# Patient Record
Sex: Female | Born: 1990 | Race: Black or African American | Hispanic: No | Marital: Married | Smoking: Never smoker
Health system: Southern US, Community
[De-identification: ages and names within clinical notes are randomized; demographics above are authoritative.]

## PROBLEM LIST (undated history)

## (undated) DIAGNOSIS — I499 Cardiac arrhythmia, unspecified: Secondary | ICD-10-CM

## (undated) DIAGNOSIS — Q250 Patent ductus arteriosus: Secondary | ICD-10-CM

## (undated) DIAGNOSIS — I10 Essential (primary) hypertension: Secondary | ICD-10-CM

## (undated) DIAGNOSIS — G4730 Sleep apnea, unspecified: Secondary | ICD-10-CM

## (undated) DIAGNOSIS — R011 Cardiac murmur, unspecified: Secondary | ICD-10-CM

## (undated) DIAGNOSIS — J45909 Unspecified asthma, uncomplicated: Secondary | ICD-10-CM

## (undated) DIAGNOSIS — O24419 Gestational diabetes mellitus in pregnancy, unspecified control: Secondary | ICD-10-CM

## (undated) HISTORY — DX: Patent ductus arteriosus: Q25.0

## (undated) HISTORY — DX: Essential (primary) hypertension: I10

## (undated) HISTORY — DX: Unspecified asthma, uncomplicated: J45.909

---

## 2011-05-22 ENCOUNTER — Inpatient Hospital Stay (INDEPENDENT_AMBULATORY_CARE_PROVIDER_SITE_OTHER)
Admission: RE | Admit: 2011-05-22 | Discharge: 2011-05-22 | Disposition: A | Discharge status: Home / Self Care | Payer: Self-pay | Source: Ambulatory Visit | Attending: Family Medicine | Admitting: Family Medicine

## 2011-05-22 DIAGNOSIS — J029 Acute pharyngitis, unspecified: Secondary | ICD-10-CM

## 2011-05-22 LAB — POCT RAPID STREP A: Streptococcus, Group A Screen (Direct): NEGATIVE

## 2011-05-22 LAB — POCT INFECTIOUS MONO SCREEN: Mono Screen: NEGATIVE

## 2011-08-10 ENCOUNTER — Ambulatory Visit: Payer: 59 | Attending: Family Medicine

## 2011-08-10 DIAGNOSIS — G4733 Obstructive sleep apnea (adult) (pediatric): Secondary | ICD-10-CM | POA: Insufficient documentation

## 2011-08-10 DIAGNOSIS — Z6836 Body mass index (BMI) 36.0-36.9, adult: Secondary | ICD-10-CM | POA: Insufficient documentation

## 2011-08-31 NOTE — Procedures (Signed)
Nicole Stuart, Nicole Stuart                ACCOUNT NO.:  [id number]  MEDICAL RECORD NO.:  [id number]          PATIENT TYPE:  OUT  LOCATION:  SLEEP LAB                     FACILITY:  [facility]  PHYSICIAN:  Scotty Weigelt A. Gerilyn Pilgrim, M.D. DATE OF BIRTH:  1990/10/06  DATE OF STUDY:  08/10/2011                           NOCTURNAL POLYSOMNOGRAM  REFERRING PHYSICIAN:  Renaye Rakers, M.D.  INDICATION:  21 year old who presents with snoring, fatigue and witnessed apnea.  The study is being done to evaluate for obstructive sleep apnea syndrome.  MEDICATIONS:  None.  Epworth Sleepiness Scale 4.  BMI 36.  ARCHITECTURAL SUMMARY:  This a split night recording with initial portion being a diagnostic and the second portion a titration recording. The total recording time is 439 minutes. Sleep efficiency 65%.  Sleep latency 22 minutes.  REM latency 324 minutes.  Risk factors; baseline oxygen saturation is 94, lowest saturation 85 during non-REM sleep. Diagnostic AHI is 17.3.  The patient was placed on positive pressure and titrated between 5 and 10.  She actually did well on low pressures of 7- 9 and pressure obtained she developed central apnea, optimal pressure is therefore 8.  Limb movements summary:  PLM index 0.     Electrocardiogram summary: The average heart rate is 84 with no significant dysrhythmias observed.  IMPRESSION:  Mild to moderate obstructive sleep apnea syndrome, which responds well to a CPAP of 8.  Thanks for this referral.   Dhruvi Crenshaw A. Gerilyn Pilgrim, M.D.    KAD/MEDQ  D:  08/31/2011 10:23:30  T:  08/31/2011 10:41:51  Job:  [id number]

## 2011-09-04 ENCOUNTER — Encounter (HOSPITAL_COMMUNITY): Payer: Self-pay | Admitting: Pharmacy Technician

## 2011-09-15 ENCOUNTER — Encounter (HOSPITAL_COMMUNITY): Payer: Self-pay

## 2011-09-15 ENCOUNTER — Encounter (HOSPITAL_COMMUNITY)
Admission: RE | Admit: 2011-09-15 | Discharge: 2011-09-15 | Disposition: A | Discharge status: Home / Self Care | Payer: 59 | Source: Ambulatory Visit | Attending: Otolaryngology | Admitting: Otolaryngology

## 2011-09-15 HISTORY — DX: Sleep apnea, unspecified: G47.30

## 2011-09-15 HISTORY — DX: Cardiac arrhythmia, unspecified: I49.9

## 2011-09-15 HISTORY — DX: Cardiac murmur, unspecified: R01.1

## 2011-09-15 LAB — CBC
Hemoglobin: 12.5 g/dL (ref 12.0–15.0)
MCHC: 35.6 g/dL (ref 30.0–36.0)
RDW: 15.2 % (ref 11.5–15.5)
WBC: 12.3 10*3/uL — ABNORMAL HIGH (ref 4.0–10.5)

## 2011-09-15 LAB — SURGICAL PCR SCREEN
MRSA, PCR: NEGATIVE
Staphylococcus aureus: NEGATIVE

## 2011-09-15 LAB — HCG, SERUM, QUALITATIVE: Preg, Serum: NEGATIVE

## 2011-09-15 NOTE — Pre-Procedure Instructions (Signed)
18 Nicole Stuart  09/15/2011   Your procedure is scheduled on:  09/22/11  Report to [facility] at 0530 AM.  Call this number if you have problems the morning of surgery: [phone]   Remember:   Do not eat food:After Midnight.  May have clear liquids: up to 4 Hours before arrival.  Clear liquids include soda, tea, black coffee, apple or grape juice, broth.  Take these medicines the morning of surgery with A SIP OF WATER: none   Do not wear jewelry, make-up or nail polish.  Do not wear lotions, powders, or perfumes. You may wear deodorant.  Do not shave 48 hours prior to surgery.  Do not bring valuables to the hospital.  Contacts, dentures or bridgework may not be worn into surgery.  Leave suitcase in the car. After surgery it may be brought to your room.  For patients admitted to the hospital, checkout time is 11:00 AM the day of discharge.   Patients discharged the day of surgery will not be allowed to drive home.  Name and phone number of your driver: Dewayne Hatch- mother- [phone]  Special Instructions: CHG Shower Use Special Wash: 1/2 bottle night before surgery and 1/2 bottle morning of surgery.   Please read over the following fact sheets that you were given: Pain Booklet, Coughing and Deep Breathing, MRSA Information and Surgical Site Infection Prevention

## 2011-09-15 NOTE — Progress Notes (Signed)
Contacted Dr. Allene Pyo office, spoke with Amy requested orders for PAT

## 2011-09-21 NOTE — H&P (Signed)
PREOPERATIVE H&P  Chief Complaint: large tonsils and sleep apnea  HPI: Nicole Stuart is a 21 y.o. female who presents for evaluation of tonsils and sleep apnea. She's always had large tonsils with a history of sore throats as well as recurrent strept infections. She also has trouble breathing through the left side of her nose. She recently had a sleep test that demonstrated mild to moderate sleep apnea. She is taken to the OR at this time for tonsillectomy, septoplasty and turbinate reductions.  Past Medical History  Diagnosis Date  . Dysrhythmia     first degree heart block.   Marland Kitchen Heart murmur   . Sleep apnea     study done two weeks ago [place]    No past surgical history on file. History   Social History  . Marital Status: Single    Spouse Name: N/A    Number of Children: N/A  . Years of Education: N/A   Social History Main Topics  . Smoking status: Never Smoker   . Smokeless tobacco: Not on file  . Alcohol Use: No  . Drug Use: No  . Sexually Active: Yes   Other Topics Concern  . Not on file   Social History Narrative  . No narrative on file   No family history on file. No Known Allergies Prior to Admission medications   Medication Sig Start Date End Date Taking? Authorizing Provider  amoxicillin-clavulanate (AUGMENTIN) 875-125 MG per tablet Take 1 tablet by mouth 2 (two) times daily. For 10 days; Start date 09/03/11   Yes Historical Provider, MD  ibuprofen (ADVIL,MOTRIN) 600 MG tablet Take 600 mg by mouth every 6 (six) hours as needed. For pain   Yes Historical Provider, MD     Positive ROS: trouble breathing through the L side of nose.  All other systems have been reviewed and were otherwise negative with the exception of those mentioned in the HPI and as above.  Physical Exam: There were no vitals filed for this visit.  General: Alert, no acute distress Oral: Normal oral mucosa and 3+ tonsils bilaterally Nasal: septal deviation to the left with turbinate  enlargement Neck: No palpable adenopathy or thyroid nodules Ear: Ear canal is clear with normal appearing TMs Cardiovascular: Regular rate and rhythm, no murmur.  Respiratory: Clear to auscultation Neurologic: Alert and oriented x 3   Assessment/Plan: obstructive sleep apnea, turbinate hypertrophy, acute tonsillitis, septal deviation to the left Plan for Procedure(s): NASAL SEPTOPLASTY WITH TURBINATE REDUCTION TONSILLECTOMY   Joeanna Howdyshell E, MD 09/21/2011 6:02 PM

## 2011-09-22 ENCOUNTER — Encounter (HOSPITAL_COMMUNITY)
Admission: RE | Disposition: A | Discharge status: Home / Self Care | Payer: Self-pay | Source: Ambulatory Visit | Attending: Otolaryngology

## 2011-09-22 ENCOUNTER — Ambulatory Visit (HOSPITAL_COMMUNITY): Payer: 59 | Admitting: Certified Registered"

## 2011-09-22 ENCOUNTER — Ambulatory Visit (HOSPITAL_COMMUNITY)
Admission: RE | Admit: 2011-09-22 | Discharge: 2011-09-23 | Disposition: A | Discharge status: Home / Self Care | Payer: 59 | Source: Ambulatory Visit | Attending: Otolaryngology | Admitting: Otolaryngology

## 2011-09-22 ENCOUNTER — Encounter (HOSPITAL_COMMUNITY): Payer: Self-pay | Admitting: *Deleted

## 2011-09-22 ENCOUNTER — Other Ambulatory Visit: Payer: Self-pay | Admitting: Otolaryngology

## 2011-09-22 ENCOUNTER — Encounter (HOSPITAL_COMMUNITY): Payer: Self-pay | Admitting: Certified Registered"

## 2011-09-22 DIAGNOSIS — J342 Deviated nasal septum: Secondary | ICD-10-CM | POA: Insufficient documentation

## 2011-09-22 DIAGNOSIS — Z01812 Encounter for preprocedural laboratory examination: Secondary | ICD-10-CM | POA: Insufficient documentation

## 2011-09-22 DIAGNOSIS — J3501 Chronic tonsillitis: Secondary | ICD-10-CM | POA: Insufficient documentation

## 2011-09-22 DIAGNOSIS — G4733 Obstructive sleep apnea (adult) (pediatric): Secondary | ICD-10-CM | POA: Insufficient documentation

## 2011-09-22 HISTORY — PX: TONSILLECTOMY: SHX5217

## 2011-09-22 HISTORY — PX: NASAL SEPTOPLASTY W/ TURBINOPLASTY: SHX2070

## 2011-09-22 SURGERY — SEPTOPLASTY, NOSE, WITH NASAL TURBINATE REDUCTION
Anesthesia: General | Site: Nose | Laterality: Bilateral | Wound class: Clean Contaminated

## 2011-09-22 MED ORDER — HYDROCODONE-ACETAMINOPHEN 7.5-500 MG/15ML PO SOLN
15.0000 mL | ORAL | Status: DC | PRN
  Administered 2011-09-22: 15 mL via ORAL
  Administered 2011-09-23: 20 mL via ORAL
  Administered 2011-09-23 (×2): 15 mL via ORAL
  Filled 2011-09-22: qty 30
  Filled 2011-09-22 (×4): qty 15

## 2011-09-22 MED ORDER — DEXAMETHASONE SODIUM PHOSPHATE 4 MG/ML IJ SOLN
INTRAMUSCULAR | Status: DC | PRN
  Administered 2011-09-22: 10 mg via INTRAVENOUS

## 2011-09-22 MED ORDER — GLYCOPYRROLATE 0.2 MG/ML IJ SOLN
INTRAMUSCULAR | Status: DC | PRN
  Administered 2011-09-22: 0.2 mg via INTRAVENOUS

## 2011-09-22 MED ORDER — DROPERIDOL 2.5 MG/ML IJ SOLN
INTRAMUSCULAR | Status: DC | PRN
  Administered 2011-09-22: 0.625 mg via INTRAVENOUS

## 2011-09-22 MED ORDER — CEFAZOLIN SODIUM 1-5 GM-% IV SOLN
INTRAVENOUS | Status: AC
  Filled 2011-09-22: qty 100

## 2011-09-22 MED ORDER — OXYMETAZOLINE HCL 0.05 % NA SOLN
NASAL | Status: DC | PRN
  Administered 2011-09-22: 1

## 2011-09-22 MED ORDER — SUFENTANIL CITRATE 50 MCG/ML IV SOLN
INTRAVENOUS | Status: DC | PRN
  Administered 2011-09-22: 20 ug via INTRAVENOUS
  Administered 2011-09-22: 10 ug via INTRAVENOUS
  Administered 2011-09-22: 20 ug via INTRAVENOUS

## 2011-09-22 MED ORDER — MIDAZOLAM HCL 5 MG/5ML IJ SOLN
INTRAMUSCULAR | Status: DC | PRN
  Administered 2011-09-22: 2 mg via INTRAVENOUS

## 2011-09-22 MED ORDER — DROPERIDOL 2.5 MG/ML IJ SOLN: 0.6250 mg | INTRAMUSCULAR | Status: DC | PRN

## 2011-09-22 MED ORDER — DEXTROSE 5 % IV SOLN
1.0000 g | INTRAVENOUS | Status: DC | PRN
  Administered 2011-09-22: 2 g via INTRAVENOUS

## 2011-09-22 MED ORDER — ACETAMINOPHEN 650 MG RE SUPP: 650.0000 mg | RECTAL | Status: DC | PRN

## 2011-09-22 MED ORDER — PROPOFOL 10 MG/ML IV EMUL
INTRAVENOUS | Status: DC | PRN
  Administered 2011-09-22: 200 mg via INTRAVENOUS
  Administered 2011-09-22: 20 mg via INTRAVENOUS

## 2011-09-22 MED ORDER — CEFAZOLIN SODIUM 1-5 GM-% IV SOLN
1.0000 g | Freq: Three times a day (TID) | INTRAVENOUS | Status: DC
  Administered 2011-09-22 – 2011-09-23 (×3): 1 g via INTRAVENOUS
  Filled 2011-09-22 (×5): qty 50

## 2011-09-22 MED ORDER — ACETAMINOPHEN 160 MG/5ML PO SOLN: 650.0000 mg | ORAL | Status: DC | PRN

## 2011-09-22 MED ORDER — MORPHINE SULFATE 2 MG/ML IJ SOLN
2.0000 mg | INTRAMUSCULAR | Status: DC | PRN
  Administered 2011-09-22 (×3): 2 mg via INTRAVENOUS
  Filled 2011-09-22 (×3): qty 1

## 2011-09-22 MED ORDER — ONDANSETRON HCL 4 MG/2ML IJ SOLN: 4.0000 mg | INTRAMUSCULAR | Status: DC | PRN

## 2011-09-22 MED ORDER — KCL IN DEXTROSE-NACL 20-5-0.45 MEQ/L-%-% IV SOLN
INTRAVENOUS | Status: DC
  Administered 2011-09-22 – 2011-09-23 (×2): via INTRAVENOUS
  Filled 2011-09-22 (×4): qty 1000

## 2011-09-22 MED ORDER — LIDOCAINE HCL 4 % MT SOLN
OROMUCOSAL | Status: DC | PRN
  Administered 2011-09-22: 4 mL via TOPICAL

## 2011-09-22 MED ORDER — HYDROMORPHONE HCL PF 1 MG/ML IJ SOLN: 0.2500 mg | INTRAMUSCULAR | Status: DC | PRN

## 2011-09-22 MED ORDER — PHENOL 1.4 % MT LIQD
1.0000 | OROMUCOSAL | Status: DC | PRN
  Administered 2011-09-22: 1 via OROMUCOSAL
  Filled 2011-09-22: qty 177

## 2011-09-22 MED ORDER — BACITRACIN ZINC 500 UNIT/GM EX OINT
TOPICAL_OINTMENT | CUTANEOUS | Status: DC | PRN
  Administered 2011-09-22: 1 via TOPICAL

## 2011-09-22 MED ORDER — ROCURONIUM BROMIDE 100 MG/10ML IV SOLN
INTRAVENOUS | Status: DC | PRN
  Administered 2011-09-22: 50 mg via INTRAVENOUS

## 2011-09-22 MED ORDER — LACTATED RINGERS IV SOLN
INTRAVENOUS | Status: DC | PRN
  Administered 2011-09-22 (×2): via INTRAVENOUS

## 2011-09-22 MED ORDER — ONDANSETRON HCL 4 MG PO TABS: 4.0000 mg | ORAL_TABLET | ORAL | Status: DC | PRN

## 2011-09-22 MED ORDER — MORPHINE SULFATE 4 MG/ML IJ SOLN: 2.0000 mg | INTRAMUSCULAR | Status: DC | PRN

## 2011-09-22 MED ORDER — LIDOCAINE-EPINEPHRINE 1 %-1:100000 IJ SOLN
INTRAMUSCULAR | Status: DC | PRN
  Administered 2011-09-22: 10 mL

## 2011-09-22 MED ORDER — ONDANSETRON HCL 4 MG/2ML IJ SOLN
INTRAMUSCULAR | Status: DC | PRN
  Administered 2011-09-22: 4 mg via INTRAVENOUS

## 2011-09-22 SURGICAL SUPPLY — 54 items
BLADE INF TURB ROT M4 2 5PK (BLADE) ×3 IMPLANT
CANISTER SUCTION 2500CC (MISCELLANEOUS) ×6 IMPLANT
CATH ROBINSON RED A/P 12FR (CATHETERS) IMPLANT
CLEANER TIP ELECTROSURG 2X2 (MISCELLANEOUS) ×3 IMPLANT
CLOTH BEACON ORANGE TIMEOUT ST (SAFETY) ×3 IMPLANT
COAGULATOR SUCT 8FR VV (MISCELLANEOUS) ×3 IMPLANT
COAGULATOR SUCT SWTCH 10FR 6 (ELECTROSURGICAL) ×3 IMPLANT
CONT SPECI 4OZ STER CLIK (MISCELLANEOUS) ×6 IMPLANT
DRAPE PROXIMA HALF (DRAPES) IMPLANT
DRESSING NASAL KENNEDY 3.5X.9 (MISCELLANEOUS) ×2 IMPLANT
DRESSING TELFA 8X3 (GAUZE/BANDAGES/DRESSINGS) IMPLANT
DRSG NASAL KENNEDY 3.5X.9 (MISCELLANEOUS) ×3
ELECT COATED BLADE 2.86 ST (ELECTRODE) ×3 IMPLANT
ELECT REM PT RETURN 9FT ADLT (ELECTROSURGICAL) ×3
ELECT REM PT RETURN 9FT PED (ELECTROSURGICAL)
ELECTRODE REM PT RETRN 9FT PED (ELECTROSURGICAL) IMPLANT
ELECTRODE REM PT RTRN 9FT ADLT (ELECTROSURGICAL) ×2 IMPLANT
GAUZE SPONGE 2X2 8PLY STRL LF (GAUZE/BANDAGES/DRESSINGS) IMPLANT
GAUZE SPONGE 4X4 16PLY XRAY LF (GAUZE/BANDAGES/DRESSINGS) IMPLANT
GLOVE BIOGEL PI IND STRL 7.0 (GLOVE) ×2 IMPLANT
GLOVE BIOGEL PI INDICATOR 7.0 (GLOVE) ×1
GLOVE ECLIPSE 6.5 STRL STRAW (GLOVE) ×3 IMPLANT
GLOVE SS BIOGEL STRL SZ 7.5 (GLOVE) ×4 IMPLANT
GLOVE SUPERSENSE BIOGEL SZ 7.5 (GLOVE) ×2
GOWN PREVENTION PLUS XLARGE (GOWN DISPOSABLE) ×3 IMPLANT
GOWN STRL NON-REIN LRG LVL3 (GOWN DISPOSABLE) ×3 IMPLANT
INFERIOR TURBINATE BLADE ×3 IMPLANT
KIT BASIN OR (CUSTOM PROCEDURE TRAY) ×3 IMPLANT
KIT ROOM TURNOVER OR (KITS) ×3 IMPLANT
NEEDLE 27GAX1X1/2 (NEEDLE) ×3 IMPLANT
NS IRRIG 1000ML POUR BTL (IV SOLUTION) ×3 IMPLANT
PACK SURGICAL SETUP 50X90 (CUSTOM PROCEDURE TRAY) ×3 IMPLANT
PAD ARMBOARD 7.5X6 YLW CONV (MISCELLANEOUS) ×6 IMPLANT
PATTIES SURGICAL .5 X3 (DISPOSABLE) ×3 IMPLANT
PENCIL FOOT CONTROL (ELECTRODE) ×3 IMPLANT
SPECIMEN JAR SMALL (MISCELLANEOUS) ×6 IMPLANT
SPLINT NASAL DOYLE BI-VL (GAUZE/BANDAGES/DRESSINGS) IMPLANT
SPONGE GAUZE 2X2 STER 10/PKG (GAUZE/BANDAGES/DRESSINGS)
SPONGE TONSIL 1 RF SGL (DISPOSABLE) IMPLANT
STRIP CLOSURE SKIN 1/2X4 (GAUZE/BANDAGES/DRESSINGS) IMPLANT
SUT CHROMIC 3 0 PS 2 (SUTURE) IMPLANT
SUT CHROMIC 4 0 P 3 18 (SUTURE) ×3 IMPLANT
SUT CHROMIC 4 0 PS 2 18 (SUTURE) ×3 IMPLANT
SUT CHROMIC 5 0 P 3 (SUTURE) IMPLANT
SUT ETHILON 3 0 PS 1 (SUTURE) IMPLANT
SUT ETHILON 4 0 PS 2 18 (SUTURE) IMPLANT
SUT ETHILON 5 0 P 3 18 (SUTURE)
SUT NYLON ETHILON 5-0 P-3 1X18 (SUTURE) IMPLANT
SUT SILK 2 0 FS (SUTURE) ×3 IMPLANT
SYR BULB 3OZ (MISCELLANEOUS) IMPLANT
TOWEL OR 17X24 6PK STRL BLUE (TOWEL DISPOSABLE) ×6 IMPLANT
TRAY ENT MC OR (CUSTOM PROCEDURE TRAY) ×3 IMPLANT
TUBE CONNECTING 12X1/4 (SUCTIONS) ×6 IMPLANT
WATER STERILE IRR 1000ML POUR (IV SOLUTION) IMPLANT

## 2011-09-22 NOTE — Transfer of Care (Signed)
Immediate Anesthesia Transfer of Care Note  Patient: Nicole Stuart  Procedure(s) Performed:  NASAL SEPTOPLASTY WITH TURBINATE REDUCTION; TONSILLECTOMY  Patient Location: PACU  Anesthesia Type: General  Level of Consciousness: awake, oriented, sedated, patient cooperative and responds to stimulation  Airway & Oxygen Therapy: Patient Spontanous Breathing and Patient connected to face mask oxygen  Post-op Assessment: Report given to PACU RN, Post -op Vital signs reviewed and stable and Patient moving all extremities  Post vital signs: Reviewed and stable  Complications: No apparent anesthesia complications

## 2011-09-22 NOTE — Brief Op Note (Signed)
09/22/2011  9:47 AM  PATIENT:  Nicole Stuart  21 y.o. female  PRE-OPERATIVE DIAGNOSIS:  obstructive sleep apnea, turbinate hypertrophy, acute tonsillitis,septal deviation  POST-OPERATIVE DIAGNOSIS:  obstructive sleep apnea, turbinate hypertrophy, acute tonsillitis,septal deviation  PROCEDURE:  Procedure(s): NASAL SEPTOPLASTY WITH TURBINATE REDUCTION TONSILLECTOMY AND ADENOIDECTOMY  SURGEON:  Surgeon(s): Drema Halon, MD  PHYSICIAN ASSISTANT:   ASSISTANTS: none   ANESTHESIA:   general  EBL:  Total I/O In: 1000 [I.V.:1000] Out: -   BLOOD ADMINISTERED:none  DRAINS: none   LOCAL MEDICATIONS USED:  XYLOCAINE w/ epi 10CC  SPECIMEN:  Source of Specimen:  tonsils  DISPOSITION OF SPECIMEN:  PATHOLOGY  COUNTS:  YES  TOURNIQUET:  * No tourniquets in log *  DICTATION: .Other Dictation: Dictation Number [id number]  PLAN OF CARE: Admit for overnight observation  PATIENT DISPOSITION:  PACU - hemodynamically stable.   Delay start of Pharmacological VTE agent (>24hrs) due to surgical blood loss or risk of bleeding:  {YES/NO/NOT APPLICABLE:20182

## 2011-09-22 NOTE — Interval H&P Note (Signed)
History and Physical Interval Note:  09/22/2011 7:34 AM  Nicole Stuart  has presented today for surgery, with the diagnosis of obstructive sleep apnea, turbinate hypertrophy, acute tonsillitis  The various methods of treatment have been discussed with the patient and family. After consideration of risks, benefits and other options for treatment, the patient has consented to  Procedure(s): NASAL SEPTOPLASTY WITH TURBINATE REDUCTION TONSILLECTOMY as a surgical intervention .  The patients' history has been reviewed, patient examined, no change in status, stable for surgery.  I have reviewed the patients' chart and labs.  Questions were answered to the patient's satisfaction.     [name] E

## 2011-09-22 NOTE — Anesthesia Postprocedure Evaluation (Signed)
Anesthesia Post Note  Patient: Nicole Stuart  Procedure(s) Performed:  NASAL SEPTOPLASTY WITH TURBINATE REDUCTION; TONSILLECTOMY  Anesthesia type: general  Patient location: PACU  Post pain: Pain level controlled  Post assessment: Patient's Cardiovascular Status Stable  Last Vitals:  Filed Vitals:   09/22/11 1000  BP:   Pulse:   Temp: 36.4 C  Resp:     Post vital signs: Reviewed and stable  Level of consciousness: sedated  Complications: No apparent anesthesia complications

## 2011-09-22 NOTE — Anesthesia Preprocedure Evaluation (Signed)
Anesthesia Evaluation  Patient identified by MRN, date of birth, ID band  Reviewed: Allergy & Precautions, H&P , NPO status , Patient's Chart, lab work & pertinent test results, reviewed documented beta blocker date and time   History of Anesthesia Complications Negative for: history of anesthetic complications  Airway       Dental   Pulmonary sleep apnea ,  clear to auscultation  Pulmonary exam normal       Cardiovascular + dysrhythmias Regular - Systolic murmurs    Neuro/Psych Negative Neurological ROS     GI/Hepatic negative GI ROS, Neg liver ROS,   Endo/Other  Morbid obesity  Renal/GU negative Renal ROS     Musculoskeletal   Abdominal   Peds  Hematology   Anesthesia Other Findings   Reproductive/Obstetrics                           Anesthesia Physical Anesthesia Plan  ASA: III  Anesthesia Plan: General   Post-op Pain Management:    Induction: Intravenous  Airway Management Planned: Oral ETT  Additional Equipment:   Intra-op Plan:   Post-operative Plan: Extubation in OR  Informed Consent: I have reviewed the patients History and Physical, chart, labs and discussed the procedure including the risks, benefits and alternatives for the proposed anesthesia with the patient or authorized representative who has indicated his/her understanding and acceptance.     Plan Discussed with:   Anesthesia Plan Comments:         Anesthesia Quick Evaluation

## 2011-09-23 MED ORDER — HYDROCODONE-ACETAMINOPHEN 7.5-500 MG/15ML PO SOLN: 15.0000 mL | ORAL | Status: AC | PRN

## 2011-09-23 MED ORDER — AMOXICILLIN 400 MG/5ML PO SUSR: 600.0000 mg | Freq: Two times a day (BID) | ORAL | Status: AC

## 2011-09-23 NOTE — Progress Notes (Signed)
POD 1 VSS Doing well with minimal c/o. Nasal packing removed. Sats good. No significant bleeding. Discharge home. F/U in office in 2 weeks. D/C meds Lortab and Amox 500bid

## 2011-09-23 NOTE — Progress Notes (Signed)
09/23/2011 10:43 AM  Discussed discharge instructions and medications with patient and family member. Answered all questions. Vss. Pt has no complaints of pain. Surgical site is WNL. All IV's are dc'ed with tips intact. Pt discharged home with family.  Celesta Gentile 2/5/201310:43 AM

## 2011-09-23 NOTE — Op Note (Signed)
[name], [name]                ACCOUNT NO.:  [id number]  MEDICAL RECORD NO.:  [id number]  LOCATION:  3310                         FACILITY:  [facility]  PHYSICIAN:  Kristine Garbe. Ezzard Standing, M.D.DATE OF BIRTH:  1990-10-24  DATE OF PROCEDURE:  09/22/2011 DATE OF DISCHARGE:                              OPERATIVE REPORT   PREOPERATIVE DIAGNOSES: 1. Nasal obstruction with septal deviation to the left and turbinate     hypertrophy. 2. Obstructive sleep apnea with tonsillar hypertrophy and history of     recurrent tonsillitis.  POSTOPERATIVE DIAGNOSES: 1. Nasal obstruction with septal deviation to the left and turbinate     hypertrophy. 2. Obstructive sleep apnea with tonsillar hypertrophy and history of     recurrent tonsillitis.  OPERATION PERFORMED:  Septoplasty with bilateral inferior turbinate reductions.  Tonsillectomy. Adenoidectomy.  SURGEON:  Kristine Garbe. Ezzard Standing, M.D.  ANESTHESIA:  General endotracheal.  COMPLICATIONS:  None.  BRIEF CLINICAL NOTE:  Nicole Stuart is a 21 year old female, who has always had enlarged tonsils, who has had a history of recurrent strep throat.  She was originally underwent a sleep test, which showed mild-to- moderate obstructive sleep apnea.  She also has nasal obstructions especially on the left side where she has a septal deviation to the left.  On oral exam, she has large 3+ almost kissing tonsils.  Because of her obstructive breathing pattern, nasal obstruction and history of obstructive sleep apnea, she is taken to the operating room at this time for septoplasty and turbinate reductions and tonsillectomy.  DESCRIPTION OF PROCEDURE:  After adequate endotracheal anesthesia, the patient received 2 g Ancef IV preoperatively as well as 10 mg of Decadron.  The nose was examined first.  On nasal exam, she has a septal deviation to the left with the cartilaginous septum bowed off of the maxillary crest into the left airway and then posteriorly  some of the shadowed bony spur on the left.  The right side was fairly clear, which was her better breathing side.  She had moderate turbinate hypertrophy. An hemitransfixion incision was made along the left side of the caudal edge of septum.  Mucoperichondrial/mucoperiosteal flaps were elevated posteriorly.  The portion of the cartilaginous septum that bowed off of the crest into the left airway was removed.  Then, a vertical incision was made just anterior to the bony septum posteriorly and mucoperiosteal flaps were elevated on either side of the septal spur that was removed. This substantially improved the left airway.  The hemitransfixion incision was closed with interrupted 4-0 chromic sutures and then the septum was basted with a 4-0 chromic suture arm.  Next, using the Elmed turbinate microdebrider blade, submucosal reduction of the inferior turbinate was performed bilaterally.  After performing submucosal reduction of the turbinates, the turbinates were outfractured.  This completed the septoplasty and turbinate portion of the procedure.  The nose was packed with Telfa soaked in bacitracin ointment bilaterally.  Next portion was started, a mouth-gag was used to expose the oropharynx and the tonsils were removed.  She had very large tonsils, impinging on the uvula.  The tonsils were removed from the tonsillar fossa along with the anterior tonsillar pillar.  The tonsil was  sent to Pathology. Hemostasis was obtained with cautery.  The oropharynx was again irrigated with saline.  Following removal of tonsils, we had very edematous and elongated uvula, and the uvula was transected at its base with a cautery.  This completed the tonsillectomy.  Next, a palate retractor was used to examine the palate and the adenoid tissue was examined.  Moderate adenoid tissue suctioned.  Cautery was used to reduce the size of adenoid pad.  This completed the procedure. Zadie was awoke from anesthesia  and transferred to recovery room, postop doing well.  DISPOSITION:  She will be admitted to the Step-Down Unit for observation because of sleep apnea.  We will plan on removing the nasal packs in the morning and discharging her home on amoxicillin suspension 5 mg b.i.d. for 1 week; Tylenol, Lortab Elixir one to one-and-half tablespoons q.4 hours p.r.n. pain.          ______________________________ Kristine Garbe. Ezzard Standing, M.D.     CEN/MEDQ  D:  09/22/2011  T:  09/23/2011  Job:  [id number]  cc:   Renaye Rakers, M.D.

## 2011-09-24 ENCOUNTER — Encounter (HOSPITAL_COMMUNITY): Payer: Self-pay | Admitting: Otolaryngology

## 2011-09-25 MED FILL — Morphine Sulfate Inj 2 MG/ML: INTRAMUSCULAR | Qty: 1 | Status: AC

## 2014-10-11 DIAGNOSIS — E669 Obesity, unspecified: Secondary | ICD-10-CM | POA: Insufficient documentation

## 2014-10-11 DIAGNOSIS — R011 Cardiac murmur, unspecified: Secondary | ICD-10-CM | POA: Insufficient documentation

## 2014-10-31 ENCOUNTER — Encounter: Payer: 59 | Attending: Family Medicine | Admitting: Dietician

## 2014-10-31 ENCOUNTER — Encounter: Payer: Self-pay | Admitting: Dietician

## 2014-10-31 DIAGNOSIS — Z6841 Body Mass Index (BMI) 40.0 and over, adult: Secondary | ICD-10-CM | POA: Insufficient documentation

## 2014-10-31 DIAGNOSIS — Z713 Dietary counseling and surveillance: Secondary | ICD-10-CM | POA: Insufficient documentation

## 2014-10-31 NOTE — Progress Notes (Signed)
  Medical Nutrition Therapy:  Appt start time: 0930 end time:  1015.   Assessment:  Primary concerns today: Nicole Stuart is here since her BMI is 40.9 and she is trying to lose weight. Hgb A1c is 5.9%. Weight was 236 lbs in 10/15 and has lost about 30 lbs since then. Started doing Zumba to help with weight and stopped drinking sodas, though still drinking juice. Not sure which kind of snacks to eat. Recently changed jobs where now she is sitting at a desk.   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 Lives with her fiance, who is not interested in eating healthy. She does the foods shopping and meal preparation. Eats some meals at [employer] house. Does not eat breakfast since she does not have enough time. Used to eat out more but not eats out all day each Saturday.    Just started avoiding starchy carbs in the last 2 weeks.   Preferred Learning Style:   No preference indicated   Learning Readiness:   Ready  MEDICATIONS: Vitamin D   DIETARY INTAKE:  Usual eating pattern includes 2 meals and 2 snacks per day.  Avoided foods include avoiding pasta, bread, potatoes, or rice for now, peanut butter  24-hr recall:  B ( AM): none Snk ( AM): none  L ( PM): salad with chicken and ranch  Snk ( PM): pretzels or chips from vending machine or [employer] on Saturday D ( PM): tacos or chicken broccoli or cheese Snk ( PM): almonds with flavors Beverages: water with Mio drops, drinks about 33 oz juice Cranberry or Lemonade  Usual physical activity:Zumba 2 x week  Estimated energy needs: 2000 calories 225 g carbohydrates 150 g protein 56 g fat  Progress Towards Goal(s):  In progress.   Nutritional Diagnosis:  [place]-3.3 Overweight/obesity As related to hx of meal skipping, large portion sizes, and excess juice consumption.  As evidenced by BMI of 40.9.    Intervention:  Nutrition counseling provided. Recommended that Nicole Stuart drink sugar free drinks instead of juice.  Plan: Aim to eat 3  meals per day and 2-3 snacks if needed. For breakfast try a [facility] Protein bar or a boiled egg with fruit or yogurt. Aim to fill half of your plate with vegetables at lunch and dinner. Have lean protein the size of the palm of your hand and starch/carbs the size of the quarter of your plate. Have protein with carbs for snacks. Portion out snacks. Use a small plate for dinner. Eat meals at the table with no TV. Take 20 minutes to eat (chew 20-30 x per bite). Have seconds if you are hungry after 20 minutes.  Aim walk at breaks at work (about 30 minutes) most days.   Teaching Method Utilized:  Visual Auditory Hands on  Handouts given during visit include:  MyPlate Handout  15 g CHO Snacks  Meal Card  Barriers to learning/adherence to lifestyle change: none  Demonstrated degree of understanding via:  Teach Back   Monitoring/Evaluation:  Dietary intake, exercise, and body weight prn.

## 2014-10-31 NOTE — Patient Instructions (Addendum)
Aim to eat 3 meals per day and 2-3 snacks if needed. For breakfast try a [facility] Protein bar or a boiled egg with fruit or yogurt. Aim to fill half of your plate with vegetables at lunch and dinner. Have lean protein the size of the palm of your hand and starch/carbs the size of the quarter of your plate. Have protein with carbs for snacks. Portion out snacks. Use a small plate for dinner. Eat meals at the table with no TV. Take 20 minutes to eat (chew 20-30 x per bite). Have seconds if you are hungry after 20 minutes.  Aim walk at breaks at work (about 30 minutes) most days.

## 2014-12-05 ENCOUNTER — Other Ambulatory Visit: Payer: Self-pay | Admitting: Obstetrics and Gynecology

## 2014-12-06 LAB — CYTOLOGY - PAP

## 2015-12-18 MED FILL — TINIDAZOLE 500 MG TABLET: 500 | 5 days supply | Qty: 10 | Fill #0

## 2017-09-18 MED FILL — PROGESTERONE 200 MG CAPSULE: 200 | 10 days supply | Qty: 10 | Fill #0

## 2017-09-18 MED FILL — CLOMIPHENE CITRATE 50 MG TA: 50 | 5 days supply | Qty: 5 | Fill #0

## 2017-10-15 ENCOUNTER — Other Ambulatory Visit (HOSPITAL_COMMUNITY): Payer: Self-pay | Admitting: Obstetrics and Gynecology

## 2017-10-15 DIAGNOSIS — Z3141 Encounter for fertility testing: Secondary | ICD-10-CM

## 2017-10-23 ENCOUNTER — Encounter (HOSPITAL_COMMUNITY): Payer: Self-pay

## 2017-10-23 ENCOUNTER — Ambulatory Visit (HOSPITAL_COMMUNITY)
Admission: RE | Admit: 2017-10-23 | Discharge: 2017-10-23 | Disposition: A | Discharge status: Home / Self Care | Payer: PRIVATE HEALTH INSURANCE | Source: Ambulatory Visit | Attending: Obstetrics and Gynecology | Admitting: Obstetrics and Gynecology

## 2017-10-23 DIAGNOSIS — Z3141 Encounter for fertility testing: Secondary | ICD-10-CM | POA: Insufficient documentation

## 2017-10-23 MED ORDER — IOPAMIDOL (ISOVUE-300) INJECTION 61%
30.0000 mL | Freq: Once | INTRAVENOUS | Status: AC | PRN
  Administered 2017-10-23: 3 mL

## 2017-11-05 MED FILL — metroNIDAZOLE 500 MG TABS: 500 | 7 days supply | Qty: 14 | Fill #0

## 2017-11-13 MED FILL — CLOMIPHENE CITRATE 50 MG TA: 50 | 5 days supply | Qty: 10 | Fill #0

## 2017-12-16 MED FILL — METFORMIN HCL ER 500 MG TAB: 500 | 30 days supply | Qty: 30 | Fill #0

## 2017-12-16 MED FILL — CLOMIPHENE CITRATE 50 MG TA: 50 | 5 days supply | Qty: 10 | Fill #0

## 2018-01-14 MED FILL — CLOMIPHENE CITRATE 50 MG TA: 50 | 5 days supply | Qty: 10 | Fill #0

## 2018-01-14 MED FILL — METFORMIN HCL ER 750 MG TAB: 750 | 30 days supply | Qty: 30 | Fill #0

## 2018-04-29 MED FILL — SAXENDA 18 MG/3 ML PEN: 18 | 30 days supply | Qty: 15 | Fill #0

## 2018-04-29 MED FILL — UNIFINE PENTIPS 32GX5/32": 32G X 4 MM | 90 days supply | Qty: 100 | Fill #0

## 2018-04-29 MED FILL — UNIFINE PENTIPS 32GX5/32: 32G X 4 MM | 90 days supply | Qty: 100 | Fill #0

## 2018-05-06 MED FILL — metFORMIN HCL ER 750 MG TB2: 750 | 90 days supply | Qty: 90 | Fill #0

## 2018-06-21 MED FILL — SAXENDA 18 MG/3 ML PEN: 18 | 30 days supply | Qty: 15 | Fill #0

## 2018-08-06 MED FILL — FEMYNOR 0.25-35 MG-MCG TABS: 0.25-35 | 28 days supply | Qty: 28 | Fill #0

## 2018-09-06 MED FILL — FEMYNOR 0.25-35 MG-MCG TABS: 0.25-35 | 28 days supply | Qty: 28 | Fill #0

## 2018-09-09 MED FILL — CMPD 1:1:1:3MAX:DPH:NYS:LID: 7 days supply | Qty: 840 | Fill #0

## 2018-10-01 MED FILL — DOXYCYCLINE HYCLATE 100 MG: 100 | 10 days supply | Qty: 20 | Fill #0

## 2018-11-15 MED FILL — QSYMIA 3.75 MG-23 MG CAP: 3.75-23 | 14 days supply | Qty: 14 | Fill #0

## 2018-11-15 MED FILL — metFORMIN HCL ER 750 MG TB2: 750 | 90 days supply | Qty: 90 | Fill #1

## 2018-11-16 MED FILL — HYDROCHLOROTHIAZIDE 25 MG T: 25 | 30 days supply | Qty: 30 | Fill #0

## 2018-11-30 MED FILL — BENAZEPRIL-HYDROCHLOROTHIAZ: 10-12.5 | 30 days supply | Qty: 30 | Fill #0

## 2018-11-30 MED FILL — QSYMIA 7.5 MG-46 MG CAPSULE: 7.5-46 | 30 days supply | Qty: 30 | Fill #0

## 2019-01-28 IMAGING — RF DG HYSTEROGRAM
4 series · 4 of 4 positions shown · non-contrast
Comparison: None.

CLINICAL DATA: Fertility testing

EXAM:
HYSTEROSALPINGOGRAM
TECHNIQUE: Hysterosalpingogram was performed by the ordering physician under
fluoroscopy. Fluoroscopic images were submitted for radiologic
interpretation following the procedure. Please see the procedural
report for the amount of contrast and the fluoroscopy time utilized.

[Series 1: run · 1 of 1 slices shown (1 of 4)]
[im 1/1]
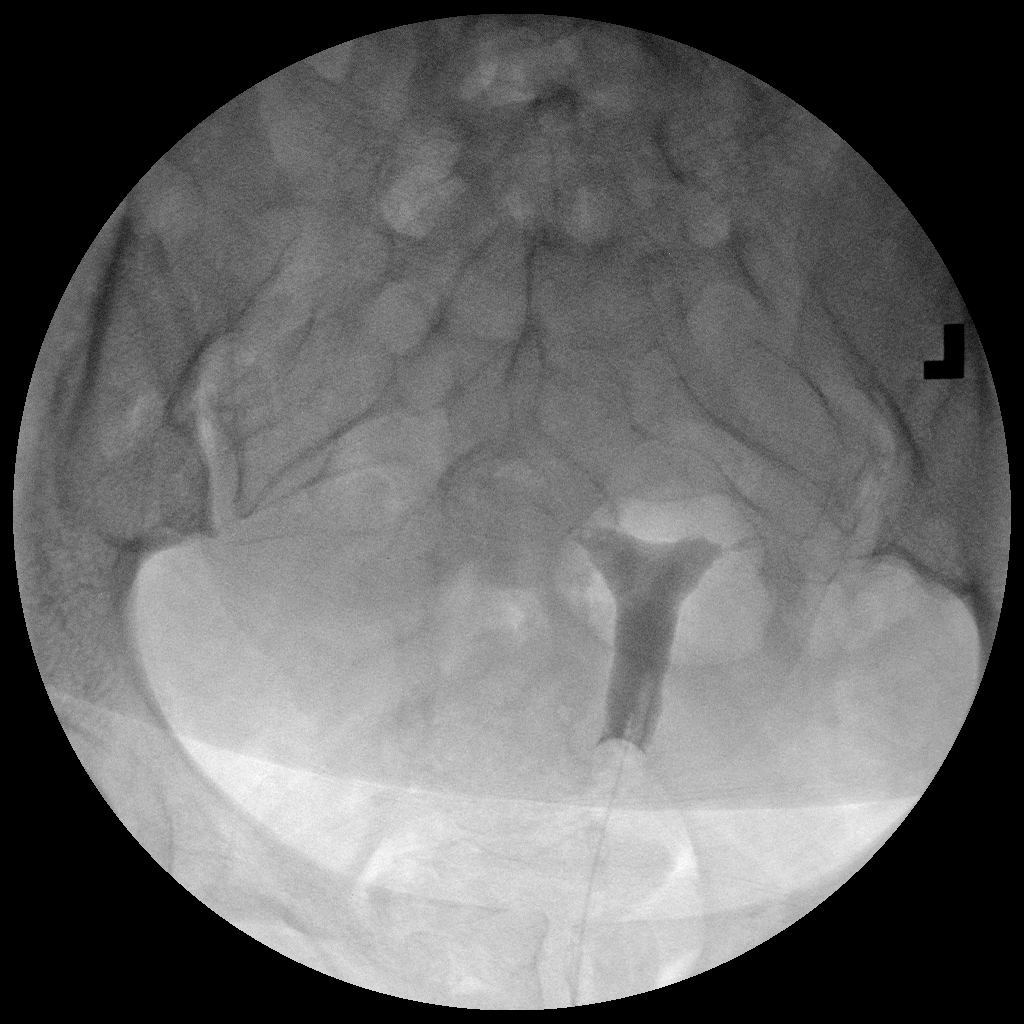

[Series 2: run · 1 of 1 slices shown (2 of 4)]
[im 1/1]
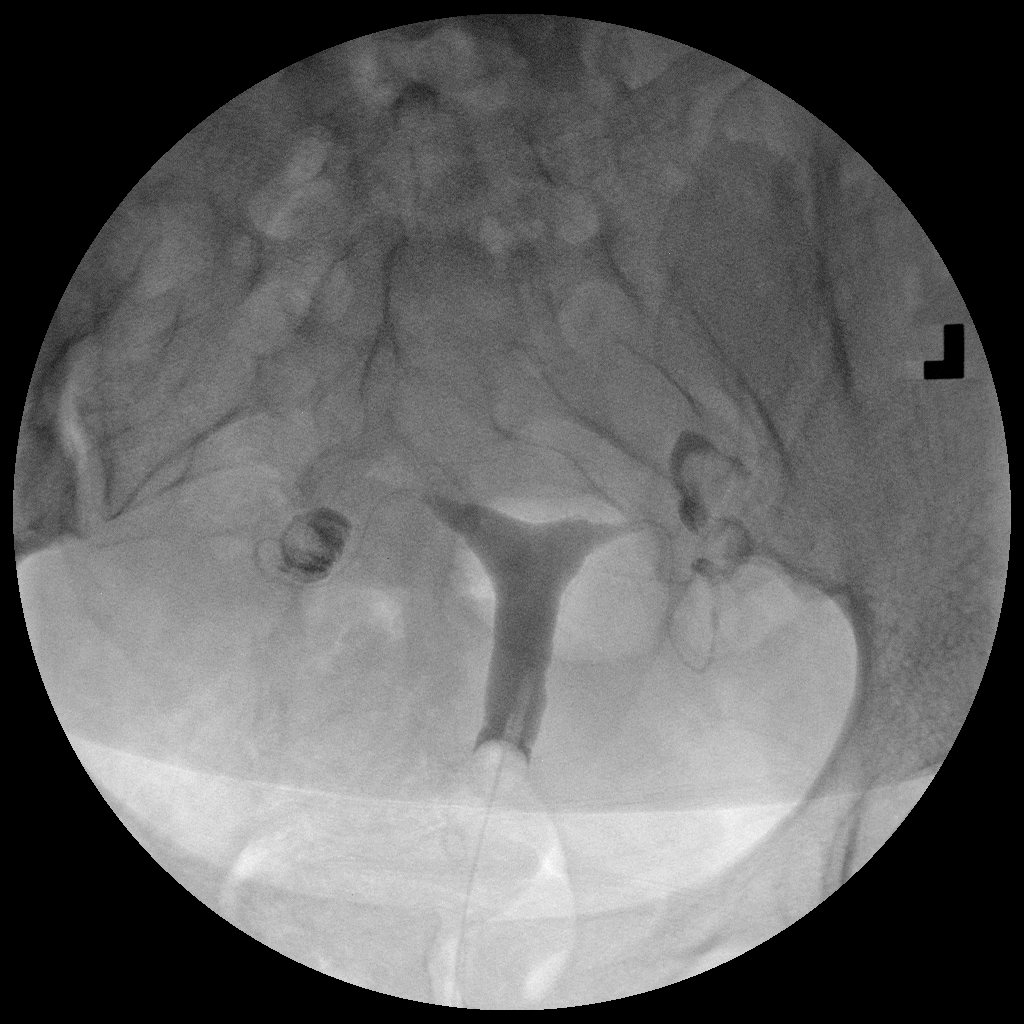

[Series 3: run · 1 of 1 slices shown (3 of 4)]
[im 1/1]
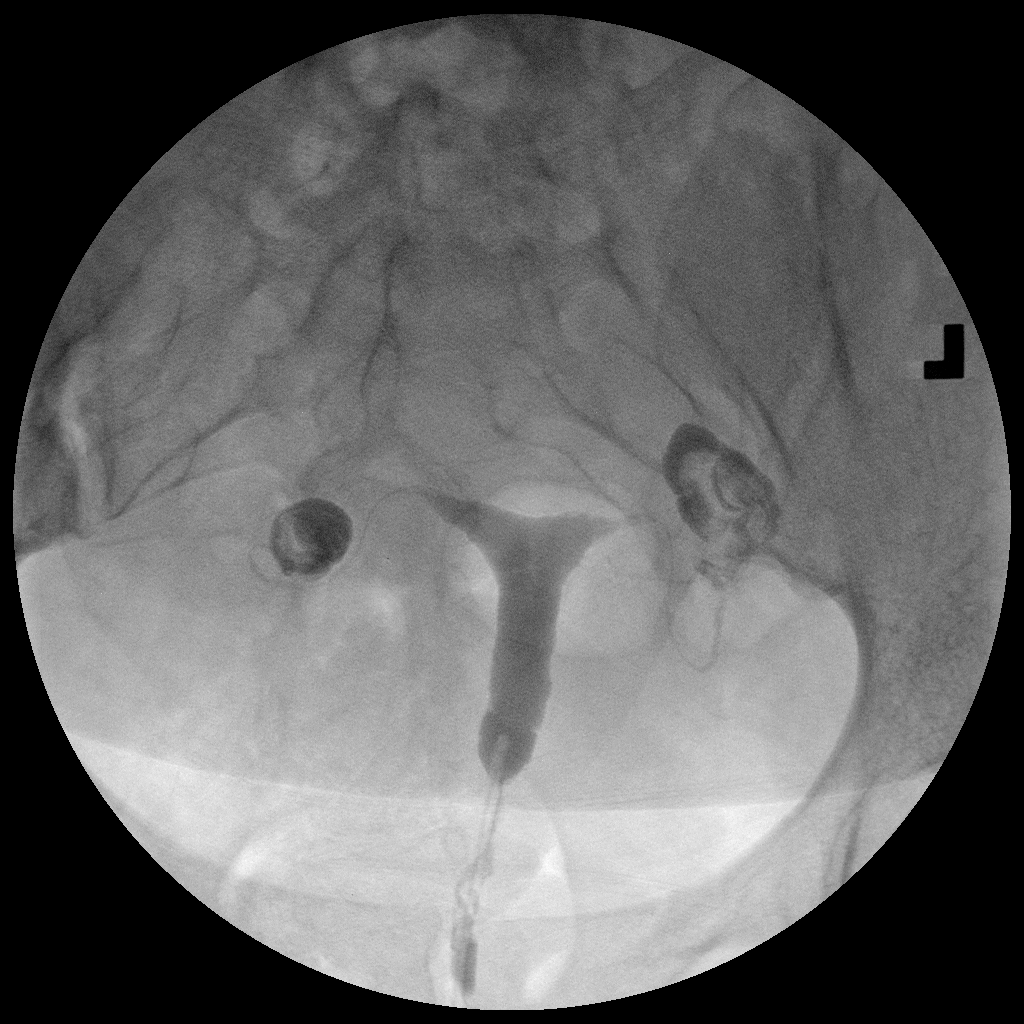

[Series 4: run · 1 of 1 slices shown (4 of 4)]
[im 1/1]
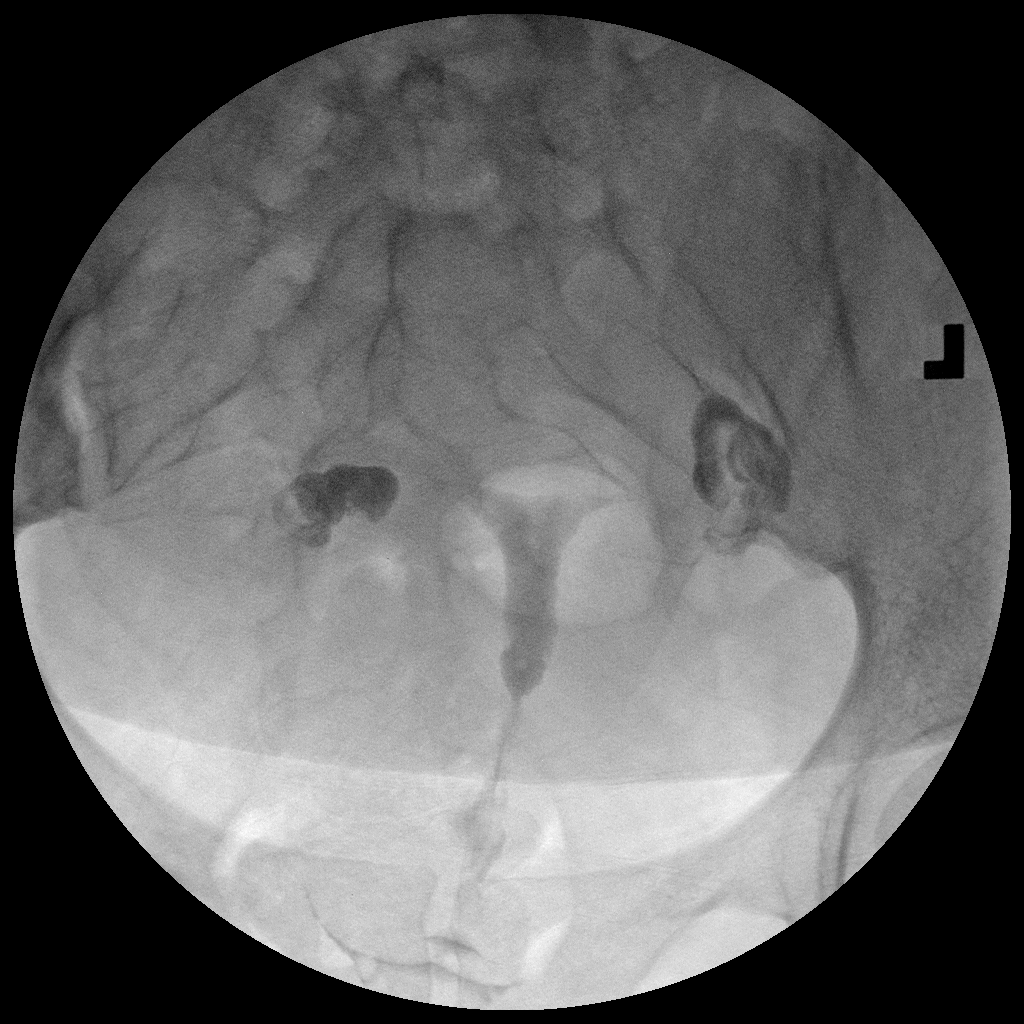

[4 of 4 positions shown; findings below may reference images not displayed]

FINDINGS: Normal appearance of the endometrial cavity. Filling of the
fallopian tubes bilaterally. No definite spillage seen from the
fallopian tubes.
IMPRESSION: No definite spillage noted from the fallopian tubes.

## 2019-02-02 MED FILL — hydrALAZINE HCL 10 MG TABS: 10 | 30 days supply | Qty: 60 | Fill #0

## 2019-03-07 DIAGNOSIS — Q250 Patent ductus arteriosus: Secondary | ICD-10-CM | POA: Insufficient documentation

## 2019-03-17 MED FILL — hydrALAZINE HCL 10 MG TABS: 10 | 30 days supply | Qty: 60 | Fill #0

## 2019-03-25 DIAGNOSIS — O99019 Anemia complicating pregnancy, unspecified trimester: Secondary | ICD-10-CM | POA: Insufficient documentation

## 2019-03-25 DIAGNOSIS — D573 Sickle-cell trait: Secondary | ICD-10-CM | POA: Insufficient documentation

## 2019-03-30 MED FILL — NIFEDIPINE ER OSMOTIC RELEA: 60 | 30 days supply | Qty: 30 | Fill #0

## 2019-04-20 MED FILL — NIFEDIPINE ER OSMOTIC RELEA: 60 | 30 days supply | Qty: 30 | Fill #0

## 2019-05-31 MED FILL — NIFEdipine ER OSMOTIC RELEA: 30 | 30 days supply | Qty: 30 | Fill #0

## 2019-08-15 MED FILL — NIFEDIPINE ER OSMOTIC RELEA: 60 | 30 days supply | Qty: 30 | Fill #1

## 2019-10-11 MED FILL — NIFEDIPINE ER OSMOTIC RELEA: 60 | 30 days supply | Qty: 30 | Fill #2

## 2019-10-15 MED FILL — metFORMIN HCL 500 MG TABS: 500 | 30 days supply | Qty: 120 | Fill #0

## 2020-01-02 MED FILL — LOSARTAN POTASSIUM 25 MG TA: 25 | 90 days supply | Qty: 90 | Fill #0

## 2020-01-02 MED FILL — VIT D2 1.25 MG (50,000 UNIT: 1.25 MG | 28 days supply | Qty: 4 | Fill #0

## 2020-01-02 MED FILL — METFORMIN HCL 500 MG TABS: 500 | 30 days supply | Qty: 120 | Fill #0

## 2020-01-02 MED FILL — PHENTERMINE 15 MG CAPSULE: 15 | 30 days supply | Qty: 30 | Fill #0

## 2020-01-30 MED FILL — LOSARTAN POTASSIUM 50 MG TA: 50 | 90 days supply | Qty: 90 | Fill #0

## 2020-01-30 MED FILL — PHENTERMINE 37.5 MG CAPSULE: 37.5 | 30 days supply | Qty: 30 | Fill #0

## 2020-03-02 ENCOUNTER — Other Ambulatory Visit (HOSPITAL_COMMUNITY): Payer: Self-pay | Admitting: Pain Medicine

## 2020-03-02 MED FILL — METFORMIN HCL 500 MG TABS: 500 | 30 days supply | Qty: 120 | Fill #0

## 2020-03-02 MED FILL — VIT D2 1.25 MG (50,000 UNIT: 1.25 MG | 84 days supply | Qty: 12 | Fill #0

## 2020-03-02 MED FILL — PHENTERMINE 37.5 MG CAPSULE: 37.5 | 30 days supply | Qty: 30 | Fill #0

## 2020-03-06 ENCOUNTER — Other Ambulatory Visit (HOSPITAL_COMMUNITY): Payer: Self-pay | Admitting: Pain Medicine

## 2020-03-06 MED FILL — TRULICITY 0.75 MG/0.5 ML PE: 0.75 | 56 days supply | Qty: 4 | Fill #0

## 2020-03-12 MED FILL — METFORMIN HCL 500 MG TABS: 500 | 30 days supply | Qty: 120 | Fill #0

## 2020-03-12 MED FILL — PHENTERMINE 37.5 MG CAPSULE: 37.5 | 30 days supply | Qty: 30 | Fill #0

## 2020-03-12 MED FILL — VIT D2 1.25 MG (50,000 UNIT: 1.25 MG | 84 days supply | Qty: 12 | Fill #0

## 2020-04-04 MED FILL — METFORMIN HCL 500 MG TABS: 500 | 30 days supply | Qty: 120 | Fill #1

## 2020-04-30 MED FILL — NITROFURANTOIN MONO-MCR 100: 100 | 5 days supply | Qty: 10 | Fill #0

## 2020-05-21 DIAGNOSIS — M546 Pain in thoracic spine: Secondary | ICD-10-CM | POA: Diagnosis not present

## 2020-05-21 DIAGNOSIS — M62838 Other muscle spasm: Secondary | ICD-10-CM | POA: Diagnosis not present

## 2020-05-21 DIAGNOSIS — M9902 Segmental and somatic dysfunction of thoracic region: Secondary | ICD-10-CM | POA: Diagnosis not present

## 2020-05-21 DIAGNOSIS — M62830 Muscle spasm of back: Secondary | ICD-10-CM | POA: Diagnosis not present

## 2020-05-21 DIAGNOSIS — M542 Cervicalgia: Secondary | ICD-10-CM | POA: Diagnosis not present

## 2020-05-21 DIAGNOSIS — M9901 Segmental and somatic dysfunction of cervical region: Secondary | ICD-10-CM | POA: Diagnosis not present

## 2020-05-21 DIAGNOSIS — M545 Low back pain, unspecified: Secondary | ICD-10-CM | POA: Diagnosis not present

## 2020-05-21 DIAGNOSIS — M9903 Segmental and somatic dysfunction of lumbar region: Secondary | ICD-10-CM | POA: Diagnosis not present

## 2020-05-28 ENCOUNTER — Other Ambulatory Visit (HOSPITAL_COMMUNITY): Payer: Self-pay | Admitting: Cardiology

## 2020-05-28 ENCOUNTER — Other Ambulatory Visit: Payer: Self-pay

## 2020-05-28 ENCOUNTER — Other Ambulatory Visit: Payer: Self-pay | Admitting: Cardiology

## 2020-05-28 ENCOUNTER — Encounter: Payer: Self-pay | Admitting: Cardiology

## 2020-05-28 ENCOUNTER — Ambulatory Visit: Payer: 59 | Admitting: Cardiology

## 2020-05-28 VITALS — BP 159/88 | HR 82 | Ht 61.0 in | Wt 206.0 lb

## 2020-05-28 DIAGNOSIS — M62838 Other muscle spasm: Secondary | ICD-10-CM | POA: Diagnosis not present

## 2020-05-28 DIAGNOSIS — M9901 Segmental and somatic dysfunction of cervical region: Secondary | ICD-10-CM | POA: Diagnosis not present

## 2020-05-28 DIAGNOSIS — I209 Angina pectoris, unspecified: Secondary | ICD-10-CM | POA: Diagnosis not present

## 2020-05-28 DIAGNOSIS — R002 Palpitations: Secondary | ICD-10-CM

## 2020-05-28 DIAGNOSIS — Z6838 Body mass index (BMI) 38.0-38.9, adult: Secondary | ICD-10-CM

## 2020-05-28 DIAGNOSIS — R0609 Other forms of dyspnea: Secondary | ICD-10-CM | POA: Diagnosis not present

## 2020-05-28 DIAGNOSIS — M545 Low back pain, unspecified: Secondary | ICD-10-CM | POA: Diagnosis not present

## 2020-05-28 DIAGNOSIS — M542 Cervicalgia: Secondary | ICD-10-CM | POA: Diagnosis not present

## 2020-05-28 DIAGNOSIS — I10 Essential (primary) hypertension: Secondary | ICD-10-CM

## 2020-05-28 DIAGNOSIS — M9903 Segmental and somatic dysfunction of lumbar region: Secondary | ICD-10-CM | POA: Diagnosis not present

## 2020-05-28 DIAGNOSIS — M62830 Muscle spasm of back: Secondary | ICD-10-CM | POA: Diagnosis not present

## 2020-05-28 DIAGNOSIS — Q250 Patent ductus arteriosus: Secondary | ICD-10-CM | POA: Diagnosis not present

## 2020-05-28 DIAGNOSIS — M546 Pain in thoracic spine: Secondary | ICD-10-CM | POA: Diagnosis not present

## 2020-05-28 DIAGNOSIS — M9902 Segmental and somatic dysfunction of thoracic region: Secondary | ICD-10-CM | POA: Diagnosis not present

## 2020-05-28 MED ORDER — HYDROCHLOROTHIAZIDE 25 MG PO TABS: 25.0000 mg | ORAL_TABLET | Freq: Every morning | ORAL | 0 refills | Status: DC

## 2020-05-28 MED FILL — TRULICITY 0.75 MG/0.5 ML PE: 0.75 | 28 days supply | Qty: 2 | Fill #0

## 2020-05-28 MED FILL — HYDROCHLOROTHIAZIDE 25 MG T: 25 | 30 days supply | Qty: 30 | Fill #0

## 2020-05-28 NOTE — Progress Notes (Signed)
Date:  05/28/2020   ID:  [name], DOB 07-20-1991, MRN [id number]  PCP:  Ailene Ards, MD  Cardiologist:  Tessa Lerner, DO, [facility] (established care 05/28/2020) Former Cardiology Providers: Dr. Jacinto Halim, Dr. Lyndel Safe.   REASON FOR CONSULT: Hx of suspected PDA diagnosis and HTN.   REQUESTING PHYSICIAN: Self-referral  Chief Complaint  Patient presents with  . Hypertension  . Heart Murmur  . Chest Pain  . Shortness of Breath  . patent ductus arteriosus    HPI  Nicole Stuart is a 29 y.o. 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   Patient self-referred herself for evaluation of chest pain and shortness of breath management.  Patient states that he recently started working for [employer] health and you have Dr. Jacinto Halim in the past and therefore returned to our practice for further evaluation and management.  Patient states that she was born as a premature baby and has done well overall besides knowing that she has a cardiac murmur.  During her recent pregnancy the murmur was more pronounced and underwent additional testing and evaluation and has also seen adult congenital cardiologist Dr. Lyndel Safe.  She gave birth to a baby boy back in December 2020 and since then has been having episodic events of palpitations, chest pain, dyspnea on exertion, and elevated blood pressures.  She decided to come seek medical attention given her chest discomfort.  Patient stated she is been having it for the last several months since the delivery of her son.  The discomfort is located substernally, worse with effort related activities such as walking up a hill or doing strenuous activity at work and the symptoms resolve with resting.  The discomfort is nonradiating, last for a few seconds,  intermittent, and has not taken any medications.  Patient states that she also has hypertension that was more pronounced during her pregnancy.  She states that she had gestational hypertension and prior to that no prior history of blood pressure.  Patient states in the past she is tolerated hydrochlorothiazide well with good results.  However, she is currently only on losartan.  Her home blood pressures systolic range between 160-188 mmHg and diastolic blood pressures range between 88-100 mmHg and pulse around 90 bpm.  Palpitations: Patient states that she has been having episodes of palpitations usually at the same time of her chest discomfort when she tries to exert herself.  The last for a few minutes, intermittent, improved with rest and worse with effort.  No consumption of illicit drugs, caffeinated beverages, dietary supplements, stimulant medications, herbal supplements, over-the-counter medications, and no known thyroid disease.  Given her underlying cardiac murmur she was evaluated by Dr. Lyndel Safe during her pregnancy and was noted to have a suspected PDA based on echocardiography.  I do not have the images to review at today's office visit.  Unable to obtain some information through care everywhere.  Patient states that she has been lost to follow-up and has not seen Dr. Lyndel Safe since delivery.  FUNCTIONAL STATUS: Two times a week takes her baby in stroller and walks less than a mile.    ALLERGIES: No Known Allergies  MEDICATION LIST PRIOR TO VISIT: Current Meds  Medication Sig  . Dulaglutide (TRULICITY) 0.75 MG/0.5ML SOPN Inject 0.75 mLs as directed once a week.  Marland Kitchen ibuprofen (ADVIL,MOTRIN) 600 MG  tablet Take 600 mg by mouth every 6 (six) hours as needed. For pain  . losartan (COZAAR) 50 MG tablet Take 50 mg by mouth daily.  . metFORMIN (GLUCOPHAGE) 500 MG tablet Take 1,000 mg by mouth 2 (two) times daily.  Marland Kitchen PRENAT-B2-B6-B12-D3-FA PO Take 1 capsule by mouth daily.  . Vitamin D,  Ergocalciferol, (DRISDOL) 50000 UNITS CAPS capsule Take 50,000 Units by mouth every 7 (seven) days.     PAST MEDICAL HISTORY: Past Medical History:  Diagnosis Date  . Asthma   . Dysrhythmia    first degree heart block.   Marland Kitchen Heart murmur   . Hypertension   . Sleep apnea    study done two weeks ago [place]     PAST SURGICAL HISTORY: Past Surgical History:  Procedure Laterality Date  . CESAREAN SECTION    . NASAL SEPTOPLASTY W/ TURBINOPLASTY  09/22/2011   Procedure: NASAL SEPTOPLASTY WITH TURBINATE REDUCTION;  Surgeon: Drema Halon, MD;  Location: [facility] OR;  Service: ENT;  Laterality: Bilateral;  . TONSILLECTOMY  09/22/2011   Procedure: TONSILLECTOMY;  Surgeon: Drema Halon, MD;  Location: [facility] OR;  Service: ENT;  Laterality: Bilateral;    FAMILY HISTORY: The patient family history includes Diabetes in her mother; Hypertension in her brother and sister; Obesity in her mother. She was adopted.  SOCIAL HISTORY:  The patient  reports that she has never smoked. She has never used smokeless tobacco. She reports that she does not drink alcohol and does not use drugs.  REVIEW OF SYSTEMS: Review of Systems  Constitutional: Negative for chills and fever.  HENT: Negative for hoarse voice and nosebleeds.   Eyes: Negative for discharge, double vision and pain.  Cardiovascular: Positive for chest pain, dyspnea on exertion and palpitations. Negative for claudication, leg swelling, near-syncope, orthopnea, paroxysmal nocturnal dyspnea and syncope.  Respiratory: Positive for shortness of breath. Negative for hemoptysis.   Musculoskeletal: Negative for muscle cramps and myalgias.  Gastrointestinal: Negative for abdominal pain, constipation, diarrhea, hematemesis, hematochezia, melena, nausea and vomiting.  Neurological: Negative for dizziness and light-headedness.   PHYSICAL EXAM: Vitals with BMI 05/28/2020 05/28/2020 09/23/2011  Height - 5\' 1"  -  Weight - 206 lbs -  BMI - 38.94 -   Systolic 159 171  Diastolic 88 91 66  Pulse 82 89 -  Some encounter information is confidential and restricted. Go to Review Flowsheets activity to see all data.   CONSTITUTIONAL: Well-developed and well-nourished. No acute distress.  SKIN: Skin is warm and dry. No rash noted. No cyanosis. No pallor. No jaundice HEAD: Normocephalic and atraumatic.  EYES: No scleral icterus MOUTH/THROAT: Moist oral membranes.  NECK: No JVD present. No thyromegaly noted.  Bilateral carotid bruits  LYMPHATIC: No visible cervical adenopathy.  CHEST Normal respiratory effort. No intercostal retractions  LUNGS: Clear to auscultation bilaterally.  No stridor. No wheezes. No rales.  CARDIOVASCULAR: Regular, positive S1-S2, continuous murmur heard over the little left upper sternal border, no gallops or rubs or heaves. ABDOMINAL: Obese, soft, nontender, distended, positive bowel sounds all 4 quadrants, no apparent ascites.  EXTREMITIES: No peripheral edema.  2+ bilateral dorsalis pedis and posterior tibial pulses HEMATOLOGIC: No significant bruising NEUROLOGIC: Oriented to person, place, and time. Nonfocal. Normal muscle tone.  PSYCHIATRIC: Normal mood and affect. Normal behavior. Cooperative  CARDIAC DATABASE: EKG: 05/28/2020: Normal sinus rhythm, 84 bpm, normal axis, poor R wave progression, without underlying ischemia or injury pattern.  Echocardiogram: No results found for this or any previous visit from the  past 1095 days.   Stress Testing: No results found for this or any previous visit from the past 1095 days.  Heart Catheterization: None  LABORATORY DATA: CBC Latest Ref Rng & Units 09/15/2011  WBC 4.0 - 10.5 K/uL 12.3(H)  Hemoglobin 12.0 - 15.0 g/dL 23.5  Hematocrit 36 - 46 % 35.1(L)  Platelets 150 - 400 K/uL 380    No flowsheet data found.  Lipid Panel  No results found for: CHOL, TRIG, HDL, CHOLHDL, VLDL, LDLCALC, LDLDIRECT, LABVLDL  No components found for: NTPROBNP No results  for input(s): PROBNP in the last 8760 hours. No results for input(s): TSH in the last 8760 hours.  BMP No results for input(s): NA, K, CL, CO2, GLUCOSE, BUN, CREATININE, CALCIUM, GFRNONAA, GFRAA in the last 8760 hours.  HEMOGLOBIN A1C No results found for: HGBA1C, MPG  IMPRESSION:    ICD-10-CM   1. Benign hypertension  I10 EKG 12-Lead    Basic metabolic panel    Magnesium    hydrochlorothiazide (HYDRODIURIL) 25 MG tablet    Magnesium    Basic metabolic panel  2. Angina pectoris (HCC)  I20.9 PCV MYOCARDIAL PERFUSION WO LEXISCAN    Pregnancy, urine  3. Palpitations  R00.2   4. Class 2 severe obesity due to excess calories with serious comorbidity and body mass index (BMI) of 38.0 to 38.9 in adult (HCC)  E66.01    Z68.38   5. 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 DOE (dyspnea on exertion)  R06.00 PCV MYOCARDIAL PERFUSION WO LEXISCAN     RECOMMENDATIONS: Nicole Stuart is a 29 y.o. 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   Angina pectoris:  Patient symptoms of chest pain are suggestive of angina pectoris.  EKG shows normal sinus rhythm without underlying injury pattern.  Plan echocardiogram to evaluate for LVEF and structural heart disease.  Plan exercise nuclear stress test to evaluate for underlying functional capacity and reversible ischemia.  Patient is asked to seek medical attention by going to the closest ER via EMS if she has worsening symptoms of typical chest pain or her symptoms increase in intensity, frequency, and/or duration.  She verbalizes understanding and provides verbal feedback.  Dyspnea on exertion: May be multifactorial due to underlying uncontrolled hypertension, possible underlying CAD, deconditioning, obesity.  Recommend echocardiogram to evaluate for right-sided hemodynamics  given her history of suspected PDA.  History of suspected PDA:  Patient is asked to follow-up with Dr.Upadhya to reestablish care and to see if any additional testing is clinically warranted at this time.  Patient states that she will follow-up prior to our next visit.  Benign essential hypertension:  Currently on losartan.  We will restart hydrochlorothiazide at 25 mg p.o. every morning.  We will obtain baseline BMP and magnesium level.  Repeat blood work in 1 week to reevaluate kidney function electrolytes.  Patient is asked to keep a log of her blood pressures and to call the office if her home blood pressures consistently greater than 160 mmHg despite the initiation of HCTZ.   FINAL MEDICATION LIST END OF ENCOUNTER: Meds ordered this encounter  Medications  . hydrochlorothiazide (HYDRODIURIL) 25 MG tablet    Sig: Take 1 tablet (25 mg total) by mouth in the morning.    Dispense:  30 tablet    Refill:  0    There are no discontinued medications.   Current  Outpatient Medications:  .  Dulaglutide (TRULICITY) 0.75 MG/0.5ML SOPN, Inject 0.75 mLs as directed once a week., Disp: , Rfl:  .  ibuprofen (ADVIL,MOTRIN) 600 MG tablet, Take 600 mg by mouth every 6 (six) hours as needed. For pain, Disp: , Rfl:  .  losartan (COZAAR) 50 MG tablet, Take 50 mg by mouth daily., Disp: , Rfl:  .  metFORMIN (GLUCOPHAGE) 500 MG tablet, Take 1,000 mg by mouth 2 (two) times daily., Disp: , Rfl:  .  PRENAT-B2-B6-B12-D3-FA PO, Take 1 capsule by mouth daily., Disp: , Rfl:  .  Vitamin D, Ergocalciferol, (DRISDOL) 50000 UNITS CAPS capsule, Take 50,000 Units by mouth every 7 (seven) days., Disp: , Rfl:  .  hydrochlorothiazide (HYDRODIURIL) 25 MG tablet, Take 1 tablet (25 mg total) by mouth in the morning., Disp: 30 tablet, Rfl: 0  Orders Placed This Encounter  Procedures  . Basic metabolic panel  . Magnesium  . Pregnancy, urine  . PCV MYOCARDIAL PERFUSION WO LEXISCAN  . EKG 12-Lead  . ECHOCARDIOGRAM  COMPLETE    There are no Patient Instructions on file for this visit.   --Continue cardiac medications as reconciled in final medication list. --Return in about 4 weeks (around 06/25/2020) for Follow up CP, Dyspnea, Review test results. Or sooner if needed. --Continue follow-up with your primary care physician regarding the management of your other chronic comorbid conditions.  Patient's questions and concerns were addressed to her satisfaction. She voices understanding of the instructions provided during this encounter.   This note was created using a voice recognition software as a result there may be grammatical errors inadvertently enclosed that do not reflect the nature of this encounter. Every attempt is made to correct such errors.  Tessa LernerSunit Mabrey Howland, [place]DO, Madison Regional Health SystemFACC  Pager: [phone] Office: [phone]

## 2020-05-29 ENCOUNTER — Other Ambulatory Visit: Payer: Self-pay

## 2020-05-29 DIAGNOSIS — I209 Angina pectoris, unspecified: Secondary | ICD-10-CM

## 2020-05-29 DIAGNOSIS — Q250 Patent ductus arteriosus: Secondary | ICD-10-CM

## 2020-05-29 DIAGNOSIS — R0609 Other forms of dyspnea: Secondary | ICD-10-CM

## 2020-05-29 LAB — BASIC METABOLIC PANEL
BUN/Creatinine Ratio: 13 (ref 9–23)
BUN: 7 mg/dL (ref 6–20)
CO2: 23 mmol/L (ref 20–29)
Calcium: 9.3 mg/dL (ref 8.7–10.2)
Chloride: 100 mmol/L (ref 96–106)
Creatinine, Ser: 0.55 mg/dL — ABNORMAL LOW (ref 0.57–1.00)
GFR calc Af Amer: 145 mL/min/{1.73_m2} (ref >59)
GFR calc non Af Amer: 125 mL/min/{1.73_m2} (ref >59)
Glucose: 89 mg/dL (ref 65–99)
Potassium: 4.2 mmol/L (ref 3.5–5.2)
Sodium: 138 mmol/L (ref 134–144)

## 2020-05-29 LAB — PREGNANCY, URINE: Preg Test, Ur: NEGATIVE

## 2020-05-29 LAB — MAGNESIUM: Magnesium: 1.7 mg/dL (ref 1.6–2.3)

## 2020-05-30 DIAGNOSIS — M5386 Other specified dorsopathies, lumbar region: Secondary | ICD-10-CM | POA: Diagnosis not present

## 2020-05-30 DIAGNOSIS — M9901 Segmental and somatic dysfunction of cervical region: Secondary | ICD-10-CM | POA: Diagnosis not present

## 2020-05-30 DIAGNOSIS — M9904 Segmental and somatic dysfunction of sacral region: Secondary | ICD-10-CM | POA: Diagnosis not present

## 2020-05-30 DIAGNOSIS — M9902 Segmental and somatic dysfunction of thoracic region: Secondary | ICD-10-CM | POA: Diagnosis not present

## 2020-05-30 DIAGNOSIS — M791 Myalgia, unspecified site: Secondary | ICD-10-CM | POA: Diagnosis not present

## 2020-05-30 DIAGNOSIS — M9903 Segmental and somatic dysfunction of lumbar region: Secondary | ICD-10-CM | POA: Diagnosis not present

## 2020-05-30 NOTE — Telephone Encounter (Signed)
Nicole Stuart, please update her losartan dose. She will need to get a CD from [facility] when she gets her echo done so that she can share it with Dr. Lyndel Safe.

## 2020-06-01 DIAGNOSIS — M9902 Segmental and somatic dysfunction of thoracic region: Secondary | ICD-10-CM | POA: Diagnosis not present

## 2020-06-01 DIAGNOSIS — M9904 Segmental and somatic dysfunction of sacral region: Secondary | ICD-10-CM | POA: Diagnosis not present

## 2020-06-01 DIAGNOSIS — M9903 Segmental and somatic dysfunction of lumbar region: Secondary | ICD-10-CM | POA: Diagnosis not present

## 2020-06-01 DIAGNOSIS — M791 Myalgia, unspecified site: Secondary | ICD-10-CM | POA: Diagnosis not present

## 2020-06-01 DIAGNOSIS — M9901 Segmental and somatic dysfunction of cervical region: Secondary | ICD-10-CM | POA: Diagnosis not present

## 2020-06-01 DIAGNOSIS — M5386 Other specified dorsopathies, lumbar region: Secondary | ICD-10-CM | POA: Diagnosis not present

## 2020-06-04 ENCOUNTER — Ambulatory Visit: Payer: 59

## 2020-06-04 ENCOUNTER — Other Ambulatory Visit: Payer: Self-pay

## 2020-06-04 DIAGNOSIS — R0609 Other forms of dyspnea: Secondary | ICD-10-CM | POA: Diagnosis not present

## 2020-06-04 DIAGNOSIS — I209 Angina pectoris, unspecified: Secondary | ICD-10-CM | POA: Diagnosis not present

## 2020-06-08 DIAGNOSIS — M9901 Segmental and somatic dysfunction of cervical region: Secondary | ICD-10-CM | POA: Diagnosis not present

## 2020-06-08 DIAGNOSIS — M9904 Segmental and somatic dysfunction of sacral region: Secondary | ICD-10-CM | POA: Diagnosis not present

## 2020-06-08 DIAGNOSIS — M9902 Segmental and somatic dysfunction of thoracic region: Secondary | ICD-10-CM | POA: Diagnosis not present

## 2020-06-08 DIAGNOSIS — M5386 Other specified dorsopathies, lumbar region: Secondary | ICD-10-CM | POA: Diagnosis not present

## 2020-06-08 DIAGNOSIS — M791 Myalgia, unspecified site: Secondary | ICD-10-CM | POA: Diagnosis not present

## 2020-06-08 DIAGNOSIS — M9903 Segmental and somatic dysfunction of lumbar region: Secondary | ICD-10-CM | POA: Diagnosis not present

## 2020-06-15 ENCOUNTER — Ambulatory Visit (HOSPITAL_COMMUNITY): Admission: RE | Admit: 2020-06-15 | Payer: 59 | Source: Ambulatory Visit

## 2020-06-21 ENCOUNTER — Other Ambulatory Visit (HOSPITAL_COMMUNITY): Payer: Self-pay | Admitting: Internal Medicine

## 2020-06-21 DIAGNOSIS — Q250 Patent ductus arteriosus: Secondary | ICD-10-CM | POA: Diagnosis not present

## 2020-06-21 MED FILL — LOSARTAN POTASSIUM 100 MG T: 100 | 90 days supply | Qty: 90 | Fill #0

## 2020-06-21 MED FILL — TRULICITY 0.75 MG/0.5 ML PE: 0.75 | 28 days supply | Qty: 2 | Fill #0

## 2020-06-21 MED FILL — ATORVASTATIN CALCIUM 20 MG: 20 | 90 days supply | Qty: 90 | Fill #0

## 2020-06-22 DIAGNOSIS — I10 Essential (primary) hypertension: Secondary | ICD-10-CM | POA: Diagnosis not present

## 2020-06-23 LAB — BASIC METABOLIC PANEL
BUN/Creatinine Ratio: 13 (ref 9–23)
BUN: 7 mg/dL (ref 6–20)
CO2: 26 mmol/L (ref 20–29)
Calcium: 9.5 mg/dL (ref 8.7–10.2)
Chloride: 98 mmol/L (ref 96–106)
Creatinine, Ser: 0.56 mg/dL — ABNORMAL LOW (ref 0.57–1.00)
GFR calc Af Amer: 144 mL/min/{1.73_m2} (ref >59)
GFR calc non Af Amer: 125 mL/min/{1.73_m2} (ref >59)
Glucose: 91 mg/dL (ref 65–99)
Potassium: 3.9 mmol/L (ref 3.5–5.2)
Sodium: 137 mmol/L (ref 134–144)

## 2020-06-23 LAB — MAGNESIUM: Magnesium: 1.8 mg/dL (ref 1.6–2.3)

## 2020-06-25 ENCOUNTER — Encounter: Payer: Self-pay | Admitting: Cardiology

## 2020-06-25 ENCOUNTER — Ambulatory Visit: Payer: 59 | Admitting: Cardiology

## 2020-06-25 ENCOUNTER — Other Ambulatory Visit: Payer: Self-pay

## 2020-06-25 VITALS — BP 142/82 | HR 97 | Ht 61.0 in | Wt 202.0 lb

## 2020-06-25 DIAGNOSIS — I10 Essential (primary) hypertension: Secondary | ICD-10-CM | POA: Diagnosis not present

## 2020-06-25 DIAGNOSIS — Z712 Person consulting for explanation of examination or test findings: Secondary | ICD-10-CM | POA: Diagnosis not present

## 2020-06-25 DIAGNOSIS — E119 Type 2 diabetes mellitus without complications: Secondary | ICD-10-CM

## 2020-06-25 DIAGNOSIS — Z6838 Body mass index (BMI) 38.0-38.9, adult: Secondary | ICD-10-CM | POA: Diagnosis not present

## 2020-06-25 DIAGNOSIS — E66812 Obesity, class 2: Secondary | ICD-10-CM

## 2020-06-25 DIAGNOSIS — Q250 Patent ductus arteriosus: Secondary | ICD-10-CM | POA: Diagnosis not present

## 2020-06-25 NOTE — Progress Notes (Signed)
Date:  06/25/2020   ID:  Nicole Stuart, DOB 08-Apr-1991, MRN [id number]  PCP:  Ailene ArdsMims, Jacqueline, MD  Cardiologist:  Tessa LernerSunit Ena Demary, DO, [facility] (established care 05/28/2020) Former Cardiology Providers: Dr. Jacinto HalimGanji, Dr. Lyndel SafeUpadhya.   REASON FOR CONSULT: Hx of suspected PDA diagnosis and HTN.   Date: 06/25/20 Last Office Visit: 05/28/2020  Chief Complaint  Patient presents with  . Hypertension    test results  . Follow-up    HPI  Nicole Stuart is a 29 y.o. female who presents to the office with a chief complaint of "reevaluation of chest pain or shortness of breath and management of high blood pressure." Patient's past medical history and cardiovascular risk factors include: Hypertension, NIDDM,  suspected PDA by echocardiogram, sickle cell trait, polycystic ovarian syndrome, obesity.   She is a self-referral to the practice who was originally seen back in October 2021 for evaluation of chest pain, shortness of breath.  At last office visit patient symptoms of chest pain appear to be atypical in nature but she has multiple cardiovascular risk factors including non-insulin-dependent diabetes mellitus type 2 and therefore an ischemic evaluation was warranted.  Since last visit she did undergo exercise nuclear stress test results noted that she achieved 7 METS, 85% of maximum predicted heart rate, hypertensive response to exercise, and myocardial perfusion study noted normal perfusion overall low risk study.  Since last office visit she states that the chest pain, shortness of breath, and palpitations have completely resolved.  She was also started on antihypertensive medications at the last office visit and they were also uptitrated by her congenital cardiologist Dr. Lyndel SafeUpadhya.  As of June 22, 2020 losartan was increased to 100 mg p.o. daily.  Prior to establishing care patient systolic blood pressures range between 160-188 mmHg and diastolic blood pressures range between 88-100 mmHg.  Since her last  visit her blood pressures have improved and now the systolic blood pressures range between 134-140 mmHg and diastolic blood pressures usually range around 80 mmHg.  She also [facility] and was reassured that nothing needs to be done for her underlying PDA.  She is scheduled for an echocardiogram with her next year.   Since last visit she is also working on improving her modifiable cardiovascular risk factors.  She has been started on Metformin and Trulicity given her non-insulin-dependent diabetes mellitus type 2.  She is also been started on statin therapy.  She [facility] [employer] [facility] times per week and also has a Systems analystpersonal trainer.  Patient appears to be very motivated to lose weight and improving her modifiable cardiovascular risk factors.  FUNCTIONAL STATUS: [facility] [employer] [facility] and works out with a Systems analystpersonal trainer.  ALLERGIES: Allergies  Allergen Reactions  . Pineapple Rash    MEDICATION LIST PRIOR TO VISIT: Current Meds  Medication Sig  . aspirin EC 81 MG tablet Take 81 mg by mouth daily. Swallow whole.  Marland Kitchen. atorvastatin (LIPITOR) 20 MG tablet Take 20 mg by mouth daily.  . Dulaglutide (TRULICITY) 0.75 MG/0.5ML SOPN Inject 0.75 mLs as directed once a week.  . hydrochlorothiazide (HYDRODIURIL) 25 MG tablet Take 1 tablet (25 mg total) by mouth in the morning.  Marland Kitchen. losartan (COZAAR) 100 MG tablet Take 100 mg by mouth daily.  . metFORMIN (GLUCOPHAGE) 500 MG tablet Take 1,000 mg by mouth 2 (two) times daily.  . Multiple Vitamins-Minerals (MULTIVITAMIN WITH MINERALS) tablet Take 1 tablet  by mouth daily.  . Vitamin D, Ergocalciferol, (DRISDOL) 50000 UNITS CAPS capsule Take 50,000 Units by mouth every 7 (seven) days.  . [DISCONTINUED] PRENAT-B2-B6-B12-D3-FA PO Take 1 capsule by mouth daily.     PAST MEDICAL HISTORY: Past Medical History:  Diagnosis Date  . Asthma   . Dysrhythmia    first degree heart block.   Marland Kitchen  Heart murmur   . Hypertension   . Sleep apnea    study done two weeks ago [place]     PAST SURGICAL HISTORY: Past Surgical History:  Procedure Laterality Date  . CESAREAN SECTION    . NASAL SEPTOPLASTY W/ TURBINOPLASTY  09/22/2011   Procedure: NASAL SEPTOPLASTY WITH TURBINATE REDUCTION;  Surgeon: Drema Halon, MD;  Location: [facility] OR;  Service: ENT;  Laterality: Bilateral;  . TONSILLECTOMY  09/22/2011   Procedure: TONSILLECTOMY;  Surgeon: Drema Halon, MD;  [facility];  Service: ENT;  Laterality: Bilateral;    FAMILY HISTORY: The patient family history includes Diabetes in her mother; Hypertension in her brother and sister; Obesity in her mother. She was adopted.  SOCIAL HISTORY:  The patient  reports that she has never smoked. She has never used smokeless tobacco. She reports that she does not drink alcohol and does not use drugs.  REVIEW OF SYSTEMS: Review of Systems  Constitutional: Negative for chills and fever.  HENT: Negative for hoarse voice and nosebleeds.   Eyes: Negative for discharge, double vision and pain.  Cardiovascular: Negative for chest pain, claudication, dyspnea on exertion, leg swelling, near-syncope, orthopnea, palpitations, paroxysmal nocturnal dyspnea and syncope.  Respiratory: Negative for hemoptysis and shortness of breath.   Musculoskeletal: Negative for muscle cramps and myalgias.  Gastrointestinal: Negative for abdominal pain, constipation, diarrhea, hematemesis, hematochezia, melena, nausea and vomiting.  Neurological: Negative for dizziness and light-headedness.   PHYSICAL EXAM: Vitals with BMI 06/25/2020 05/28/2020 05/28/2020  Height 5\' 1"  - 5\' 1"   Weight 202 lbs - 206 lbs  BMI 38.19 - 38.94  Systolic 142 159  Diastolic 82 88 91  Pulse 97 82 89  Some encounter information is confidential and restricted. Go to Review Flowsheets activity to see all data.   CONSTITUTIONAL: Well-developed and well-nourished. No acute distress.   SKIN: Skin is warm and dry. No rash noted. No cyanosis. No pallor. No jaundice HEAD: Normocephalic and atraumatic.  EYES: No scleral icterus MOUTH/THROAT: Moist oral membranes.  NECK: No JVD present. No thyromegaly noted.  Bilateral carotid bruits  LYMPHATIC: No visible cervical adenopathy.  CHEST Normal respiratory effort. No intercostal retractions  LUNGS: Clear to auscultation bilaterally.  No stridor. No wheezes. No rales.  CARDIOVASCULAR: Regular, positive S1-S2, continuous murmur heard over the little left upper sternal border, no gallops or rubs or heaves. ABDOMINAL: Obese, soft, nontender, distended, positive bowel sounds all 4 quadrants, no apparent ascites.  EXTREMITIES: No peripheral edema.  2+ bilateral dorsalis pedis and posterior tibial pulses HEMATOLOGIC: No significant bruising NEUROLOGIC: Oriented to person, place, and time. Nonfocal. Normal muscle tone.  PSYCHIATRIC: Normal mood and affect. Normal behavior. Cooperative  CARDIAC DATABASE: EKG: 05/28/2020: Normal sinus rhythm, 84 bpm, normal axis, poor R wave progression, without underlying ischemia or injury pattern.  Echocardiogram: No results found for this or any previous visit from the past 1095 days.   Stress Testing: Exercise Sestamibi stress test 06/04/2020: Exercise nuclear stress test was performed using Bruce protocol. Patient reached 7 METS, and 85% of age predicted maximum heart rate. Exercise capacity was low Chest pain not reported.  Normal heart rate response, hypertensive blood pressure response with peak BP 204/84 mmHg. Stress EKG revealed no ischemic changes. Normal myocardial perfusion. Stress LVEF 58%. Low risk study.   Heart Catheterization: None  LABORATORY DATA: CBC Latest Ref Rng & Units 09/15/2011  WBC 4.0 - 10.5 K/uL 12.3(H)  Hemoglobin 12.0 - 15.0 g/dL 78.2  Hematocrit 36 - 46 % 35.1(L)  Platelets 150 - 400 K/uL 380    CMP Latest Ref Rng & Units 06/22/2020 05/28/2020  Glucose 65 - 99  mg/dL 91 89  BUN 6 - 20 mg/dL 7 7  Creatinine 9.56 - 1.00 mg/dL 2.13(Y) 8.65(H)  Sodium 134 - 144 mmol/L 137 138  Potassium 3.5 - 5.2 mmol/L 3.9 4.2  Chloride 96 - 106 mmol/L 98 100  CO2 20 - 29 mmol/L 26 23  Calcium 8.7 - 10.2 mg/dL 9.5 9.3    Lipid Panel  No results found for: CHOL, TRIG, HDL, CHOLHDL, VLDL, LDLCALC, LDLDIRECT, LABVLDL  No components found for: NTPROBNP No results for input(s): PROBNP in the last 8760 hours. No results for input(s): TSH in the last 8760 hours.  BMP Recent Labs    05/28/20 1614 06/22/20 1145  NA 138 137  K 4.2 3.9  CL 100 98  CO2 23 26  GLUCOSE 89 91  BUN 7 7  CREATININE 0.55* 0.56*  CALCIUM 9.3 9.5  GFRNONAA 125 125  GFRAA 145 144    HEMOGLOBIN A1C No results found for: HGBA1C, MPG  IMPRESSION:    ICD-10-CM   1. Benign hypertension  I10 Basic metabolic panel    Magnesium    Magnesium    Basic metabolic panel  2. PDA (patent ductus arteriosus)  Q25.0   3. Non-insulin dependent type 2 diabetes mellitus (HCC)  E11.9   4. Class 2 severe obesity due to excess calories with serious comorbidity and body mass index (BMI) of 38.0 to 38.9 in adult (HCC)  E66.01    Z68.38   5. Encounter to discuss test results  Z71.2      RECOMMENDATIONS: Keerthi Hazell Rady is a 29 y.o. female whose past medical history and cardiac risk factors include: Hypertension, NIDDM, suspected PDA by echocardiogram, sickle cell trait, polycystic ovarian syndrome, obesity.   Benign essential hypertension:  Patient is office blood pressure is improving and home blood pressures are also better since initiation of pharmacological therapy.  Given her underlying non-insulin-dependent diabetes mellitus type 2 would recommend a systolic blood pressure equal to or less than 130 mmHg.  Her losartan was just increased to 100 mg p.o. daily on 06/22/2020.  We will repeat blood work in 1 week to evaluate kidney function and electrolytes.  For now I have asked her to keep a log  of her blood pressures and to call the office sooner if her systolic blood pressures are not between 120-130 mmHg.  Low-salt diet recommended.  Recommend increasing physical activity to moderate intensity exercise 30 minutes a day 5 days a week at least.  Patient is very motivated on losing weight given her other chronic comorbid conditions such as diabetes, hypertension, PCOS, and obesity  History of PDA:  Followed up with Dr. Lyndel Safe since the last visit. No additional recommendations needed at this time and 1 year follow up recommended per patient.   Benign essential hypertension:  Currently on losartan.  Continue hydrochlorothiazide at 25 mg p.o. every morning.  We will obtain baseline BMP and magnesium level.  Repeat blood work in 1 week to reevaluate kidney function electrolytes.  Patient is asked to keep a log of her blood pressures and to call the office if her home blood pressures consistently greater than 130 mmHg.  Non-insulin-dependent diabetes mellitus type 2: Currently managed by primary care team.  Educated on importance of glycemic control.  Currently on Metformin and Trulicity.  Obesity, due to excess calories: Body mass index is 38.17 kg/m. . I reviewed with the patient the importance of diet, regular physical activity/exercise, weight loss.   . Patient is educated on increasing physical activity gradually as tolerated.  With the goal of moderate intensity exercise for 30 minutes a day 5 days a week.  Independently reviewed the most recent exercise nuclear stress test results with the patient at today's office visit.  FINAL MEDICATION LIST END OF ENCOUNTER: No orders of the defined types were placed in this encounter.   Medications Discontinued During This Encounter  Medication Reason  . ibuprofen (ADVIL,MOTRIN) 600 MG tablet Completed Course  . losartan (COZAAR) 50 MG tablet Dose change  . PRENAT-B2-B6-B12-D3-FA PO Completed Course     Current Outpatient  Medications:  .  aspirin EC 81 MG tablet, Take 81 mg by mouth daily. Swallow whole., Disp: , Rfl:  .  atorvastatin (LIPITOR) 20 MG tablet, Take 20 mg by mouth daily., Disp: , Rfl:  .  Dulaglutide (TRULICITY) 0.75 MG/0.5ML SOPN, Inject 0.75 mLs as directed once a week., Disp: , Rfl:  .  hydrochlorothiazide (HYDRODIURIL) 25 MG tablet, Take 1 tablet (25 mg total) by mouth in the morning., Disp: 30 tablet, Rfl: 0 .  losartan (COZAAR) 100 MG tablet, Take 100 mg by mouth daily., Disp: , Rfl:  .  metFORMIN (GLUCOPHAGE) 500 MG tablet, Take 1,000 mg by mouth 2 (two) times daily., Disp: , Rfl:  .  Multiple Vitamins-Minerals (MULTIVITAMIN WITH MINERALS) tablet, Take 1 tablet by mouth daily., Disp: , Rfl:  .  Vitamin D, Ergocalciferol, (DRISDOL) 50000 UNITS CAPS capsule, Take 50,000 Units by mouth every 7 (seven) days., Disp: , Rfl:   Orders Placed This Encounter  Procedures  . Basic metabolic panel  . Magnesium    There are no Patient Instructions on file for this visit.   --Continue cardiac medications as reconciled in final medication list. --Return in about 3 months (around 09/25/2020) for Follow up, BP. Or sooner if needed. --Continue follow-up with your primary care physician regarding the management of your other chronic comorbid conditions.  Patient's questions and concerns were addressed to her satisfaction. She voices understanding of the instructions provided during this encounter.   This note was created using a voice recognition software as a result there may be grammatical errors inadvertently enclosed that do not reflect the nature of this encounter. Every attempt is made to correct such errors.  Tessa Lerner, [place], [facility]  Pager: [phone] Office: [phone]

## 2020-07-05 ENCOUNTER — Other Ambulatory Visit (HOSPITAL_COMMUNITY): Payer: Self-pay | Admitting: Pain Medicine

## 2020-07-05 DIAGNOSIS — E1165 Type 2 diabetes mellitus with hyperglycemia: Secondary | ICD-10-CM | POA: Diagnosis not present

## 2020-07-05 DIAGNOSIS — Z79899 Other long term (current) drug therapy: Secondary | ICD-10-CM | POA: Diagnosis not present

## 2020-07-05 DIAGNOSIS — I10 Essential (primary) hypertension: Secondary | ICD-10-CM | POA: Diagnosis not present

## 2020-07-05 DIAGNOSIS — F419 Anxiety disorder, unspecified: Secondary | ICD-10-CM | POA: Diagnosis not present

## 2020-07-05 DIAGNOSIS — R635 Abnormal weight gain: Secondary | ICD-10-CM | POA: Diagnosis not present

## 2020-07-05 DIAGNOSIS — Z20822 Contact with and (suspected) exposure to covid-19: Secondary | ICD-10-CM | POA: Diagnosis not present

## 2020-07-05 DIAGNOSIS — E559 Vitamin D deficiency, unspecified: Secondary | ICD-10-CM | POA: Diagnosis not present

## 2020-07-05 DIAGNOSIS — E7800 Pure hypercholesterolemia, unspecified: Secondary | ICD-10-CM | POA: Diagnosis not present

## 2020-07-05 MED FILL — VIT D2 1.25 MG (50,000 UNIT: 1.25 MG | 84 days supply | Qty: 12 | Fill #1

## 2020-07-05 MED FILL — TRULICITY 1.5 MG/0.5 ML PEN: 1.5 | 84 days supply | Qty: 6 | Fill #0

## 2020-07-05 MED FILL — METFORMIN HCL 500 MG TABS: 500 | 90 days supply | Qty: 360 | Fill #0

## 2020-07-05 MED FILL — HYDROXYZINE PAM 50 MG CAP: 50 | 30 days supply | Qty: 30 | Fill #0

## 2020-07-18 ENCOUNTER — Other Ambulatory Visit: Payer: Self-pay | Admitting: Cardiology

## 2020-07-18 ENCOUNTER — Other Ambulatory Visit (HOSPITAL_COMMUNITY): Payer: Self-pay | Admitting: Cardiology

## 2020-07-18 DIAGNOSIS — I10 Essential (primary) hypertension: Secondary | ICD-10-CM

## 2020-07-18 MED ORDER — HYDROCHLOROTHIAZIDE 25 MG PO TABS: 25.0000 mg | ORAL_TABLET | Freq: Every morning | ORAL | 1 refills | Status: DC

## 2020-07-18 MED ORDER — SPIRONOLACTONE 25 MG PO TABS: 25.0000 mg | ORAL_TABLET | Freq: Every morning | ORAL | 0 refills | Status: DC

## 2020-07-18 MED FILL — SPIRONOLACTONE 25 MG TABS: 25 | 30 days supply | Qty: 30 | Fill #0

## 2020-07-18 MED FILL — HYDROCHLOROTHIAZIDE 25 MG T: 25 | 90 days supply | Qty: 90 | Fill #0

## 2020-07-18 NOTE — Telephone Encounter (Signed)
I refilled HCTZ.  Sent in new script for  Aldactone.  New labs in one week to check kidney function and potassium labs at [facility].

## 2020-07-18 NOTE — Telephone Encounter (Signed)
From pt

## 2020-08-24 ENCOUNTER — Other Ambulatory Visit: Payer: 59

## 2020-09-11 ENCOUNTER — Other Ambulatory Visit (HOSPITAL_COMMUNITY): Payer: Self-pay | Admitting: Cardiology

## 2020-09-11 ENCOUNTER — Other Ambulatory Visit: Payer: Self-pay

## 2020-09-11 DIAGNOSIS — I10 Essential (primary) hypertension: Secondary | ICD-10-CM

## 2020-09-11 MED ORDER — SPIRONOLACTONE 25 MG PO TABS: 25.0000 mg | ORAL_TABLET | Freq: Every morning | ORAL | 1 refills | Status: DC

## 2020-09-11 MED ORDER — HYDROCHLOROTHIAZIDE 25 MG PO TABS: 25.0000 mg | ORAL_TABLET | Freq: Every morning | ORAL | 1 refills | Status: DC

## 2020-09-11 MED FILL — SPIRONOLACTONE 25 MG TABS: 25 | 90 days supply | Qty: 90 | Fill #0

## 2020-10-09 DIAGNOSIS — N915 Oligomenorrhea, unspecified: Secondary | ICD-10-CM | POA: Diagnosis not present

## 2020-10-09 DIAGNOSIS — E282 Polycystic ovarian syndrome: Secondary | ICD-10-CM | POA: Diagnosis not present

## 2020-10-09 DIAGNOSIS — E119 Type 2 diabetes mellitus without complications: Secondary | ICD-10-CM | POA: Diagnosis not present

## 2020-10-09 DIAGNOSIS — I10 Essential (primary) hypertension: Secondary | ICD-10-CM | POA: Diagnosis not present

## 2020-10-09 DIAGNOSIS — Z01419 Encounter for gynecological examination (general) (routine) without abnormal findings: Secondary | ICD-10-CM | POA: Diagnosis not present

## 2020-10-11 DIAGNOSIS — Z3200 Encounter for pregnancy test, result unknown: Secondary | ICD-10-CM | POA: Diagnosis not present

## 2020-10-11 DIAGNOSIS — I10 Essential (primary) hypertension: Secondary | ICD-10-CM | POA: Diagnosis not present

## 2020-10-11 DIAGNOSIS — E119 Type 2 diabetes mellitus without complications: Secondary | ICD-10-CM | POA: Diagnosis not present

## 2020-10-11 MED FILL — NIFEdipine ER OSMOTIC RELEA: 30 | 30 days supply | Qty: 30 | Fill #0

## 2020-11-01 DIAGNOSIS — Z3481 Encounter for supervision of other normal pregnancy, first trimester: Secondary | ICD-10-CM | POA: Diagnosis not present

## 2020-11-02 ENCOUNTER — Ambulatory Visit: Payer: 59 | Admitting: Cardiovascular Disease

## 2020-11-02 ENCOUNTER — Other Ambulatory Visit (HOSPITAL_COMMUNITY): Payer: Self-pay | Admitting: Cardiovascular Disease

## 2020-11-02 ENCOUNTER — Encounter: Payer: Self-pay | Admitting: Cardiovascular Disease

## 2020-11-02 ENCOUNTER — Other Ambulatory Visit: Payer: Self-pay

## 2020-11-02 VITALS — BP 128/62 | HR 86 | Ht 61.0 in | Wt 204.4 lb

## 2020-11-02 DIAGNOSIS — E119 Type 2 diabetes mellitus without complications: Secondary | ICD-10-CM | POA: Insufficient documentation

## 2020-11-02 DIAGNOSIS — E669 Obesity, unspecified: Secondary | ICD-10-CM | POA: Diagnosis not present

## 2020-11-02 DIAGNOSIS — I10 Essential (primary) hypertension: Secondary | ICD-10-CM

## 2020-11-02 DIAGNOSIS — E1169 Type 2 diabetes mellitus with other specified complication: Secondary | ICD-10-CM | POA: Insufficient documentation

## 2020-11-02 HISTORY — DX: Essential (primary) hypertension: I10

## 2020-11-02 HISTORY — DX: Type 2 diabetes mellitus with other specified complication: E66.9

## 2020-11-02 HISTORY — DX: Type 2 diabetes mellitus with other specified complication: E11.69

## 2020-11-02 MED ORDER — LABETALOL HCL 100 MG PO TABS: 100.0000 mg | ORAL_TABLET | Freq: Two times a day (BID) | ORAL | 1 refills | Status: DC

## 2020-11-02 NOTE — Progress Notes (Signed)
Advanced Hypertension Clinic Initial Assessment:    Date:  11/02/2020   ID:  Nicole Stuart, DOB 01-Mar-1991, MRN [id number]  PCP:  Murlean Iba, NP  Cardiologist:  No primary care provider on file.  Congenital cardiologist: Dr. Lyndel Safe, [facility]  Referring MD: Maxie Better, MD   CC: Hypertension  History of Present Illness:    Nicole Stuart is a 30 y.o. female [redacted] weeks pregnant with a hx of PDA, hypertension, diabetes, sickle cell trait, and PCOS here to establish care in the advanced hypertension clinic. She was seen by Dr. Cherly Hensen on 3/4 for confirmation of pregnancy.  She was started on nifedipine 30mg  and her spironolactone and HCTZ were disconitnued.   She was first diagnosed with hypertension in 2020.  It occurred during pregnancy.  The pregnancy was complicated by preeclampsia and gestational diabetes.  After delivery both hypertension and diabetes persisted.  She notes that initially in the pregnancy her blood pressure was low but it became increasingly elevated.  She ultimately delivered via cesarean section at 36 weeks.  Her OB/GYN noted that she had a murmur.  She reported that her murmur was longstanding since childhood.   She had an echo in 2020 that revealed LVEF 60 to 65% with normal right ventricular function.  A PDA was noted.  She saw pediatric cardiology at [facility] and given that she was asymptomatic with normal left ventricular function and no evidence of chamber dilation, she was thought to be low risk for vaginal delivery.  Her PDA was thought to be too small and it was unnecessary to close.  Given her low SVR they thought it was unlikely that she would go into heart failure during her pregnancy and recommended annual follow-up.  They did not feel that she needed SBE prophylaxis.  She is also previously been seen by [facility] cardiology for chest pain and had stress test that was unremarkable.  They have also been assisting with her blood pressure management.  Ms.  Gladue notes that since switching blood pressure medication she has not felt as well.  She notes exertional intolerance and her heart rate increases to the 130s with minimal exertion.  Because of this and feeling sleepy she has not been getting much exercise lately.  She was previously going to the gym.  She has no lower extremity edema, orthopnea, or PND.  She does report a history of OSA but had her tonsils and adenoids removed.  Despite this she still snores and her husband reports it is no better.  She does not have any caffeine or alcohol intake.  She is adopted and does not know her family medical history.  Previous antihypertensives: HCTZ Spironolactone Losartan   Past Medical History:  Diagnosis Date  . Asthma   . Diabetes mellitus type 2 in obese (HCC) 11/02/2020  . Dysrhythmia    first degree heart block.   . Essential hypertension 11/02/2020  . Heart murmur   . Hypertension   . Sleep apnea    study done two weeks ago [place]     Past Surgical History:  Procedure Laterality Date  . CESAREAN SECTION    . NASAL SEPTOPLASTY W/ TURBINOPLASTY  09/22/2011   Procedure: NASAL SEPTOPLASTY WITH TURBINATE REDUCTION;  Surgeon: 11/20/2011, MD;  Location: [facility] OR;  Service: ENT;  Laterality: Bilateral;  . TONSILLECTOMY  09/22/2011   Procedure: TONSILLECTOMY;  Surgeon: 11/20/2011, MD;  Location: [facility] OR;  Service: ENT;  Laterality: Bilateral;  Current Medications: Current Meds  Medication Sig  . labetalol (NORMODYNE) 100 MG tablet Take 1 tablet (100 mg total) by mouth 2 (two) times daily.  . metFORMIN (GLUCOPHAGE) 500 MG tablet Take 1,000 mg by mouth 2 (two) times daily.  . Multiple Vitamins-Minerals (MULTIVITAMIN WITH MINERALS) tablet Take 1 tablet by mouth daily.  . [DISCONTINUED] NIFEdipine (PROCARDIA XL/NIFEDICAL XL) 60 MG 24 hr tablet Take 60 mg by mouth daily.     Allergies:   Pineapple   Social History   Socioeconomic History  . Marital status: Married     Spouse name: Not on file  . Number of children: Not on file  . Years of education: Not on file  . Highest education level: Not on file  Occupational History  . Not on file  Tobacco Use  . Smoking status: Never Smoker  . Smokeless tobacco: Never Used  Vaping Use  . Vaping Use: Never used  Substance and Sexual Activity  . Alcohol use: No  . Drug use: No  . Sexual activity: Yes  Other Topics Concern  . Not on file  Social History Narrative  . Not on file   Social Determinants of Health   Financial Resource Strain: Not on file  Food Insecurity: No Food Insecurity  . Worried About Programme researcher, broadcasting/film/video in the Last Year: Never true  . Ran Out of Food in the Last Year: Never true  Transportation Needs: No Transportation Needs  . Lack of Transportation (Medical): No  . Lack of Transportation (Non-Medical): No  Physical Activity: Insufficiently Active  . Days of Exercise per Week: 2 days  . Minutes of Exercise per Session: 60 min  Stress: No Stress Concern Present  . Feeling of Stress : Not at all  Social Connections: Not on file     Family History: The patient's family history includes Diabetes in her mother; Hypertension in her brother and sister; Obesity in her mother. She was adopted.  ROS:   Please see the history of present illness.    All other systems reviewed and are negative.  EKGs/Labs/Other Studies Reviewed:    EKG:  EKG is ordered today.  The ekg ordered today demonstrates sinus rhythm/sinus arrhythmia.  Rate 86 bpm.  Echo 02/23/19 [facility]): Summary  There is no prior echocardiogram for comparison. Subcostal images not obtained  due to pregnancy. A patent ductus arteriousus is suspected.  Tricuspid valve is structurally normal.  No evidence of tricuspid stenosis.  Trace tricuspid regurgitation.  Pulmonic valve is structurally normal.  No evidence of pulmonic stenosis.  Trace pulmonic regurgitation.  Normal left ventricular size and systolic function with no  appreciable  segmental abnormality.  Ejection fraction is visually estimated at 60-65%.  Diastolic function is normal.  Normal left ventricular wall thickness.  Normal size right atrium.  Normal right ventricular size and function.  The aortic root diameter is within normal limits.  The IVC was not visualized ([redacted] weeks gestation).  A patent ductus arteriosus is suspected.  The interatrial septum appears intact by color flow Doppler.    Recent Labs: 06/22/2020: BUN 7; Creatinine, Ser 0.56; Magnesium 1.8; Potassium 3.9; Sodium 137   Recent Lipid Panel No results found for: CHOL, TRIG, HDL, CHOLHDL, VLDL, LDLCALC, LDLDIRECT  Physical Exam:   VS:  BP 128/62 (BP Location: Right Arm, Patient Position: Sitting, Cuff Size: Large)   Pulse 86   Ht 5\' 1"  (1.549 m)   Wt 204 lb 6.4 oz (92.7 kg)   BMI 38.62 kg/m  ,  BMI Body mass index is 38.62 kg/m. GENERAL:  Well appearing HEENT: Pupils equal round and reactive, fundi not visualized, oral mucosa unremarkable NECK:  No jugular venous distention, waveform within normal limits, carotid upstroke brisk and symmetric, no bruits LUNGS:  Clear to auscultation bilaterally HEART:  RRR.  PMI not displaced or sustained,S1 and S2 within normal limits, no S3, no S4, no clicks, no rubs, II/VI continuous murmur ABD:  Gravid uterus, positive bowel sounds normal in frequency in pitch, no bruits, no rebound, no guarding, no midline pulsatile mass, no hepatomegaly, no splenomegaly EXT:  2 plus pulses throughout, no edema, no cyanosis no clubbing SKIN:  No rashes no nodules NEURO:  Cranial nerves II through XII grossly intact, motor grossly intact throughout PSYCH:  Cognitively intact, oriented to person place and time   ASSESSMENT:    1. Essential hypertension   2. Diabetes mellitus type 2 in obese [facility])     PLAN:    # Chronic hypertension: # 1st trimester pregnancy: # h/o pre-eclampsia:  Ms. Kotowski has a history of chronic hypertension that predated  her pregnancy.  She has appropriately been switched onto medications that are acceptable for use during pregnancy.  However, she is reporting palpitations and not feeling well.  We will stop nifedipine and switch to labetalol 100 mg twice daily.  Hopefully this will help with her palpitations.  She will track her blood pressures and let us know if they are running greater than 140/90.  She was treated surgically for sleep apnea but it sounds as though she is still having symptoms.  Would consider repeating her sleep study after delivery and giving her chance for weight loss.  She would like to follow-up with our office for her cardiac care going forward.  We will be sure to coordinate with her congenital cardiologist at [facility].  Given her history of preeclampsia she has been instructed to start aspirin 81 mg daily at week 12 per Dr. Cherly Hensen.  # PDA:   Small on echo and no evidence of hemodynamic consequences previously.  She tolerated pregnancy well.  This in and of itself does not necessarily require her to have a cesarean section.  We will repeat an echo 4 months.  Blood pressure control as above.   Disposition:    FU with MD/PharmD in 1 month    Medication Adjustments/Labs and Tests Ordered: Current medicines are reviewed at length with the patient today.  Concerns regarding medicines are outlined above.  Orders Placed This Encounter  Procedures  . EKG 12-Lead   Meds ordered this encounter  Medications  . labetalol (NORMODYNE) 100 MG tablet    Sig: Take 1 tablet (100 mg total) by mouth 2 (two) times daily.    Dispense:  180 tablet    Refill:  1    D/C NIFEDIPINE     Signed, Chilton Si, MD  11/02/2020 1:05 PM    [facility]

## 2020-11-02 NOTE — Patient Instructions (Addendum)
Medication Instructions:  STOP NIFEDIPINE   START LABETALOL 100 MG TWICE A DAY    Labwork: NONE    Testing/Procedures: NONE    Follow-Up: 12/12/2020 AT 4:00 PM    Special Instructions:   MONITOR YOUR BLOOD PRESSURE TWICE A DAY, LOG IN THE BOOK PROVIDED. BRING THE BOOK AND YOUR BLOOD PRESSURE MACHINE TO YOUR FOLLOW UP IN 1 MONTH    DASH Eating Plan DASH stands for "Dietary Approaches to Stop Hypertension." The DASH eating plan is a healthy eating plan that has been shown to reduce high blood pressure (hypertension). It may also reduce your risk for type 2 diabetes, heart disease, and stroke. The DASH eating plan may also help with weight loss. What are tips for following this plan?  General guidelines  Avoid eating more than 2,300 mg (milligrams) of salt (sodium) a day. If you have hypertension, you may need to reduce your sodium intake to 1,500 mg a day.  Limit alcohol intake to no more than 1 drink a day for nonpregnant women and 2 drinks a day for men. One drink equals 12 oz of beer, 5 oz of wine, or 1 oz of hard liquor.  Work with your health care provider to maintain a healthy body weight or to lose weight. Ask what an ideal weight is for you.  Get at least 30 minutes of exercise that causes your heart to beat faster (aerobic exercise) most days of the week. Activities may include walking, swimming, or biking.  Work with your health care provider or diet and nutrition specialist (dietitian) to adjust your eating plan to your individual calorie needs. Reading food labels   Check food labels for the amount of sodium per serving. Choose foods with less than 5 percent of the Daily Value of sodium. Generally, foods with less than 300 mg of sodium per serving fit into this eating plan.  To find whole grains, look for the word "whole" as the first word in the ingredient list. Shopping  Buy products labeled as "low-sodium" or "no salt added."  Buy fresh foods. Avoid canned  foods and premade or frozen meals. Cooking  Avoid adding salt when cooking. Use salt-free seasonings or herbs instead of table salt or sea salt. Check with your health care provider or pharmacist before using salt substitutes.  Do not fry foods. Cook foods using healthy methods such as baking, boiling, grilling, and broiling instead.  Cook with heart-healthy oils, such as olive, canola, soybean, or sunflower oil. Meal planning  Eat a balanced diet that includes: ? 5 or more servings of fruits and vegetables each day. At each meal, try to fill half of your plate with fruits and vegetables. ? Up to 6-8 servings of whole grains each day. ? Less than 6 oz of lean meat, poultry, or fish each day. A 3-oz serving of meat is about the same size as a deck of cards. One egg equals 1 oz. ? 2 servings of low-fat dairy each day. ? A serving of nuts, seeds, or beans 5 times each week. ? Heart-healthy fats. Healthy fats called Omega-3 fatty acids are found in foods such as flaxseeds and coldwater fish, like sardines, salmon, and mackerel.  Limit how much you eat of the following: ? Canned or prepackaged foods. ? Food that is high in trans fat, such as fried foods. ? Food that is high in saturated fat, such as fatty meat. ? Sweets, desserts, sugary drinks, and other foods with added sugar. ? Full-fat dairy  products.  Do not salt foods before eating.  Try to eat at least 2 vegetarian meals each week.  Eat more home-cooked food and less restaurant, buffet, and fast food.  When eating at a restaurant, ask that your food be prepared with less salt or no salt, if possible. What foods are recommended? The items listed may not be a complete list. Talk with your dietitian about what dietary choices are best for you. Grains Whole-grain or whole-wheat bread. Whole-grain or whole-wheat pasta. Brown rice. [place]. Bulgur. Whole-grain and low-sodium cereals. Pita bread. Low-fat, low-sodium crackers.  Whole-wheat flour tortillas. Vegetables Fresh or frozen vegetables (raw, steamed, roasted, or grilled). Low-sodium or reduced-sodium tomato and vegetable juice. Low-sodium or reduced-sodium tomato sauce and tomato paste. Low-sodium or reduced-sodium canned vegetables. Fruits All fresh, dried, or frozen fruit. Canned fruit in natural juice (without added sugar). Meat and other protein foods Skinless chicken or [place]. Ground chicken or [place]. Pork with fat trimmed off. Fish and seafood. Egg whites. Dried beans, peas, or lentils. Unsalted nuts, nut butters, and seeds. Unsalted canned beans. Lean cuts of beef with fat trimmed off. Low-sodium, lean deli meat. Dairy Low-fat (1%) or fat-free (skim) milk. Fat-free, low-fat, or reduced-fat cheeses. Nonfat, low-sodium ricotta or cottage cheese. Low-fat or nonfat yogurt. Low-fat, low-sodium cheese. Fats and oils Soft margarine without trans fats. Vegetable oil. Low-fat, reduced-fat, or light mayonnaise and salad dressings (reduced-sodium). Canola, safflower, olive, soybean, and sunflower oils. Avocado. Seasoning and other foods Herbs. Spices. Seasoning mixes without salt. Unsalted popcorn and pretzels. Fat-free sweets. What foods are not recommended? The items listed may not be a complete list. Talk with your dietitian about what dietary choices are best for you. Grains Baked goods made with fat, such as croissants, muffins, or some breads. Dry pasta or rice meal packs. Vegetables Creamed or fried vegetables. Vegetables in a cheese sauce. Regular canned vegetables (not low-sodium or reduced-sodium). Regular canned tomato sauce and paste (not low-sodium or reduced-sodium). Regular tomato and vegetable juice (not low-sodium or reduced-sodium). Angie Fava. Olives. Fruits Canned fruit in a light or heavy syrup. Fried fruit. Fruit in cream or butter sauce. Meat and other protein foods Fatty cuts of meat. Ribs. Fried meat. Berniece Salines. Sausage. Bologna and other  processed lunch meats. Salami. Fatback. Hotdogs. Bratwurst. Salted nuts and seeds. Canned beans with added salt. Canned or smoked fish. Whole eggs or egg yolks. Chicken or [place] with skin. Dairy Whole or 2% milk, cream, and half-and-half. Whole or full-fat cream cheese. Whole-fat or sweetened yogurt. Full-fat cheese. Nondairy creamers. Whipped toppings. Processed cheese and cheese spreads. Fats and oils Butter. Stick margarine. Lard. Shortening. Ghee. Bacon fat. Tropical oils, such as coconut, palm kernel, or palm oil. Seasoning and other foods Salted popcorn and pretzels. Onion salt, garlic salt, seasoned salt, table salt, and sea salt. Worcestershire sauce. Tartar sauce. Barbecue sauce. Teriyaki sauce. Soy sauce, including reduced-sodium. Steak sauce. Canned and packaged gravies. Fish sauce. Oyster sauce. Cocktail sauce. Horseradish that you find on the shelf. Ketchup. Mustard. Meat flavorings and tenderizers. Bouillon cubes. Hot sauce and Tabasco sauce. Premade or packaged marinades. Premade or packaged taco seasonings. Relishes. Regular salad dressings. Where to find more information:  National Heart, Lung, and [place]: https://wilson-eaton.com/  American Heart Association: www.heart.org Summary  The DASH eating plan is a healthy eating plan that has been shown to reduce high blood pressure (hypertension). It may also reduce your risk for type 2 diabetes, heart disease, and stroke.  With the DASH eating plan, you should limit  salt (sodium) intake to 2,300 mg a day. If you have hypertension, you may need to reduce your sodium intake to 1,500 mg a day.  When on the DASH eating plan, aim to eat more fresh fruits and vegetables, whole grains, lean proteins, low-fat dairy, and heart-healthy fats.  Work with your health care provider or diet and nutrition specialist (dietitian) to adjust your eating plan to your individual calorie needs. This information is not intended to replace advice given to  you by your health care provider. Make sure you discuss any questions you have with your health care provider. Document Released: 07/24/2011 Document Revised: 07/17/2017 Document Reviewed: 07/28/2016 Elsevier Patient Education  2020 [employer].

## 2020-11-06 DIAGNOSIS — I10 Essential (primary) hypertension: Secondary | ICD-10-CM | POA: Diagnosis not present

## 2020-11-06 DIAGNOSIS — E1165 Type 2 diabetes mellitus with hyperglycemia: Secondary | ICD-10-CM | POA: Diagnosis not present

## 2020-11-06 DIAGNOSIS — E119 Type 2 diabetes mellitus without complications: Secondary | ICD-10-CM | POA: Diagnosis not present

## 2020-11-06 DIAGNOSIS — O34219 Maternal care for unspecified type scar from previous cesarean delivery: Secondary | ICD-10-CM | POA: Diagnosis not present

## 2020-11-06 DIAGNOSIS — Q250 Patent ductus arteriosus: Secondary | ICD-10-CM | POA: Diagnosis not present

## 2020-11-06 DIAGNOSIS — Z6836 Body mass index (BMI) 36.0-36.9, adult: Secondary | ICD-10-CM | POA: Diagnosis not present

## 2020-11-06 DIAGNOSIS — O10911 Unspecified pre-existing hypertension complicating pregnancy, first trimester: Secondary | ICD-10-CM | POA: Diagnosis not present

## 2020-11-06 DIAGNOSIS — O24319 Unspecified pre-existing diabetes mellitus in pregnancy, unspecified trimester: Secondary | ICD-10-CM | POA: Diagnosis not present

## 2020-11-06 DIAGNOSIS — O24111 Pre-existing diabetes mellitus, type 2, in pregnancy, first trimester: Secondary | ICD-10-CM | POA: Diagnosis not present

## 2020-11-06 DIAGNOSIS — Z3A11 11 weeks gestation of pregnancy: Secondary | ICD-10-CM | POA: Diagnosis not present

## 2020-11-06 DIAGNOSIS — Z3481 Encounter for supervision of other normal pregnancy, first trimester: Secondary | ICD-10-CM | POA: Diagnosis not present

## 2020-11-06 DIAGNOSIS — D573 Sickle-cell trait: Secondary | ICD-10-CM | POA: Diagnosis not present

## 2020-11-06 DIAGNOSIS — E282 Polycystic ovarian syndrome: Secondary | ICD-10-CM | POA: Diagnosis not present

## 2020-11-06 LAB — OB RESULTS CONSOLE GC/CHLAMYDIA
Chlamydia: NEGATIVE
Gonorrhea: NEGATIVE

## 2020-11-06 LAB — OB RESULTS CONSOLE HIV ANTIBODY (ROUTINE TESTING): HIV: NONREACTIVE

## 2020-11-06 LAB — OB RESULTS CONSOLE ABO/RH: RH Type: POSITIVE

## 2020-11-06 LAB — OB RESULTS CONSOLE RPR: RPR: NONREACTIVE

## 2020-11-06 LAB — OB RESULTS CONSOLE HEPATITIS B SURFACE ANTIGEN: Hepatitis B Surface Ag: NEGATIVE

## 2020-11-06 LAB — OB RESULTS CONSOLE RUBELLA ANTIBODY, IGM: Rubella: IMMUNE

## 2020-11-06 LAB — OB RESULTS CONSOLE ANTIBODY SCREEN: Antibody Screen: NEGATIVE

## 2020-11-15 ENCOUNTER — Other Ambulatory Visit (HOSPITAL_COMMUNITY): Payer: Self-pay | Admitting: Internal Medicine

## 2020-11-16 ENCOUNTER — Telehealth: Payer: 59 | Admitting: Emergency Medicine

## 2020-11-16 ENCOUNTER — Encounter: Payer: Self-pay | Admitting: Emergency Medicine

## 2020-11-16 VITALS — Temp 99.1°F

## 2020-11-16 DIAGNOSIS — R399 Unspecified symptoms and signs involving the genitourinary system: Secondary | ICD-10-CM | POA: Diagnosis not present

## 2020-11-16 MED ORDER — CEPHALEXIN 500 MG PO CAPS: 500.0000 mg | ORAL_CAPSULE | Freq: Two times a day (BID) | ORAL | 0 refills | Status: AC

## 2020-11-16 NOTE — Progress Notes (Signed)
Nicole Stuart, Nicole Stuart are scheduled for a virtual visit with your provider today.    Just as we do with appointments in the office, we must obtain your consent to participate.  Your consent will be active for this visit and any virtual visit you may have with one of our providers in the next 365 days.    If you have a MyChart account, I can also send a copy of this consent to you electronically.  All virtual visits are billed to your insurance company just like a traditional visit in the office.  As this is a virtual visit, video technology does not allow for your provider to perform a traditional examination.  This may limit your provider's ability to fully assess your condition.  If your provider identifies any concerns that need to be evaluated in person or the need to arrange testing such as labs, EKG, etc, we will make arrangements to do so.    Although advances in technology are sophisticated, we cannot ensure that it will always work on either your end or our end.  If the connection with a video visit is poor, we may have to switch to a telephone visit.  With either a video or telephone visit, we are not always able to ensure that we have a secure connection.   I need to obtain your verbal consent now.   Are you willing to proceed with your visit today?   Nicole Stuart has provided verbal consent on 11/16/2020 for a virtual visit (video or telephone).   Nicole Shadow, PA-C 11/16/2020  6:03 PM   Date:  11/16/2020   ID:  [name], DOB Sep 08, 1990, MRN [id number]  Patient Location: Home Provider Location: Home Office   Participants: Patient and Provider for Visit and Wrap up  Method of visit: Video  Location of Patient: Home Location of Provider: Home Office Consent was obtain for visit over the video. Services rendered by provider: Visit was performed via video  A video enabled telemedicine application was used and I verified that I am speaking with the correct person using two  identifiers.  PCP:  Murlean Iba, NP   Chief Complaint:  Dysuria, frequency  History of Present Illness:    Nicole Stuart is a 30 y.o. female with history as stated below. Presents video telehealth for an acute care visit for urinary symptoms. Pt notes she is [redacted] weeks pregnant.  She was seen by OB/GYN Dr. Cherly Hensen on 10/19/2020 and is doing well.  She reports receiving routine prenatal care as well as close follow up for her diabetes. History of similar symptoms about 1 year ago, was treated empirically with Macrobid, symptoms resolved so she did not follow up.   Onset of symptoms was last night and symptoms have been persistent and include: dysuria, frequency, cloudy urine, low grade fever of 99.1*F today.   Denies having fevers, chills, shortness of breath, cough, chest pain, ear pain, sore throat or exposure to covid or other sick contacts.   Modifying factors include: she has not tried any medications or home treatments for symptoms yet.   No other aggravating or relieving factors.  No other c/o.   The patient does not have symptoms concerning for COVID-19 infection (fever, chills, cough, or new shortness of breath).  Patient not been tested for COVID during this illness.  Past Medical, Surgical, Social History, Allergies, and Medications have been Reviewed.  Patient Active Problem List   Diagnosis Date Noted  . Diabetes  mellitus type 2 in obese (HCC) 11/02/2020  . Essential hypertension 11/02/2020  . Sickle cell trait in mother affecting pregnancy (HCC) 03/25/2019  . Patent ductus arteriosus 03/07/2019  . Heart murmur 10/11/2014  . Obesity (BMI 30-39.9) 10/11/2014    Social History   Tobacco Use  . Smoking status: Never Smoker  . Smokeless tobacco: Never Used  Substance Use Topics  . Alcohol use: No     Current Outpatient Medications:  .  cephALEXin (KEFLEX) 500 MG capsule, Take 1 capsule (500 mg total) by mouth 2 (two) times daily for 7 days., Disp: 14 capsule, Rfl:  0 .  labetalol (NORMODYNE) 100 MG tablet, Take 1 tablet (100 mg total) by mouth 2 (two) times daily., Disp: 180 tablet, Rfl: 1 .  metFORMIN (GLUCOPHAGE) 500 MG tablet, Take 1,000 mg by mouth 2 (two) times daily., Disp: , Rfl:  .  Multiple Vitamins-Minerals (MULTIVITAMIN WITH MINERALS) tablet, Take 1 tablet by mouth daily., Disp: , Rfl:    Allergies  Allergen Reactions  . Pineapple Rash     Review of Systems  Constitutional: Positive for fever. Negative for chills.  Gastrointestinal: Negative for abdominal pain, nausea and vomiting.  Genitourinary: Positive for dysuria, frequency and urgency. Negative for flank pain.       Denies vaginal discharge or spotting  Musculoskeletal: Negative for back pain.  Skin: Negative for rash.   See HPI for history of present illness.  Physical Exam Constitutional:      General: She is not in acute distress.    Appearance: Normal appearance. She is obese. She is not ill-appearing, toxic-appearing or diaphoretic.     Comments: Pt resting comfortably at home, speech is clear   Pulmonary:     Effort: Pulmonary effort is normal. No respiratory distress.  Neurological:     General: No focal deficit present.     Mental Status: She is alert.  Psychiatric:        Mood and Affect: Mood normal.        Behavior: Behavior normal.               A&P  1. UTI symptoms  -empiric tx for UTI with cephalexin 500mg  twice daily for 7 days  -encouraged good hydration   -discussed signs/symptoms that warrant in-person evaluation and treatment should symptoms worsen this evening or this weekend.     Patient voiced understanding and agreement to plan.   Time:   Today, I have spent 15 minutes with the patient with telehealth technology discussing the above problems, reviewing the chart, previous notes, medications and orders.    Tests Ordered: No orders of the defined types were placed in this encounter.   Medication Changes: Meds ordered this encounter   Medications  . cephALEXin (KEFLEX) 500 MG capsule    Sig: Take 1 capsule (500 mg total) by mouth 2 (two) times daily for 7 days.    Dispense:  14 capsule    Refill:  0     Disposition:  Follow up with PCP and OB/GYN next week as scheduled. Advised to seek in-person treatment this evening or this weekend if symptoms worsen, or new concerning symptoms develop- severe pain, vaginal symptoms, unable to keep down fluids, dizziness/passing out.   , PA-C  11/16/2020 6:03 PM

## 2020-11-22 ENCOUNTER — Other Ambulatory Visit (HOSPITAL_COMMUNITY): Payer: Self-pay

## 2020-11-22 DIAGNOSIS — E1165 Type 2 diabetes mellitus with hyperglycemia: Secondary | ICD-10-CM | POA: Diagnosis not present

## 2020-11-22 DIAGNOSIS — E282 Polycystic ovarian syndrome: Secondary | ICD-10-CM | POA: Diagnosis not present

## 2020-11-22 DIAGNOSIS — Z3A13 13 weeks gestation of pregnancy: Secondary | ICD-10-CM | POA: Diagnosis not present

## 2020-11-22 DIAGNOSIS — Q250 Patent ductus arteriosus: Secondary | ICD-10-CM | POA: Diagnosis not present

## 2020-11-22 DIAGNOSIS — Z6836 Body mass index (BMI) 36.0-36.9, adult: Secondary | ICD-10-CM | POA: Diagnosis not present

## 2020-11-22 DIAGNOSIS — D573 Sickle-cell trait: Secondary | ICD-10-CM | POA: Diagnosis not present

## 2020-11-22 DIAGNOSIS — O24111 Pre-existing diabetes mellitus, type 2, in pregnancy, first trimester: Secondary | ICD-10-CM | POA: Diagnosis not present

## 2020-11-22 MED FILL — Continuous Glucose System Sensor: 28 days supply | Qty: 2 | Fill #0 | Status: CN

## 2020-11-23 ENCOUNTER — Other Ambulatory Visit (HOSPITAL_COMMUNITY): Payer: Self-pay

## 2020-11-23 MED ORDER — DEXCOM G6 TRANSMITTER MISC
3 refills | Status: DC
  Filled 2020-11-23: qty 3, 30d supply, fill #0
  Filled 2020-11-23: qty 1, 90d supply, fill #0
  Filled 2020-11-23: qty 3, 30d supply, fill #0
  Filled 2021-04-01: qty 1, 90d supply, fill #0

## 2020-11-23 MED ORDER — DEXCOM G6 SENSOR MISC
3 refills | Status: DC
  Filled 2020-11-23 – 2021-04-01 (×3): qty 3, 30d supply, fill #0
  Filled 2021-04-25: qty 3, 30d supply, fill #1

## 2020-11-23 MED FILL — Continuous Glucose System Sensor: 28 days supply | Qty: 2 | Fill #0 | Status: CN

## 2020-11-24 ENCOUNTER — Other Ambulatory Visit (HOSPITAL_COMMUNITY): Payer: Self-pay

## 2020-11-26 ENCOUNTER — Other Ambulatory Visit (HOSPITAL_COMMUNITY): Payer: Self-pay

## 2020-12-07 ENCOUNTER — Other Ambulatory Visit (HOSPITAL_COMMUNITY): Payer: Self-pay

## 2020-12-07 MED FILL — Continuous Glucose System Sensor: 28 days supply | Qty: 2 | Fill #0 | Status: AC

## 2020-12-11 ENCOUNTER — Other Ambulatory Visit: Payer: Self-pay | Admitting: Obstetrics and Gynecology

## 2020-12-11 DIAGNOSIS — O24410 Gestational diabetes mellitus in pregnancy, diet controlled: Secondary | ICD-10-CM

## 2020-12-11 DIAGNOSIS — I10 Essential (primary) hypertension: Secondary | ICD-10-CM

## 2020-12-11 DIAGNOSIS — Z3482 Encounter for supervision of other normal pregnancy, second trimester: Secondary | ICD-10-CM

## 2020-12-11 DIAGNOSIS — Z363 Encounter for antenatal screening for malformations: Secondary | ICD-10-CM

## 2020-12-12 ENCOUNTER — Ambulatory Visit: Payer: 59 | Admitting: Cardiovascular Disease

## 2020-12-15 ENCOUNTER — Other Ambulatory Visit (HOSPITAL_COMMUNITY): Payer: Self-pay

## 2020-12-26 ENCOUNTER — Other Ambulatory Visit (HOSPITAL_COMMUNITY): Payer: Self-pay

## 2020-12-27 ENCOUNTER — Other Ambulatory Visit (HOSPITAL_COMMUNITY): Payer: Self-pay

## 2020-12-27 DIAGNOSIS — Z3A18 18 weeks gestation of pregnancy: Secondary | ICD-10-CM | POA: Diagnosis not present

## 2020-12-27 DIAGNOSIS — O24111 Pre-existing diabetes mellitus, type 2, in pregnancy, first trimester: Secondary | ICD-10-CM | POA: Diagnosis not present

## 2020-12-27 DIAGNOSIS — E282 Polycystic ovarian syndrome: Secondary | ICD-10-CM | POA: Diagnosis not present

## 2020-12-27 DIAGNOSIS — Q250 Patent ductus arteriosus: Secondary | ICD-10-CM | POA: Diagnosis not present

## 2020-12-27 DIAGNOSIS — E1165 Type 2 diabetes mellitus with hyperglycemia: Secondary | ICD-10-CM | POA: Diagnosis not present

## 2020-12-27 DIAGNOSIS — Z6836 Body mass index (BMI) 36.0-36.9, adult: Secondary | ICD-10-CM | POA: Diagnosis not present

## 2020-12-27 DIAGNOSIS — D573 Sickle-cell trait: Secondary | ICD-10-CM | POA: Diagnosis not present

## 2020-12-27 MED ORDER — INSULIN PEN NEEDLE 32G X 4 MM MISC
6 refills | Status: DC
  Filled 2020-12-27: qty 100, 30d supply, fill #0

## 2020-12-27 MED ORDER — NOVOLOG FLEXPEN 100 UNIT/ML ~~LOC~~ SOPN
PEN_INJECTOR | SUBCUTANEOUS | 1 refills | Status: DC
  Filled 2020-12-27: qty 6, 30d supply, fill #0

## 2020-12-28 ENCOUNTER — Other Ambulatory Visit (HOSPITAL_COMMUNITY): Payer: Self-pay

## 2020-12-31 ENCOUNTER — Other Ambulatory Visit (HOSPITAL_COMMUNITY): Payer: Self-pay

## 2021-01-02 ENCOUNTER — Ambulatory Visit: Payer: 59 | Attending: Obstetrics and Gynecology

## 2021-01-02 ENCOUNTER — Other Ambulatory Visit (HOSPITAL_COMMUNITY): Payer: Self-pay

## 2021-01-02 ENCOUNTER — Other Ambulatory Visit: Payer: Self-pay

## 2021-01-02 ENCOUNTER — Ambulatory Visit: Payer: 59 | Admitting: *Deleted

## 2021-01-02 ENCOUNTER — Encounter: Payer: Self-pay | Admitting: *Deleted

## 2021-01-02 VITALS — BP 130/72 | HR 91

## 2021-01-02 DIAGNOSIS — O99212 Obesity complicating pregnancy, second trimester: Secondary | ICD-10-CM | POA: Diagnosis not present

## 2021-01-02 DIAGNOSIS — O34219 Maternal care for unspecified type scar from previous cesarean delivery: Secondary | ICD-10-CM

## 2021-01-02 DIAGNOSIS — O24410 Gestational diabetes mellitus in pregnancy, diet controlled: Secondary | ICD-10-CM | POA: Insufficient documentation

## 2021-01-02 DIAGNOSIS — Z363 Encounter for antenatal screening for malformations: Secondary | ICD-10-CM | POA: Diagnosis not present

## 2021-01-02 DIAGNOSIS — O24119 Pre-existing diabetes mellitus, type 2, in pregnancy, unspecified trimester: Secondary | ICD-10-CM | POA: Insufficient documentation

## 2021-01-02 DIAGNOSIS — Z3A19 19 weeks gestation of pregnancy: Secondary | ICD-10-CM

## 2021-01-02 DIAGNOSIS — Q250 Patent ductus arteriosus: Secondary | ICD-10-CM

## 2021-01-02 DIAGNOSIS — O10012 Pre-existing essential hypertension complicating pregnancy, second trimester: Secondary | ICD-10-CM | POA: Diagnosis not present

## 2021-01-02 DIAGNOSIS — Z794 Long term (current) use of insulin: Secondary | ICD-10-CM

## 2021-01-02 DIAGNOSIS — E119 Type 2 diabetes mellitus without complications: Secondary | ICD-10-CM

## 2021-01-02 DIAGNOSIS — O24112 Pre-existing diabetes mellitus, type 2, in pregnancy, second trimester: Secondary | ICD-10-CM | POA: Diagnosis not present

## 2021-01-02 DIAGNOSIS — Z3482 Encounter for supervision of other normal pregnancy, second trimester: Secondary | ICD-10-CM | POA: Diagnosis not present

## 2021-01-02 DIAGNOSIS — E669 Obesity, unspecified: Secondary | ICD-10-CM

## 2021-01-02 DIAGNOSIS — I10 Essential (primary) hypertension: Secondary | ICD-10-CM | POA: Insufficient documentation

## 2021-01-02 DIAGNOSIS — O99412 Diseases of the circulatory system complicating pregnancy, second trimester: Secondary | ICD-10-CM | POA: Diagnosis not present

## 2021-01-02 DIAGNOSIS — Z862 Personal history of diseases of the blood and blood-forming organs and certain disorders involving the immune mechanism: Secondary | ICD-10-CM

## 2021-01-02 MED ORDER — INSULIN PEN NEEDLE 32G X 4 MM MISC
6 refills | Status: DC
  Filled 2021-01-02: qty 100, 25d supply, fill #0

## 2021-01-03 ENCOUNTER — Other Ambulatory Visit (HOSPITAL_COMMUNITY): Payer: Self-pay

## 2021-01-03 ENCOUNTER — Other Ambulatory Visit: Payer: Self-pay | Admitting: Obstetrics and Gynecology

## 2021-01-03 DIAGNOSIS — O24112 Pre-existing diabetes mellitus, type 2, in pregnancy, second trimester: Secondary | ICD-10-CM

## 2021-01-03 DIAGNOSIS — O10019 Pre-existing essential hypertension complicating pregnancy, unspecified trimester: Secondary | ICD-10-CM

## 2021-01-04 ENCOUNTER — Other Ambulatory Visit (HOSPITAL_COMMUNITY): Payer: Self-pay

## 2021-01-09 ENCOUNTER — Other Ambulatory Visit (HOSPITAL_COMMUNITY): Payer: Self-pay

## 2021-01-10 ENCOUNTER — Other Ambulatory Visit (HOSPITAL_COMMUNITY): Payer: Self-pay

## 2021-01-15 ENCOUNTER — Other Ambulatory Visit (HOSPITAL_COMMUNITY): Payer: Self-pay

## 2021-01-15 DIAGNOSIS — Z3A20 20 weeks gestation of pregnancy: Secondary | ICD-10-CM | POA: Diagnosis not present

## 2021-01-15 DIAGNOSIS — E119 Type 2 diabetes mellitus without complications: Secondary | ICD-10-CM | POA: Diagnosis not present

## 2021-01-15 DIAGNOSIS — Z794 Long term (current) use of insulin: Secondary | ICD-10-CM | POA: Diagnosis not present

## 2021-01-15 DIAGNOSIS — O24112 Pre-existing diabetes mellitus, type 2, in pregnancy, second trimester: Secondary | ICD-10-CM | POA: Diagnosis not present

## 2021-01-15 MED FILL — Metformin HCl Tab 500 MG: ORAL | 90 days supply | Qty: 360 | Fill #0 | Status: AC

## 2021-01-15 MED FILL — Continuous Glucose System Sensor: 28 days supply | Qty: 2 | Fill #1 | Status: AC

## 2021-01-18 DIAGNOSIS — O34219 Maternal care for unspecified type scar from previous cesarean delivery: Secondary | ICD-10-CM | POA: Diagnosis not present

## 2021-01-18 DIAGNOSIS — R1110 Vomiting, unspecified: Secondary | ICD-10-CM | POA: Diagnosis not present

## 2021-01-18 DIAGNOSIS — D573 Sickle-cell trait: Secondary | ICD-10-CM | POA: Diagnosis not present

## 2021-01-18 DIAGNOSIS — Z3482 Encounter for supervision of other normal pregnancy, second trimester: Secondary | ICD-10-CM | POA: Diagnosis not present

## 2021-01-18 DIAGNOSIS — O10912 Unspecified pre-existing hypertension complicating pregnancy, second trimester: Secondary | ICD-10-CM | POA: Diagnosis not present

## 2021-01-18 DIAGNOSIS — D582 Other hemoglobinopathies: Secondary | ICD-10-CM | POA: Diagnosis not present

## 2021-01-18 DIAGNOSIS — O24319 Unspecified pre-existing diabetes mellitus in pregnancy, unspecified trimester: Secondary | ICD-10-CM | POA: Diagnosis not present

## 2021-01-30 ENCOUNTER — Ambulatory Visit: Payer: 59 | Admitting: *Deleted

## 2021-01-30 ENCOUNTER — Encounter: Payer: Self-pay | Admitting: *Deleted

## 2021-01-30 ENCOUNTER — Ambulatory Visit: Payer: 59 | Attending: Obstetrics and Gynecology

## 2021-01-30 ENCOUNTER — Other Ambulatory Visit: Payer: Self-pay

## 2021-01-30 VITALS — BP 132/73 | HR 91

## 2021-01-30 DIAGNOSIS — Z363 Encounter for antenatal screening for malformations: Secondary | ICD-10-CM | POA: Diagnosis not present

## 2021-01-30 DIAGNOSIS — E669 Obesity, unspecified: Secondary | ICD-10-CM

## 2021-01-30 DIAGNOSIS — Q250 Patent ductus arteriosus: Secondary | ICD-10-CM

## 2021-01-30 DIAGNOSIS — Z3A23 23 weeks gestation of pregnancy: Secondary | ICD-10-CM

## 2021-01-30 DIAGNOSIS — O10012 Pre-existing essential hypertension complicating pregnancy, second trimester: Secondary | ICD-10-CM

## 2021-01-30 DIAGNOSIS — O99212 Obesity complicating pregnancy, second trimester: Secondary | ICD-10-CM

## 2021-01-30 DIAGNOSIS — O10019 Pre-existing essential hypertension complicating pregnancy, unspecified trimester: Secondary | ICD-10-CM | POA: Diagnosis not present

## 2021-01-30 DIAGNOSIS — O34219 Maternal care for unspecified type scar from previous cesarean delivery: Secondary | ICD-10-CM | POA: Diagnosis not present

## 2021-01-30 DIAGNOSIS — E119 Type 2 diabetes mellitus without complications: Secondary | ICD-10-CM

## 2021-01-30 DIAGNOSIS — O321XX0 Maternal care for breech presentation, not applicable or unspecified: Secondary | ICD-10-CM

## 2021-01-30 DIAGNOSIS — Z862 Personal history of diseases of the blood and blood-forming organs and certain disorders involving the immune mechanism: Secondary | ICD-10-CM

## 2021-01-30 DIAGNOSIS — O24112 Pre-existing diabetes mellitus, type 2, in pregnancy, second trimester: Secondary | ICD-10-CM

## 2021-01-30 DIAGNOSIS — Z794 Long term (current) use of insulin: Secondary | ICD-10-CM

## 2021-01-30 DIAGNOSIS — O99412 Diseases of the circulatory system complicating pregnancy, second trimester: Secondary | ICD-10-CM

## 2021-01-31 ENCOUNTER — Other Ambulatory Visit: Payer: Self-pay | Admitting: *Deleted

## 2021-01-31 DIAGNOSIS — O10912 Unspecified pre-existing hypertension complicating pregnancy, second trimester: Secondary | ICD-10-CM

## 2021-02-07 DIAGNOSIS — Z3482 Encounter for supervision of other normal pregnancy, second trimester: Secondary | ICD-10-CM | POA: Diagnosis not present

## 2021-02-07 DIAGNOSIS — D582 Other hemoglobinopathies: Secondary | ICD-10-CM | POA: Diagnosis not present

## 2021-02-07 DIAGNOSIS — O34219 Maternal care for unspecified type scar from previous cesarean delivery: Secondary | ICD-10-CM | POA: Diagnosis not present

## 2021-02-07 DIAGNOSIS — O24319 Unspecified pre-existing diabetes mellitus in pregnancy, unspecified trimester: Secondary | ICD-10-CM | POA: Diagnosis not present

## 2021-02-07 DIAGNOSIS — O10912 Unspecified pre-existing hypertension complicating pregnancy, second trimester: Secondary | ICD-10-CM | POA: Diagnosis not present

## 2021-02-23 ENCOUNTER — Other Ambulatory Visit (HOSPITAL_COMMUNITY): Payer: Self-pay

## 2021-02-23 MED FILL — Continuous Glucose System Sensor: 28 days supply | Qty: 2 | Fill #2 | Status: CN

## 2021-02-23 MED FILL — Continuous Glucose System Sensor: 28 days supply | Qty: 2 | Fill #2 | Status: AC

## 2021-02-23 MED FILL — Hydroxyzine Pamoate Cap 50 MG: ORAL | 30 days supply | Qty: 30 | Fill #0 | Status: AC

## 2021-02-25 ENCOUNTER — Other Ambulatory Visit: Payer: Self-pay

## 2021-02-25 ENCOUNTER — Other Ambulatory Visit: Payer: Self-pay | Admitting: *Deleted

## 2021-02-25 ENCOUNTER — Encounter: Payer: Self-pay | Admitting: *Deleted

## 2021-02-25 ENCOUNTER — Ambulatory Visit: Payer: 59 | Attending: Maternal & Fetal Medicine

## 2021-02-25 ENCOUNTER — Ambulatory Visit: Payer: 59 | Admitting: *Deleted

## 2021-02-25 ENCOUNTER — Other Ambulatory Visit (HOSPITAL_COMMUNITY): Payer: Self-pay

## 2021-02-25 ENCOUNTER — Telehealth: Payer: Self-pay | Admitting: Obstetrics and Gynecology

## 2021-02-25 VITALS — BP 143/69 | HR 98

## 2021-02-25 DIAGNOSIS — O10912 Unspecified pre-existing hypertension complicating pregnancy, second trimester: Secondary | ICD-10-CM | POA: Diagnosis not present

## 2021-02-25 DIAGNOSIS — E119 Type 2 diabetes mellitus without complications: Secondary | ICD-10-CM | POA: Diagnosis not present

## 2021-02-25 DIAGNOSIS — O34219 Maternal care for unspecified type scar from previous cesarean delivery: Secondary | ICD-10-CM | POA: Diagnosis not present

## 2021-02-25 DIAGNOSIS — E669 Obesity, unspecified: Secondary | ICD-10-CM | POA: Diagnosis not present

## 2021-02-25 DIAGNOSIS — O99212 Obesity complicating pregnancy, second trimester: Secondary | ICD-10-CM

## 2021-02-25 DIAGNOSIS — O10012 Pre-existing essential hypertension complicating pregnancy, second trimester: Secondary | ICD-10-CM | POA: Diagnosis not present

## 2021-02-25 DIAGNOSIS — O99412 Diseases of the circulatory system complicating pregnancy, second trimester: Secondary | ICD-10-CM

## 2021-02-25 DIAGNOSIS — I10 Essential (primary) hypertension: Secondary | ICD-10-CM | POA: Insufficient documentation

## 2021-02-25 DIAGNOSIS — O24112 Pre-existing diabetes mellitus, type 2, in pregnancy, second trimester: Secondary | ICD-10-CM

## 2021-02-25 DIAGNOSIS — Z362 Encounter for other antenatal screening follow-up: Secondary | ICD-10-CM

## 2021-02-25 DIAGNOSIS — Z3A26 26 weeks gestation of pregnancy: Secondary | ICD-10-CM

## 2021-02-25 DIAGNOSIS — Z862 Personal history of diseases of the blood and blood-forming organs and certain disorders involving the immune mechanism: Secondary | ICD-10-CM

## 2021-02-25 DIAGNOSIS — Z794 Long term (current) use of insulin: Secondary | ICD-10-CM

## 2021-02-25 DIAGNOSIS — Q250 Patent ductus arteriosus: Secondary | ICD-10-CM

## 2021-02-25 DIAGNOSIS — O10919 Unspecified pre-existing hypertension complicating pregnancy, unspecified trimester: Secondary | ICD-10-CM

## 2021-02-25 NOTE — Progress Notes (Signed)
Patient concerned that blood sugars are not well controlled. Fastings in 120's and PP's 150-160. Also voices concerns that BP is slightly higher than usual, 150/80.

## 2021-02-25 NOTE — Telephone Encounter (Signed)
I called the patient and informed her that I discussed with Dr. Cherly Hensen who feels that her insurance should cover insulin.  Patient informed that her fasting levels are in the 120s and postprandial (all meals) are in the 160s.  I recommended that she starts (discussed with Dr. Cherly Hensen)  Insulin NPH (Novolin) 16 units at night. Humalog 6 units with each meal.  Patient will call or fax her blood glucose weekly to our office (more frequently initially). She understands that insulin will be gradually increased.  Dr. Cherly Hensen will be prescribing her insulin to [facility].

## 2021-03-05 ENCOUNTER — Other Ambulatory Visit (HOSPITAL_COMMUNITY): Payer: Self-pay

## 2021-03-05 MED ORDER — UNIFINE PENTIPS 32G X 4 MM MISC
11 refills | Status: DC
  Filled 2021-03-05: qty 100, 25d supply, fill #0
  Filled 2021-04-25: qty 100, 25d supply, fill #1

## 2021-03-05 MED ORDER — INSULIN ASPART 100 UNIT/ML FLEXPEN
PEN_INJECTOR | SUBCUTANEOUS | 11 refills | Status: DC
  Filled 2021-03-05 (×2): qty 6, 30d supply, fill #0

## 2021-03-06 ENCOUNTER — Other Ambulatory Visit (HOSPITAL_COMMUNITY): Payer: Self-pay

## 2021-03-06 DIAGNOSIS — E282 Polycystic ovarian syndrome: Secondary | ICD-10-CM | POA: Diagnosis not present

## 2021-03-06 DIAGNOSIS — Z6836 Body mass index (BMI) 36.0-36.9, adult: Secondary | ICD-10-CM | POA: Diagnosis not present

## 2021-03-06 DIAGNOSIS — E1165 Type 2 diabetes mellitus with hyperglycemia: Secondary | ICD-10-CM | POA: Diagnosis not present

## 2021-03-06 DIAGNOSIS — D573 Sickle-cell trait: Secondary | ICD-10-CM | POA: Diagnosis not present

## 2021-03-06 DIAGNOSIS — Q250 Patent ductus arteriosus: Secondary | ICD-10-CM | POA: Diagnosis not present

## 2021-03-06 DIAGNOSIS — O24111 Pre-existing diabetes mellitus, type 2, in pregnancy, first trimester: Secondary | ICD-10-CM | POA: Diagnosis not present

## 2021-03-06 DIAGNOSIS — Z3A18 18 weeks gestation of pregnancy: Secondary | ICD-10-CM | POA: Diagnosis not present

## 2021-03-06 MED ORDER — INSULIN LISPRO (1 UNIT DIAL) 100 UNIT/ML (KWIKPEN)
PEN_INJECTOR | SUBCUTANEOUS | 6 refills | Status: DC
  Filled 2021-03-06: qty 21, 28d supply, fill #0
  Filled 2021-05-02: qty 21, 28d supply, fill #1

## 2021-03-06 MED ORDER — FREESTYLE LIBRE 2 SENSOR MISC: 11 refills | Status: DC

## 2021-03-06 MED ORDER — INSULIN PEN NEEDLE 32G X 4 MM MISC: 6 refills | Status: DC

## 2021-03-07 DIAGNOSIS — O24319 Unspecified pre-existing diabetes mellitus in pregnancy, unspecified trimester: Secondary | ICD-10-CM | POA: Diagnosis not present

## 2021-03-07 DIAGNOSIS — O10913 Unspecified pre-existing hypertension complicating pregnancy, third trimester: Secondary | ICD-10-CM | POA: Diagnosis not present

## 2021-03-07 DIAGNOSIS — O34219 Maternal care for unspecified type scar from previous cesarean delivery: Secondary | ICD-10-CM | POA: Diagnosis not present

## 2021-03-07 DIAGNOSIS — Z3689 Encounter for other specified antenatal screening: Secondary | ICD-10-CM | POA: Diagnosis not present

## 2021-03-07 DIAGNOSIS — O0993 Supervision of high risk pregnancy, unspecified, third trimester: Secondary | ICD-10-CM | POA: Diagnosis not present

## 2021-03-07 DIAGNOSIS — D582 Other hemoglobinopathies: Secondary | ICD-10-CM | POA: Diagnosis not present

## 2021-03-08 ENCOUNTER — Other Ambulatory Visit (HOSPITAL_COMMUNITY): Payer: Self-pay

## 2021-03-08 MED ORDER — LABETALOL HCL 200 MG PO TABS
200.0000 mg | ORAL_TABLET | Freq: Two times a day (BID) | ORAL | 11 refills | Status: DC
  Filled 2021-03-08 – 2021-04-01 (×2): qty 60, 30d supply, fill #0

## 2021-03-16 ENCOUNTER — Other Ambulatory Visit (HOSPITAL_COMMUNITY): Payer: Self-pay

## 2021-03-18 ENCOUNTER — Other Ambulatory Visit (HOSPITAL_COMMUNITY): Payer: Self-pay

## 2021-03-20 ENCOUNTER — Other Ambulatory Visit (HOSPITAL_COMMUNITY): Payer: Self-pay

## 2021-03-20 DIAGNOSIS — E282 Polycystic ovarian syndrome: Secondary | ICD-10-CM | POA: Diagnosis not present

## 2021-03-20 DIAGNOSIS — Z3A18 18 weeks gestation of pregnancy: Secondary | ICD-10-CM | POA: Diagnosis not present

## 2021-03-20 DIAGNOSIS — D573 Sickle-cell trait: Secondary | ICD-10-CM | POA: Diagnosis not present

## 2021-03-20 DIAGNOSIS — E1165 Type 2 diabetes mellitus with hyperglycemia: Secondary | ICD-10-CM | POA: Diagnosis not present

## 2021-03-20 DIAGNOSIS — Z6836 Body mass index (BMI) 36.0-36.9, adult: Secondary | ICD-10-CM | POA: Diagnosis not present

## 2021-03-20 DIAGNOSIS — O24111 Pre-existing diabetes mellitus, type 2, in pregnancy, first trimester: Secondary | ICD-10-CM | POA: Diagnosis not present

## 2021-03-20 DIAGNOSIS — Q250 Patent ductus arteriosus: Secondary | ICD-10-CM | POA: Diagnosis not present

## 2021-03-20 MED ORDER — METFORMIN HCL ER 500 MG PO TB24
ORAL_TABLET | ORAL | 5 refills | Status: DC
  Filled 2021-03-20 – 2021-08-13 (×2): qty 120, 30d supply, fill #0
  Filled 2022-02-10: qty 120, 30d supply, fill #1

## 2021-03-23 ENCOUNTER — Inpatient Hospital Stay (HOSPITAL_BASED_OUTPATIENT_CLINIC_OR_DEPARTMENT_OTHER): Payer: 59

## 2021-03-23 ENCOUNTER — Inpatient Hospital Stay (HOSPITAL_COMMUNITY)
Admission: AD | Admit: 2021-03-23 | Discharge: 2021-03-23 | Disposition: A | Discharge status: Home / Self Care | Payer: 59 | Attending: Obstetrics and Gynecology | Admitting: Obstetrics and Gynecology

## 2021-03-23 ENCOUNTER — Other Ambulatory Visit: Payer: Self-pay

## 2021-03-23 ENCOUNTER — Encounter (HOSPITAL_COMMUNITY): Payer: Self-pay | Admitting: Obstetrics and Gynecology

## 2021-03-23 DIAGNOSIS — Z79899 Other long term (current) drug therapy: Secondary | ICD-10-CM | POA: Insufficient documentation

## 2021-03-23 DIAGNOSIS — O26893 Other specified pregnancy related conditions, third trimester: Secondary | ICD-10-CM

## 2021-03-23 DIAGNOSIS — O24113 Pre-existing diabetes mellitus, type 2, in pregnancy, third trimester: Secondary | ICD-10-CM | POA: Insufficient documentation

## 2021-03-23 DIAGNOSIS — O368930 Maternal care for other specified fetal problems, third trimester, not applicable or unspecified: Secondary | ICD-10-CM | POA: Diagnosis not present

## 2021-03-23 DIAGNOSIS — O368130 Decreased fetal movements, third trimester, not applicable or unspecified: Secondary | ICD-10-CM | POA: Diagnosis not present

## 2021-03-23 DIAGNOSIS — R109 Unspecified abdominal pain: Secondary | ICD-10-CM | POA: Diagnosis not present

## 2021-03-23 DIAGNOSIS — O34211 Maternal care for low transverse scar from previous cesarean delivery: Secondary | ICD-10-CM | POA: Insufficient documentation

## 2021-03-23 DIAGNOSIS — E669 Obesity, unspecified: Secondary | ICD-10-CM

## 2021-03-23 DIAGNOSIS — O34219 Maternal care for unspecified type scar from previous cesarean delivery: Secondary | ICD-10-CM

## 2021-03-23 DIAGNOSIS — Z3A30 30 weeks gestation of pregnancy: Secondary | ICD-10-CM | POA: Insufficient documentation

## 2021-03-23 DIAGNOSIS — O288 Other abnormal findings on antenatal screening of mother: Secondary | ICD-10-CM | POA: Diagnosis not present

## 2021-03-23 DIAGNOSIS — Z7984 Long term (current) use of oral hypoglycemic drugs: Secondary | ICD-10-CM | POA: Diagnosis not present

## 2021-03-23 DIAGNOSIS — Z7982 Long term (current) use of aspirin: Secondary | ICD-10-CM | POA: Diagnosis not present

## 2021-03-23 DIAGNOSIS — O24319 Unspecified pre-existing diabetes mellitus in pregnancy, unspecified trimester: Secondary | ICD-10-CM | POA: Diagnosis not present

## 2021-03-23 DIAGNOSIS — O26899 Other specified pregnancy related conditions, unspecified trimester: Secondary | ICD-10-CM

## 2021-03-23 DIAGNOSIS — Z794 Long term (current) use of insulin: Secondary | ICD-10-CM | POA: Insufficient documentation

## 2021-03-23 DIAGNOSIS — E1169 Type 2 diabetes mellitus with other specified complication: Secondary | ICD-10-CM

## 2021-03-23 DIAGNOSIS — O10913 Unspecified pre-existing hypertension complicating pregnancy, third trimester: Secondary | ICD-10-CM | POA: Diagnosis not present

## 2021-03-23 LAB — CBC
HCT: 31.8 % — ABNORMAL LOW (ref 36.0–46.0)
Hemoglobin: 10.9 g/dL — ABNORMAL LOW (ref 12.0–15.0)
MCH: 24.9 pg — ABNORMAL LOW (ref 26.0–34.0)
MCHC: 34.3 g/dL (ref 30.0–36.0)
MCV: 72.8 fL — ABNORMAL LOW (ref 80.0–100.0)
Platelets: 334 10*3/uL (ref 150–400)
RBC: 4.37 MIL/uL (ref 3.87–5.11)
RDW: 15.8 % — ABNORMAL HIGH (ref 11.5–15.5)
WBC: 11.7 10*3/uL — ABNORMAL HIGH (ref 4.0–10.5)
nRBC: 0 % (ref 0.0–0.2)

## 2021-03-23 LAB — URINALYSIS, ROUTINE W REFLEX MICROSCOPIC
Bilirubin Urine: NEGATIVE
Glucose, UA: NEGATIVE mg/dL
Hgb urine dipstick: NEGATIVE
Ketones, ur: NEGATIVE mg/dL
Leukocytes,Ua: NEGATIVE
Nitrite: NEGATIVE
Protein, ur: NEGATIVE mg/dL
Specific Gravity, Urine: 1.01 (ref 1.005–1.030)
pH: 7 (ref 5.0–8.0)

## 2021-03-23 LAB — COMPREHENSIVE METABOLIC PANEL
ALT: 14 U/L (ref 0–44)
AST: 16 U/L (ref 15–41)
Albumin: 2.6 g/dL — ABNORMAL LOW (ref 3.5–5.0)
Alkaline Phosphatase: 141 U/L — ABNORMAL HIGH (ref 38–126)
Anion gap: 8 (ref 5–15)
BUN: 5 mg/dL — ABNORMAL LOW (ref 6–20)
CO2: 25 mmol/L (ref 22–32)
Calcium: 9.5 mg/dL (ref 8.9–10.3)
Chloride: 102 mmol/L (ref 98–111)
Creatinine, Ser: 0.51 mg/dL (ref 0.44–1.00)
GFR, Estimated: >60 mL/min (ref >60)
Glucose, Bld: 85 mg/dL (ref 70–99)
Potassium: 3.7 mmol/L (ref 3.5–5.1)
Sodium: 135 mmol/L (ref 135–145)
Total Bilirubin: 0.5 mg/dL (ref 0.3–1.2)
Total Protein: 6.9 g/dL (ref 6.5–8.1)

## 2021-03-23 LAB — PROTEIN / CREATININE RATIO, URINE
Creatinine, Urine: 48.76 mg/dL
Protein Creatinine Ratio: 0.12 mg/mg{Cre} (ref 0.00–0.15)
Total Protein, Urine: 6 mg/dL

## 2021-03-23 MED ORDER — LABETALOL HCL 100 MG PO TABS
200.0000 mg | ORAL_TABLET | Freq: Once | ORAL | Status: AC
  Administered 2021-03-23: 200 mg via ORAL
  Filled 2021-03-23: qty 2

## 2021-03-23 MED ORDER — CYCLOBENZAPRINE HCL 5 MG PO TABS
10.0000 mg | ORAL_TABLET | Freq: Once | ORAL | Status: AC
  Administered 2021-03-23: 10 mg via ORAL
  Filled 2021-03-23: qty 2

## 2021-03-23 MED ORDER — CYCLOBENZAPRINE HCL 10 MG PO TABS: 10.0000 mg | ORAL_TABLET | Freq: Three times a day (TID) | ORAL | 0 refills | Status: AC | PRN

## 2021-03-23 NOTE — MAU Note (Signed)
Having sharp pains right above c/s incision. Baby had been really active, then she had a really sharp pain and the baby hasn't been moving now.  Also has had sore throat and a lot of nasal congestion/drainage.  Was tested 4 days ago through [facility]- was neg. Seems to be getting worse.  Child also tested neg for covid, + for strep A.

## 2021-03-23 NOTE — MAU Provider Note (Addendum)
History     Chief Complaint  Patient presents with   Decreased Fetal Movement   Abdominal Pain   30 yo G2P1001 BF hx LTCS now @ 30 3/[redacted] weeks gestation here for c/o lower abdominal pain which started this am over her skin incision. (+) BM today  nl. Denies urinary sx. (+) FM  no vaginal bleeding.  PMH notable for Class B DM, chronic HTN on labetalol  OB History     Gravida  2   Para  1   Term  1   Preterm      AB      Living  1      SAB      IAB      Ectopic      Multiple      Live Births              Past Medical History:  Diagnosis Date   Asthma    Diabetes mellitus type 2 in obese (HCC) 11/02/2020   Dysrhythmia    first degree heart block.    Essential hypertension 11/02/2020   Heart murmur    Hypertension    Patent ductus arteriosus    Sleep apnea    study done two weeks ago [place]     Past Surgical History:  Procedure Laterality Date   CESAREAN SECTION     NASAL SEPTOPLASTY W/ TURBINOPLASTY  09/22/2011   Procedure: NASAL SEPTOPLASTY WITH TURBINATE REDUCTION;  Surgeon: Drema Halon, MD;  Location: [facility] OR;  Service: ENT;  Laterality: Bilateral;   TONSILLECTOMY  09/22/2011   Procedure: TONSILLECTOMY;  Surgeon: Drema Halon, MD;  Location: [facility] OR;  Service: ENT;  Laterality: Bilateral;    Family History  Adopted: Yes  Problem Relation Age of Onset   Diabetes Mother    Obesity Mother    Hypertension Sister    Hypertension Brother     Social History   Tobacco Use   Smoking status: Never   Smokeless tobacco: Never  Vaping Use   Vaping Use: Never used  Substance Use Topics   Alcohol use: No   Drug use: No    Allergies: No Known Allergies  Medications Prior to Admission  Medication Sig Dispense Refill Last Dose   acetaminophen (TYLENOL) 500 MG tablet Take 1,000 mg by mouth every 6 (six) hours as needed.   03/23/2021 at 1630   aspirin EC 81 MG tablet Take 81 mg by mouth daily. Swallow whole.   03/23/2021   Continuous Blood  Gluc Sensor (DEXCOM G6 SENSOR) MISC Use one sensor every 10 days 3 each 3 03/23/2021   Continuous Blood Gluc Sensor (FREESTYLE LIBRE 2 SENSOR) MISC USE AS DIRECTED EVERY 14 DAYS 2 each 3 03/23/2021   insulin aspart (NOVOLOG) 100 UNIT/ML FlexPen Inject into the skin. 3 mL 11 03/23/2021   insulin lispro (HUMALOG KWIKPEN) 100 UNIT/ML KwikPen Inject 5-25 units under the skin 3 times a day with meals 15 mL 6 03/23/2021   metFORMIN (GLUCOPHAGE) 500 MG tablet TAKE 2 TABLETS BY MOUTH TWO TIMES DAILY 360 tablet 1 03/23/2021   metFORMIN (GLUCOPHAGE-XR) 500 MG 24 hr tablet Take 2 tablets by mouth twice a day 120 tablet 5 03/23/2021   Prenatal Vit-Fe Fumarate-FA (PRENATAL VITAMIN PO) Take by mouth.   03/23/2021   Continuous Blood Gluc Sensor (FREESTYLE LIBRE 2 SENSOR) MISC Apply 1 sensor every 14 days 2 each 11    Continuous Blood Gluc Transmit (DEXCOM G6 TRANSMITTER) MISC Use one transmitter  every 90 days 1 each 3    hydrOXYzine (VISTARIL) 50 MG capsule TAKE 1 CAPSULE BY MOUTH DAILY AS NEEDED FOR ANXIETY 30 capsule 1    insulin degludec (TRESIBA FLEXTOUCH) 100 UNIT/ML FlexTouch Pen Inject 10 Units into the skin daily.      Insulin Pen Needle (UNIFINE PENTIPS) 32G X 4 MM MISC Use as directed with insulin.  Patient using 4 pen needles per day 100 each 11    Insulin Pen Needle 32G X 4 MM MISC Use as directed 120 each 6    Insulin Pen Needle 32G X 4 MM MISC Use as directed 4 times a day 120 each 6    Insulin Pen Needle 32G X 4 MM MISC Use as directed with insulin up to 4 times a day 120 each 6    labetalol (NORMODYNE) 100 MG tablet Take 1 tablet (100 mg total) by mouth 2 (two) times daily. 180 tablet 1    labetalol (NORMODYNE) 200 MG tablet Take 1 tablet by mouth 2 times daily. 60 tablet 11      Physical Exam   Blood pressure (!) 146/119, pulse 87, temperature 98.6 F (37 C), temperature source Oral, resp. rate 16, height 5' (1.524 m), weight 91.9 kg, last menstrual period 08/15/2020, SpO2 98 %.  General appearance:  alert, cooperative, and mild distress Lungs: clear to auscultation bilaterally Heart: regular rate and rhythm Abdomen:  gravid soft.. ? Tender lower abdomen Pelvic: external genitalia normal and L/c/OOP Extremities: extremities normal, atraumatic, no cyanosis or edema and no edema, redness or tenderness in the calves or thighs Tracing: baseline 140.  No decel Occ ctx ED Course  IMP: LAP  Previous C/S  IUP @ 30 3/7 weeks Class B DM  Chronic HTN on labetalol due for med Decreased FM P) OB sonogram.  BPP. PIH labs. U/a, ucx. Oral labetalol now MDM   Serita Kyle, MD 8:09 PM 03/23/2021  Addendum CBC Latest Ref Rng & Units 03/23/2021 09/15/2011  WBC 4.0 - 10.5 K/uL 11.7(H) 12.3(H)  Hemoglobin 12.0 - 15.0 g/dL 10.9(L) 12.5  Hematocrit 36.0 - 46.0 % 31.8(L) 35.1(L)  Platelets 150 - 400 K/uL 334 380    CMP Latest Ref Rng & Units 03/23/2021 06/22/2020 05/28/2020  Glucose 70 - 99 mg/dL 85 91 89  BUN 6 - 20 mg/dL 5(L) 7 7  Creatinine 1.69 - 1.00 mg/dL 6.78 9.38(B) 0.17(P)  Sodium 135 - 145 mmol/L 135 137 138  Potassium 3.5 - 5.1 mmol/L 3.7 3.9 4.2  Chloride 98 - 111 mmol/L 102 98 100  CO2 22 - 32 mmol/L 25 26 23   Calcium 8.9 - 10.3 mg/dL 9.5 9.5 9.3  Total Protein 6.5 - 8.1 g/dL 6.9 - -  Total Bilirubin 0.3 - 1.2 mg/dL 0.5 - -  Alkaline Phos 38 - 126 U/L 141(H) - -  AST 15 - 41 U/L 16 - -  ALT 0 - 44 U/L 14 - -    PCR 0.12 Preliminary sono result: BPP 8/8 Pt requested flexeril. Notes improvement of pain with intake of med D/c home Keep scheduled appts

## 2021-03-25 ENCOUNTER — Ambulatory Visit: Payer: 59 | Attending: Obstetrics and Gynecology

## 2021-03-25 ENCOUNTER — Ambulatory Visit: Payer: 59

## 2021-03-25 LAB — CULTURE, OB URINE: Culture: NO GROWTH

## 2021-03-26 DIAGNOSIS — O34219 Maternal care for unspecified type scar from previous cesarean delivery: Secondary | ICD-10-CM | POA: Diagnosis not present

## 2021-03-26 DIAGNOSIS — D582 Other hemoglobinopathies: Secondary | ICD-10-CM | POA: Diagnosis not present

## 2021-03-26 DIAGNOSIS — O0993 Supervision of high risk pregnancy, unspecified, third trimester: Secondary | ICD-10-CM | POA: Diagnosis not present

## 2021-03-26 DIAGNOSIS — Z23 Encounter for immunization: Secondary | ICD-10-CM | POA: Diagnosis not present

## 2021-03-26 DIAGNOSIS — O10913 Unspecified pre-existing hypertension complicating pregnancy, third trimester: Secondary | ICD-10-CM | POA: Diagnosis not present

## 2021-03-26 DIAGNOSIS — O24319 Unspecified pre-existing diabetes mellitus in pregnancy, unspecified trimester: Secondary | ICD-10-CM | POA: Diagnosis not present

## 2021-03-28 ENCOUNTER — Other Ambulatory Visit (HOSPITAL_COMMUNITY): Payer: Self-pay

## 2021-04-01 ENCOUNTER — Other Ambulatory Visit (HOSPITAL_COMMUNITY): Payer: Self-pay

## 2021-04-08 ENCOUNTER — Ambulatory Visit: Payer: 59 | Admitting: *Deleted

## 2021-04-08 ENCOUNTER — Ambulatory Visit: Payer: 59 | Attending: Obstetrics and Gynecology

## 2021-04-08 ENCOUNTER — Encounter: Payer: Self-pay | Admitting: *Deleted

## 2021-04-08 ENCOUNTER — Ambulatory Visit (HOSPITAL_BASED_OUTPATIENT_CLINIC_OR_DEPARTMENT_OTHER): Payer: 59 | Admitting: Obstetrics

## 2021-04-08 ENCOUNTER — Other Ambulatory Visit: Payer: Self-pay | Admitting: *Deleted

## 2021-04-08 ENCOUNTER — Other Ambulatory Visit: Payer: Self-pay

## 2021-04-08 VITALS — BP 140/71 | HR 96

## 2021-04-08 DIAGNOSIS — Q250 Patent ductus arteriosus: Secondary | ICD-10-CM

## 2021-04-08 DIAGNOSIS — E669 Obesity, unspecified: Secondary | ICD-10-CM | POA: Diagnosis not present

## 2021-04-08 DIAGNOSIS — O10919 Unspecified pre-existing hypertension complicating pregnancy, unspecified trimester: Secondary | ICD-10-CM | POA: Insufficient documentation

## 2021-04-08 DIAGNOSIS — O24113 Pre-existing diabetes mellitus, type 2, in pregnancy, third trimester: Secondary | ICD-10-CM

## 2021-04-08 DIAGNOSIS — O34219 Maternal care for unspecified type scar from previous cesarean delivery: Secondary | ICD-10-CM

## 2021-04-08 DIAGNOSIS — Z3A32 32 weeks gestation of pregnancy: Secondary | ICD-10-CM | POA: Diagnosis not present

## 2021-04-08 DIAGNOSIS — O99413 Diseases of the circulatory system complicating pregnancy, third trimester: Secondary | ICD-10-CM

## 2021-04-08 DIAGNOSIS — Z862 Personal history of diseases of the blood and blood-forming organs and certain disorders involving the immune mechanism: Secondary | ICD-10-CM

## 2021-04-08 DIAGNOSIS — O24414 Gestational diabetes mellitus in pregnancy, insulin controlled: Secondary | ICD-10-CM | POA: Diagnosis not present

## 2021-04-08 DIAGNOSIS — E119 Type 2 diabetes mellitus without complications: Secondary | ICD-10-CM | POA: Diagnosis not present

## 2021-04-08 DIAGNOSIS — O10013 Pre-existing essential hypertension complicating pregnancy, third trimester: Secondary | ICD-10-CM | POA: Diagnosis not present

## 2021-04-08 DIAGNOSIS — O99213 Obesity complicating pregnancy, third trimester: Secondary | ICD-10-CM | POA: Diagnosis not present

## 2021-04-09 ENCOUNTER — Telehealth: Payer: Self-pay

## 2021-04-09 NOTE — Telephone Encounter (Signed)
Called patient back to let her know the ultrasound appointment time is 1pm on 04/29/21.  Left message for patient to call office back if this time will not work for her.

## 2021-04-09 NOTE — Progress Notes (Signed)
MFM Note  Nicole Stuart was seen for a follow up growth scan and biophysical profile due to pregestational diabetes treated with insulin and metformin and chronic hypertension treated with labetalol.  The patient reports that she is very concerned that she cannot keep her blood sugars under good control.  She reports that some of her fasting fingerstick values have been as high as 110 and her 2-hour postprandial fingersticks have been as high as 240.  She reports that her current insulin regimen includes Tresiba 18 units daily and Humalog 10 to 12 units before each meal.  Her endocrinologist is helping with the management of her insulin regimen.  She also reports that her most recent hemoglobin A1c was 11.8%, although I could not find any evidence of this in Epic.  She was informed that the fetal growth and amniotic fluid level appears appropriate for her gestational age.    A biophysical profile performed today was 8 out of 8.  The implications and management of diabetes in pregnancy was discussed in detail with the patient. She was advised that our goals for her fingerstick values are fasting values of 90-95 or less and two-hour postprandials of 120 or less.  Should her blood glucose be above these values, her insulin regimen may have to be adjusted to help her achieve better glycemic control. The patient was advised that getting her fingerstick values as close to these goals as possible would provide her with the most optimal obstetrical outcome.  The patient was advised that should her glycemic control continue to be poor, inpatient management for glycemic control may be considered.  The patient stated that inpatient management is not an option as she has a child at home she has to take care of.    She should continue working with her endocrinologist to try to get her blood glucose levels under better control.  The increased risk of fetal macrosomia and polyhydramnios due to uncontrolled diabetes was  discussed.  Due to uncontrolled gestational diabetes, twice-weekly fetal testing should be started.  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  The patient was advised that should her glycemic control remain poor, delivery may be considered at around 37 weeks.  Another biophysical profile was scheduled in 1 week.  A total of 20 minutes was spent counseling and coordinating the care for this patient.  Greater than 50% of the time was spent in direct face-to-face contact.  Recommendations:  Continue to make adjustments to her insulin regimen Start twice-weekly fetal testing and continue until delivery Delivery at 37 weeks

## 2021-04-10 DIAGNOSIS — O34219 Maternal care for unspecified type scar from previous cesarean delivery: Secondary | ICD-10-CM | POA: Diagnosis not present

## 2021-04-10 DIAGNOSIS — O10913 Unspecified pre-existing hypertension complicating pregnancy, third trimester: Secondary | ICD-10-CM | POA: Diagnosis not present

## 2021-04-10 DIAGNOSIS — O0993 Supervision of high risk pregnancy, unspecified, third trimester: Secondary | ICD-10-CM | POA: Diagnosis not present

## 2021-04-10 DIAGNOSIS — O24313 Unspecified pre-existing diabetes mellitus in pregnancy, third trimester: Secondary | ICD-10-CM | POA: Diagnosis not present

## 2021-04-10 DIAGNOSIS — D582 Other hemoglobinopathies: Secondary | ICD-10-CM | POA: Diagnosis not present

## 2021-04-10 DIAGNOSIS — O99413 Diseases of the circulatory system complicating pregnancy, third trimester: Secondary | ICD-10-CM | POA: Diagnosis not present

## 2021-04-11 ENCOUNTER — Other Ambulatory Visit: Payer: Self-pay

## 2021-04-11 ENCOUNTER — Ambulatory Visit: Payer: 59 | Admitting: *Deleted

## 2021-04-11 ENCOUNTER — Encounter: Payer: Self-pay | Admitting: *Deleted

## 2021-04-11 ENCOUNTER — Ambulatory Visit: Payer: 59 | Attending: Obstetrics | Admitting: *Deleted

## 2021-04-11 VITALS — BP 131/67 | HR 91

## 2021-04-11 DIAGNOSIS — O99213 Obesity complicating pregnancy, third trimester: Secondary | ICD-10-CM | POA: Diagnosis not present

## 2021-04-11 DIAGNOSIS — Z794 Long term (current) use of insulin: Secondary | ICD-10-CM

## 2021-04-11 DIAGNOSIS — E669 Obesity, unspecified: Secondary | ICD-10-CM

## 2021-04-11 DIAGNOSIS — O24113 Pre-existing diabetes mellitus, type 2, in pregnancy, third trimester: Secondary | ICD-10-CM

## 2021-04-11 DIAGNOSIS — E119 Type 2 diabetes mellitus without complications: Secondary | ICD-10-CM

## 2021-04-11 DIAGNOSIS — Z3A33 33 weeks gestation of pregnancy: Secondary | ICD-10-CM | POA: Diagnosis not present

## 2021-04-11 DIAGNOSIS — Z6837 Body mass index (BMI) 37.0-37.9, adult: Secondary | ICD-10-CM

## 2021-04-11 DIAGNOSIS — I10 Essential (primary) hypertension: Secondary | ICD-10-CM

## 2021-04-11 NOTE — Procedures (Signed)
Nicole Stuart [birth year] [redacted]w[redacted]d  Fetus A Non-Stress Test Interpretation for 04/11/21  Indication:  Obesity, T2DM-insulin  Fetal Heart Rate A Mode: External Baseline Rate (A): 140 bpm Variability: Minimal, Moderate Accelerations: 15 x 15 Decelerations: Variable, Prolonged Multiple birth?: No  Uterine Activity Mode: Palpation, Toco Contraction Frequency (min): occ Contraction Duration (sec): 40-50 Contraction Quality: Mild Resting Tone Palpated: Relaxed Resting Time: Adequate  Interpretation (Fetal Testing) Nonstress Test Interpretation: Reactive Overall Impression: Reassuring for gestational age Comments: Dr. Parke Poisson reviewed tracing

## 2021-04-15 ENCOUNTER — Ambulatory Visit: Payer: 59 | Admitting: *Deleted

## 2021-04-15 ENCOUNTER — Ambulatory Visit: Payer: 59

## 2021-04-15 ENCOUNTER — Other Ambulatory Visit: Payer: Self-pay

## 2021-04-15 ENCOUNTER — Encounter: Payer: Self-pay | Admitting: *Deleted

## 2021-04-15 ENCOUNTER — Ambulatory Visit: Payer: 59 | Attending: Obstetrics and Gynecology

## 2021-04-15 VITALS — BP 130/71 | HR 90

## 2021-04-15 DIAGNOSIS — O10919 Unspecified pre-existing hypertension complicating pregnancy, unspecified trimester: Secondary | ICD-10-CM | POA: Diagnosis not present

## 2021-04-15 DIAGNOSIS — O24113 Pre-existing diabetes mellitus, type 2, in pregnancy, third trimester: Secondary | ICD-10-CM | POA: Insufficient documentation

## 2021-04-15 DIAGNOSIS — O99413 Diseases of the circulatory system complicating pregnancy, third trimester: Secondary | ICD-10-CM | POA: Diagnosis not present

## 2021-04-15 DIAGNOSIS — O99213 Obesity complicating pregnancy, third trimester: Secondary | ICD-10-CM

## 2021-04-15 DIAGNOSIS — O10013 Pre-existing essential hypertension complicating pregnancy, third trimester: Secondary | ICD-10-CM

## 2021-04-15 DIAGNOSIS — E669 Obesity, unspecified: Secondary | ICD-10-CM

## 2021-04-15 DIAGNOSIS — Z3A33 33 weeks gestation of pregnancy: Secondary | ICD-10-CM

## 2021-04-15 DIAGNOSIS — O34219 Maternal care for unspecified type scar from previous cesarean delivery: Secondary | ICD-10-CM

## 2021-04-18 ENCOUNTER — Other Ambulatory Visit: Payer: Self-pay

## 2021-04-18 ENCOUNTER — Ambulatory Visit: Payer: 59 | Admitting: *Deleted

## 2021-04-18 ENCOUNTER — Ambulatory Visit: Payer: 59 | Attending: Obstetrics and Gynecology | Admitting: *Deleted

## 2021-04-18 ENCOUNTER — Encounter: Payer: Self-pay | Admitting: *Deleted

## 2021-04-18 VITALS — BP 128/65 | HR 92

## 2021-04-18 DIAGNOSIS — O10913 Unspecified pre-existing hypertension complicating pregnancy, third trimester: Secondary | ICD-10-CM | POA: Insufficient documentation

## 2021-04-18 DIAGNOSIS — Z3A34 34 weeks gestation of pregnancy: Secondary | ICD-10-CM | POA: Insufficient documentation

## 2021-04-18 DIAGNOSIS — O24113 Pre-existing diabetes mellitus, type 2, in pregnancy, third trimester: Secondary | ICD-10-CM | POA: Diagnosis not present

## 2021-04-18 NOTE — Procedures (Signed)
Nicole Stuart 30-Apr-1991 [redacted]w[redacted]d  Fetus A Non-Stress Test Interpretation for 04/18/21  Indication: Chronic Hypertenstion and Diabetes  Fetal Heart Rate A Mode: External Baseline Rate (A): 145 bpm Variability: Moderate Accelerations: 15 x 15 Decelerations: None Multiple birth?: No  Uterine Activity Mode: Palpation, Toco Contraction Frequency (min): UI Contraction Quality: Mild Resting Tone Palpated: Relaxed Resting Time: Adequate  Interpretation (Fetal Testing) Nonstress Test Interpretation: Reactive Comments: Dr. Grace Bushy reviewed tracing.

## 2021-04-23 ENCOUNTER — Ambulatory Visit: Payer: 59 | Admitting: *Deleted

## 2021-04-23 ENCOUNTER — Encounter: Payer: Self-pay | Admitting: *Deleted

## 2021-04-23 ENCOUNTER — Ambulatory Visit: Payer: 59 | Attending: Obstetrics

## 2021-04-23 ENCOUNTER — Other Ambulatory Visit: Payer: Self-pay

## 2021-04-23 VITALS — BP 132/71 | HR 92

## 2021-04-23 DIAGNOSIS — O24113 Pre-existing diabetes mellitus, type 2, in pregnancy, third trimester: Secondary | ICD-10-CM | POA: Diagnosis not present

## 2021-04-23 DIAGNOSIS — E669 Obesity, unspecified: Secondary | ICD-10-CM

## 2021-04-23 DIAGNOSIS — E119 Type 2 diabetes mellitus without complications: Secondary | ICD-10-CM | POA: Diagnosis not present

## 2021-04-23 DIAGNOSIS — O99213 Obesity complicating pregnancy, third trimester: Secondary | ICD-10-CM

## 2021-04-23 DIAGNOSIS — O99413 Diseases of the circulatory system complicating pregnancy, third trimester: Secondary | ICD-10-CM | POA: Diagnosis not present

## 2021-04-23 DIAGNOSIS — Z3A34 34 weeks gestation of pregnancy: Secondary | ICD-10-CM | POA: Diagnosis not present

## 2021-04-23 DIAGNOSIS — O10013 Pre-existing essential hypertension complicating pregnancy, third trimester: Secondary | ICD-10-CM

## 2021-04-24 ENCOUNTER — Other Ambulatory Visit (HOSPITAL_COMMUNITY): Payer: Self-pay

## 2021-04-24 MED ORDER — INSULIN GLARGINE-YFGN 100 UNIT/ML ~~LOC~~ SOPN
PEN_INJECTOR | SUBCUTANEOUS | 1 refills | Status: DC
  Filled 2021-04-24: qty 12, 30d supply, fill #0

## 2021-04-25 ENCOUNTER — Ambulatory Visit: Payer: 59 | Admitting: *Deleted

## 2021-04-25 ENCOUNTER — Ambulatory Visit: Payer: 59 | Attending: Obstetrics | Admitting: *Deleted

## 2021-04-25 ENCOUNTER — Other Ambulatory Visit (HOSPITAL_COMMUNITY): Payer: Self-pay

## 2021-04-25 ENCOUNTER — Other Ambulatory Visit: Payer: Self-pay

## 2021-04-25 VITALS — BP 129/72 | HR 98

## 2021-04-25 DIAGNOSIS — O34219 Maternal care for unspecified type scar from previous cesarean delivery: Secondary | ICD-10-CM | POA: Diagnosis not present

## 2021-04-25 DIAGNOSIS — O10913 Unspecified pre-existing hypertension complicating pregnancy, third trimester: Secondary | ICD-10-CM | POA: Diagnosis not present

## 2021-04-25 DIAGNOSIS — Z3685 Encounter for antenatal screening for Streptococcus B: Secondary | ICD-10-CM | POA: Diagnosis not present

## 2021-04-25 DIAGNOSIS — O24319 Unspecified pre-existing diabetes mellitus in pregnancy, unspecified trimester: Secondary | ICD-10-CM | POA: Diagnosis not present

## 2021-04-25 DIAGNOSIS — O24313 Unspecified pre-existing diabetes mellitus in pregnancy, third trimester: Secondary | ICD-10-CM | POA: Diagnosis not present

## 2021-04-25 DIAGNOSIS — D582 Other hemoglobinopathies: Secondary | ICD-10-CM | POA: Diagnosis not present

## 2021-04-25 DIAGNOSIS — O24113 Pre-existing diabetes mellitus, type 2, in pregnancy, third trimester: Secondary | ICD-10-CM | POA: Diagnosis not present

## 2021-04-25 DIAGNOSIS — O368130 Decreased fetal movements, third trimester, not applicable or unspecified: Secondary | ICD-10-CM | POA: Diagnosis not present

## 2021-04-25 DIAGNOSIS — O0993 Supervision of high risk pregnancy, unspecified, third trimester: Secondary | ICD-10-CM | POA: Diagnosis not present

## 2021-04-25 DIAGNOSIS — Z3A35 35 weeks gestation of pregnancy: Secondary | ICD-10-CM | POA: Insufficient documentation

## 2021-04-25 DIAGNOSIS — Z6838 Body mass index (BMI) 38.0-38.9, adult: Secondary | ICD-10-CM

## 2021-04-25 DIAGNOSIS — O10013 Pre-existing essential hypertension complicating pregnancy, third trimester: Secondary | ICD-10-CM | POA: Diagnosis not present

## 2021-04-25 DIAGNOSIS — D573 Sickle-cell trait: Secondary | ICD-10-CM | POA: Diagnosis not present

## 2021-04-25 NOTE — Procedures (Signed)
Nicole Stuart Aug 19, 1990 [redacted]w[redacted]d  Fetus A Non-Stress Test Interpretation for 04/25/21  Indication: Chronic Hypertenstion, Diabetes  Fetal Heart Rate A Mode: External Baseline Rate (A): 140 bpm Variability: Moderate Accelerations: 15 x 15 Decelerations: Variable Multiple birth?: No  Uterine Activity Mode: Palpation, Toco Contraction Frequency (min): occ Contraction Duration (sec): 60-70 Contraction Quality: Mild Resting Tone Palpated: Relaxed Resting Time: Adequate  Interpretation (Fetal Testing) Nonstress Test Interpretation: Reactive Overall Impression: Reassuring for gestational age Comments: Dr. Grace Bushy reviewed tracing

## 2021-04-29 ENCOUNTER — Ambulatory Visit: Payer: 59

## 2021-04-29 ENCOUNTER — Other Ambulatory Visit (HOSPITAL_COMMUNITY): Payer: Self-pay

## 2021-04-29 ENCOUNTER — Encounter: Payer: Self-pay | Admitting: *Deleted

## 2021-04-29 ENCOUNTER — Ambulatory Visit (HOSPITAL_BASED_OUTPATIENT_CLINIC_OR_DEPARTMENT_OTHER): Payer: 59

## 2021-04-29 ENCOUNTER — Ambulatory Visit: Payer: 59 | Attending: Obstetrics | Admitting: *Deleted

## 2021-04-29 ENCOUNTER — Other Ambulatory Visit: Payer: Self-pay

## 2021-04-29 VITALS — BP 138/76 | HR 91

## 2021-04-29 DIAGNOSIS — O99413 Diseases of the circulatory system complicating pregnancy, third trimester: Secondary | ICD-10-CM | POA: Diagnosis not present

## 2021-04-29 DIAGNOSIS — O99213 Obesity complicating pregnancy, third trimester: Secondary | ICD-10-CM | POA: Insufficient documentation

## 2021-04-29 DIAGNOSIS — Z3A35 35 weeks gestation of pregnancy: Secondary | ICD-10-CM | POA: Diagnosis not present

## 2021-04-29 DIAGNOSIS — O10013 Pre-existing essential hypertension complicating pregnancy, third trimester: Secondary | ICD-10-CM

## 2021-04-29 DIAGNOSIS — E119 Type 2 diabetes mellitus without complications: Secondary | ICD-10-CM

## 2021-04-29 DIAGNOSIS — O24113 Pre-existing diabetes mellitus, type 2, in pregnancy, third trimester: Secondary | ICD-10-CM | POA: Insufficient documentation

## 2021-04-29 DIAGNOSIS — E669 Obesity, unspecified: Secondary | ICD-10-CM

## 2021-04-29 DIAGNOSIS — O10913 Unspecified pre-existing hypertension complicating pregnancy, third trimester: Secondary | ICD-10-CM | POA: Insufficient documentation

## 2021-04-29 DIAGNOSIS — O34211 Maternal care for low transverse scar from previous cesarean delivery: Secondary | ICD-10-CM | POA: Diagnosis not present

## 2021-04-29 DIAGNOSIS — D573 Sickle-cell trait: Secondary | ICD-10-CM | POA: Diagnosis not present

## 2021-04-30 DIAGNOSIS — O10913 Unspecified pre-existing hypertension complicating pregnancy, third trimester: Secondary | ICD-10-CM | POA: Diagnosis not present

## 2021-04-30 DIAGNOSIS — O0993 Supervision of high risk pregnancy, unspecified, third trimester: Secondary | ICD-10-CM | POA: Diagnosis not present

## 2021-04-30 DIAGNOSIS — O99413 Diseases of the circulatory system complicating pregnancy, third trimester: Secondary | ICD-10-CM | POA: Diagnosis not present

## 2021-04-30 DIAGNOSIS — D582 Other hemoglobinopathies: Secondary | ICD-10-CM | POA: Diagnosis not present

## 2021-04-30 DIAGNOSIS — O24313 Unspecified pre-existing diabetes mellitus in pregnancy, third trimester: Secondary | ICD-10-CM | POA: Diagnosis not present

## 2021-04-30 DIAGNOSIS — O34219 Maternal care for unspecified type scar from previous cesarean delivery: Secondary | ICD-10-CM | POA: Diagnosis not present

## 2021-05-02 ENCOUNTER — Encounter (HOSPITAL_COMMUNITY): Payer: Self-pay

## 2021-05-02 ENCOUNTER — Ambulatory Visit: Payer: 59 | Attending: Obstetrics

## 2021-05-02 ENCOUNTER — Other Ambulatory Visit (HOSPITAL_COMMUNITY): Payer: Self-pay

## 2021-05-02 ENCOUNTER — Ambulatory Visit: Payer: 59

## 2021-05-02 NOTE — Patient Instructions (Signed)
Nicole Stuart  05/02/2021   Your procedure is scheduled on:  05/08/2021  Arrive at 1030 at Entrance C on [address] at [facility]  and [employer]. You are invited to use the FREE valet parking or use the Visitor's parking deck.  Pick up the phone at the desk and dial [phone].  Call this number if you have problems the morning of surgery: [phone]  Remember:   Do not eat food:(After Midnight) Desps de medianoche.  Do not drink clear liquids: (After Midnight) Desps de medianoche.  Take these medicines the morning of surgery with A SIP OF WATER:  Half the bedtime insulin as prescribed by your doctor.  Take your labetalol as prescribed.  No other medications the day of surgery.   Do not wear jewelry, make-up or nail polish.  Do not wear lotions, powders, or perfumes. Do not wear deodorant.  Do not shave 48 hours prior to surgery.  Do not bring valuables to the hospital.  [facility] is not   responsible for any belongings or valuables brought to the hospital.  Contacts, dentures or bridgework may not be worn into surgery.  Leave suitcase in the car. After surgery it may be brought to your room.  For patients admitted to the hospital, checkout time is 11:00 AM the day of              discharge.      Please read over the following fact sheets that you were given:     Preparing for Surgery

## 2021-05-06 ENCOUNTER — Other Ambulatory Visit: Payer: Self-pay | Admitting: Obstetrics and Gynecology

## 2021-05-06 ENCOUNTER — Other Ambulatory Visit: Payer: Self-pay

## 2021-05-06 ENCOUNTER — Encounter (HOSPITAL_COMMUNITY)
Admission: RE | Admit: 2021-05-06 | Discharge: 2021-05-06 | Disposition: A | Discharge status: Home / Self Care | Payer: 59 | Source: Ambulatory Visit | Attending: Obstetrics and Gynecology | Admitting: Obstetrics and Gynecology

## 2021-05-06 ENCOUNTER — Ambulatory Visit (HOSPITAL_BASED_OUTPATIENT_CLINIC_OR_DEPARTMENT_OTHER): Payer: 59

## 2021-05-06 ENCOUNTER — Encounter: Payer: Self-pay | Admitting: *Deleted

## 2021-05-06 ENCOUNTER — Ambulatory Visit: Payer: 59 | Admitting: *Deleted

## 2021-05-06 VITALS — BP 135/77 | HR 79

## 2021-05-06 DIAGNOSIS — O326XX0 Maternal care for compound presentation, not applicable or unspecified: Secondary | ICD-10-CM | POA: Diagnosis not present

## 2021-05-06 DIAGNOSIS — O99213 Obesity complicating pregnancy, third trimester: Secondary | ICD-10-CM

## 2021-05-06 DIAGNOSIS — O24313 Unspecified pre-existing diabetes mellitus in pregnancy, third trimester: Secondary | ICD-10-CM | POA: Diagnosis not present

## 2021-05-06 DIAGNOSIS — O24113 Pre-existing diabetes mellitus, type 2, in pregnancy, third trimester: Secondary | ICD-10-CM | POA: Insufficient documentation

## 2021-05-06 DIAGNOSIS — Z3A36 36 weeks gestation of pregnancy: Secondary | ICD-10-CM | POA: Diagnosis not present

## 2021-05-06 DIAGNOSIS — E669 Obesity, unspecified: Secondary | ICD-10-CM | POA: Insufficient documentation

## 2021-05-06 DIAGNOSIS — O34211 Maternal care for low transverse scar from previous cesarean delivery: Secondary | ICD-10-CM | POA: Insufficient documentation

## 2021-05-06 DIAGNOSIS — Z20822 Contact with and (suspected) exposure to covid-19: Secondary | ICD-10-CM | POA: Insufficient documentation

## 2021-05-06 DIAGNOSIS — O2412 Pre-existing diabetes mellitus, type 2, in childbirth: Secondary | ICD-10-CM | POA: Diagnosis not present

## 2021-05-06 DIAGNOSIS — O9081 Anemia of the puerperium: Secondary | ICD-10-CM | POA: Diagnosis not present

## 2021-05-06 DIAGNOSIS — Z01812 Encounter for preprocedural laboratory examination: Secondary | ICD-10-CM | POA: Insufficient documentation

## 2021-05-06 DIAGNOSIS — E119 Type 2 diabetes mellitus without complications: Secondary | ICD-10-CM | POA: Diagnosis not present

## 2021-05-06 DIAGNOSIS — E1165 Type 2 diabetes mellitus with hyperglycemia: Secondary | ICD-10-CM | POA: Diagnosis not present

## 2021-05-06 DIAGNOSIS — O10913 Unspecified pre-existing hypertension complicating pregnancy, third trimester: Secondary | ICD-10-CM | POA: Diagnosis not present

## 2021-05-06 DIAGNOSIS — O34219 Maternal care for unspecified type scar from previous cesarean delivery: Secondary | ICD-10-CM

## 2021-05-06 DIAGNOSIS — Z362 Encounter for other antenatal screening follow-up: Secondary | ICD-10-CM

## 2021-05-06 DIAGNOSIS — D62 Acute posthemorrhagic anemia: Secondary | ICD-10-CM | POA: Diagnosis not present

## 2021-05-06 DIAGNOSIS — Z3A37 37 weeks gestation of pregnancy: Secondary | ICD-10-CM | POA: Diagnosis not present

## 2021-05-06 DIAGNOSIS — O1002 Pre-existing essential hypertension complicating childbirth: Secondary | ICD-10-CM | POA: Diagnosis not present

## 2021-05-06 DIAGNOSIS — E118 Type 2 diabetes mellitus with unspecified complications: Secondary | ICD-10-CM | POA: Insufficient documentation

## 2021-05-06 DIAGNOSIS — Z794 Long term (current) use of insulin: Secondary | ICD-10-CM | POA: Diagnosis not present

## 2021-05-06 DIAGNOSIS — O1092 Unspecified pre-existing hypertension complicating childbirth: Secondary | ICD-10-CM | POA: Diagnosis not present

## 2021-05-06 HISTORY — DX: Gestational diabetes mellitus in pregnancy, unspecified control: O24.419

## 2021-05-06 LAB — CBC
HCT: 34 % — ABNORMAL LOW (ref 36.0–46.0)
Hemoglobin: 11.3 g/dL — ABNORMAL LOW (ref 12.0–15.0)
MCH: 24.4 pg — ABNORMAL LOW (ref 26.0–34.0)
MCHC: 33.2 g/dL (ref 30.0–36.0)
MCV: 73.4 fL — ABNORMAL LOW (ref 80.0–100.0)
Platelets: 340 10*3/uL (ref 150–400)
RBC: 4.63 MIL/uL (ref 3.87–5.11)
RDW: 16.2 % — ABNORMAL HIGH (ref 11.5–15.5)
WBC: 7.9 10*3/uL (ref 4.0–10.5)
nRBC: 0 % (ref 0.0–0.2)

## 2021-05-06 LAB — TYPE AND SCREEN
ABO/RH(D): O POS
Antibody Screen: NEGATIVE

## 2021-05-07 LAB — RPR: RPR Ser Ql: NONREACTIVE

## 2021-05-07 LAB — SARS CORONAVIRUS 2 (TAT 6-24 HRS): SARS Coronavirus 2: NEGATIVE

## 2021-05-08 ENCOUNTER — Inpatient Hospital Stay (HOSPITAL_COMMUNITY): Payer: 59 | Admitting: Certified Registered Nurse Anesthetist

## 2021-05-08 ENCOUNTER — Encounter (HOSPITAL_COMMUNITY): Payer: Self-pay | Admitting: Obstetrics and Gynecology

## 2021-05-08 ENCOUNTER — Encounter (HOSPITAL_COMMUNITY)
Admission: RE | Disposition: A | Discharge status: Home / Self Care | Payer: Self-pay | Source: Home / Self Care | Attending: Obstetrics and Gynecology

## 2021-05-08 ENCOUNTER — Inpatient Hospital Stay (HOSPITAL_COMMUNITY)
Admission: RE | Admit: 2021-05-08 | Discharge: 2021-05-10 | DRG: 787 | Disposition: A | Discharge status: Home / Self Care | Payer: 59 | Attending: Obstetrics and Gynecology | Admitting: Obstetrics and Gynecology

## 2021-05-08 ENCOUNTER — Other Ambulatory Visit: Payer: Self-pay

## 2021-05-08 DIAGNOSIS — E1169 Type 2 diabetes mellitus with other specified complication: Secondary | ICD-10-CM

## 2021-05-08 DIAGNOSIS — O24319 Unspecified pre-existing diabetes mellitus in pregnancy, unspecified trimester: Secondary | ICD-10-CM | POA: Diagnosis present

## 2021-05-08 DIAGNOSIS — D62 Acute posthemorrhagic anemia: Secondary | ICD-10-CM | POA: Diagnosis not present

## 2021-05-08 DIAGNOSIS — E1165 Type 2 diabetes mellitus with hyperglycemia: Secondary | ICD-10-CM | POA: Diagnosis present

## 2021-05-08 DIAGNOSIS — O1002 Pre-existing essential hypertension complicating childbirth: Secondary | ICD-10-CM | POA: Diagnosis present

## 2021-05-08 DIAGNOSIS — Z794 Long term (current) use of insulin: Secondary | ICD-10-CM | POA: Diagnosis not present

## 2021-05-08 DIAGNOSIS — O9081 Anemia of the puerperium: Secondary | ICD-10-CM | POA: Diagnosis not present

## 2021-05-08 DIAGNOSIS — O34211 Maternal care for low transverse scar from previous cesarean delivery: Secondary | ICD-10-CM | POA: Diagnosis present

## 2021-05-08 DIAGNOSIS — O326XX0 Maternal care for compound presentation, not applicable or unspecified: Secondary | ICD-10-CM | POA: Diagnosis present

## 2021-05-08 DIAGNOSIS — Z3A37 37 weeks gestation of pregnancy: Secondary | ICD-10-CM

## 2021-05-08 DIAGNOSIS — O2412 Pre-existing diabetes mellitus, type 2, in childbirth: Secondary | ICD-10-CM | POA: Diagnosis present

## 2021-05-08 LAB — GLUCOSE, CAPILLARY
Glucose-Capillary: 85 mg/dL (ref 70–99)
Glucose-Capillary: 104 mg/dL — ABNORMAL HIGH (ref 70–99)

## 2021-05-08 SURGERY — Surgical Case
Anesthesia: Spinal

## 2021-05-08 MED ORDER — DEXAMETHASONE SODIUM PHOSPHATE 4 MG/ML IJ SOLN
INTRAMUSCULAR | Status: DC | PRN
  Administered 2021-05-08: 10 mg via INTRAVENOUS

## 2021-05-08 MED ORDER — FENTANYL CITRATE (PF) 100 MCG/2ML IJ SOLN
INTRAMUSCULAR | Status: AC
  Filled 2021-05-08: qty 2

## 2021-05-08 MED ORDER — OXYTOCIN-SODIUM CHLORIDE 30-0.9 UT/500ML-% IV SOLN
INTRAVENOUS | Status: AC
  Filled 2021-05-08: qty 1000

## 2021-05-08 MED ORDER — OXYCODONE HCL 5 MG PO TABS: 5.0000 mg | ORAL_TABLET | Freq: Once | ORAL | Status: DC | PRN

## 2021-05-08 MED ORDER — ONDANSETRON HCL 4 MG/2ML IJ SOLN
INTRAMUSCULAR | Status: AC
  Filled 2021-05-08: qty 2

## 2021-05-08 MED ORDER — SCOPOLAMINE 1 MG/3DAYS TD PT72
MEDICATED_PATCH | TRANSDERMAL | Status: AC
  Filled 2021-05-08: qty 1

## 2021-05-08 MED ORDER — SIMETHICONE 80 MG PO CHEW: 80.0000 mg | CHEWABLE_TABLET | ORAL | Status: DC | PRN

## 2021-05-08 MED ORDER — KETOROLAC TROMETHAMINE 30 MG/ML IJ SOLN
INTRAMUSCULAR | Status: AC
  Filled 2021-05-08: qty 1

## 2021-05-08 MED ORDER — AMISULPRIDE (ANTIEMETIC) 5 MG/2ML IV SOLN
10.0000 mg | Freq: Once | INTRAVENOUS | Status: DC | PRN
Start: 2021-05-08 — End: 2021-05-08

## 2021-05-08 MED ORDER — CEFAZOLIN SODIUM-DEXTROSE 2-4 GM/100ML-% IV SOLN
2.0000 g | INTRAVENOUS | Status: AC
  Administered 2021-05-08: 2 g via INTRAVENOUS

## 2021-05-08 MED ORDER — HYDROMORPHONE HCL 1 MG/ML IJ SOLN
0.5000 mg | INTRAMUSCULAR | Status: AC
  Administered 2021-05-08 (×2): 0.5 mg via INTRAVENOUS

## 2021-05-08 MED ORDER — ACETAMINOPHEN 10 MG/ML IV SOLN
INTRAVENOUS | Status: AC
  Filled 2021-05-08: qty 100

## 2021-05-08 MED ORDER — DIPHENHYDRAMINE HCL 50 MG/ML IJ SOLN: 12.5000 mg | Freq: Four times a day (QID) | INTRAMUSCULAR | Status: DC | PRN

## 2021-05-08 MED ORDER — SIMETHICONE 80 MG PO CHEW
80.0000 mg | CHEWABLE_TABLET | Freq: Three times a day (TID) | ORAL | Status: DC
  Administered 2021-05-08 – 2021-05-10 (×5): 80 mg via ORAL
  Filled 2021-05-08 (×5): qty 1

## 2021-05-08 MED ORDER — KETOROLAC TROMETHAMINE 30 MG/ML IJ SOLN
30.0000 mg | Freq: Once | INTRAMUSCULAR | Status: AC
  Administered 2021-05-08: 30 mg via INTRAVENOUS

## 2021-05-08 MED ORDER — MORPHINE SULFATE (PF) 0.5 MG/ML IJ SOLN
INTRAMUSCULAR | Status: DC | PRN
  Administered 2021-05-08: 150 ug via INTRATHECAL

## 2021-05-08 MED ORDER — INSULIN LISPRO (1 UNIT DIAL) 100 UNIT/ML (KWIKPEN): 25.0000 [IU] | PEN_INJECTOR | Freq: Three times a day (TID) | SUBCUTANEOUS | Status: DC

## 2021-05-08 MED ORDER — NALBUPHINE HCL 10 MG/ML IJ SOLN: 5.0000 mg | INTRAMUSCULAR | Status: DC | PRN

## 2021-05-08 MED ORDER — NALBUPHINE HCL 10 MG/ML IJ SOLN: 5.0000 mg | Freq: Once | INTRAMUSCULAR | Status: DC | PRN

## 2021-05-08 MED ORDER — NALOXONE HCL 4 MG/10ML IJ SOLN
1.0000 ug/kg/h | INTRAVENOUS | Status: DC | PRN
  Filled 2021-05-08: qty 5

## 2021-05-08 MED ORDER — INSULIN ASPART 100 UNIT/ML IJ SOLN
25.0000 [IU] | Freq: Three times a day (TID) | INTRAMUSCULAR | Status: DC
  Administered 2021-05-08: 10 [IU] via SUBCUTANEOUS
  Administered 2021-05-09 (×2): 25 [IU] via SUBCUTANEOUS

## 2021-05-08 MED ORDER — CEFAZOLIN SODIUM-DEXTROSE 2-4 GM/100ML-% IV SOLN
INTRAVENOUS | Status: AC
  Filled 2021-05-08: qty 100

## 2021-05-08 MED ORDER — COCONUT OIL OIL: 1.0000 "application " | TOPICAL_OIL | Status: DC | PRN

## 2021-05-08 MED ORDER — ONDANSETRON HCL 4 MG/2ML IJ SOLN
INTRAMUSCULAR | Status: DC | PRN
  Administered 2021-05-08: 4 mg via INTRAVENOUS

## 2021-05-08 MED ORDER — MIDAZOLAM HCL 2 MG/2ML IJ SOLN
INTRAMUSCULAR | Status: DC | PRN
  Administered 2021-05-08: 1 mg via INTRAVENOUS

## 2021-05-08 MED ORDER — ACETAMINOPHEN 500 MG PO TABS
1000.0000 mg | ORAL_TABLET | Freq: Four times a day (QID) | ORAL | Status: DC
  Administered 2021-05-08 – 2021-05-10 (×7): 1000 mg via ORAL
  Filled 2021-05-08 (×7): qty 2

## 2021-05-08 MED ORDER — PHENYLEPHRINE HCL-NACL 20-0.9 MG/250ML-% IV SOLN
INTRAVENOUS | Status: DC | PRN
  Administered 2021-05-08: 60 ug/min via INTRAVENOUS

## 2021-05-08 MED ORDER — SOD CITRATE-CITRIC ACID 500-334 MG/5ML PO SOLN
ORAL | Status: AC
  Filled 2021-05-08: qty 30

## 2021-05-08 MED ORDER — PHENYLEPHRINE HCL-NACL 20-0.9 MG/250ML-% IV SOLN
INTRAVENOUS | Status: AC
  Filled 2021-05-08: qty 250

## 2021-05-08 MED ORDER — FENTANYL CITRATE (PF) 100 MCG/2ML IJ SOLN
INTRAMUSCULAR | Status: DC | PRN
  Administered 2021-05-08: 50 ug via INTRAVENOUS
  Administered 2021-05-08: 85 ug via INTRAVENOUS
  Administered 2021-05-08: 50 ug via INTRAVENOUS

## 2021-05-08 MED ORDER — DEXMEDETOMIDINE (PRECEDEX) IN NS 20 MCG/5ML (4 MCG/ML) IV SYRINGE
PREFILLED_SYRINGE | INTRAVENOUS | Status: AC
  Filled 2021-05-08: qty 5

## 2021-05-08 MED ORDER — FENTANYL CITRATE (PF) 100 MCG/2ML IJ SOLN
25.0000 ug | INTRAMUSCULAR | Status: DC | PRN
  Administered 2021-05-08 (×3): 50 ug via INTRAVENOUS

## 2021-05-08 MED ORDER — ACETAMINOPHEN 10 MG/ML IV SOLN: 1000.0000 mg | Freq: Once | INTRAVENOUS | Status: DC | PRN

## 2021-05-08 MED ORDER — SENNOSIDES-DOCUSATE SODIUM 8.6-50 MG PO TABS
2.0000 | ORAL_TABLET | ORAL | Status: DC
  Administered 2021-05-08 – 2021-05-09 (×2): 2 via ORAL
  Filled 2021-05-08 (×3): qty 2

## 2021-05-08 MED ORDER — HYDROMORPHONE HCL 1 MG/ML IJ SOLN
INTRAMUSCULAR | Status: AC
  Filled 2021-05-08: qty 0.5

## 2021-05-08 MED ORDER — OXYCODONE HCL 5 MG/5ML PO SOLN: 5.0000 mg | Freq: Once | ORAL | Status: DC | PRN

## 2021-05-08 MED ORDER — SCOPOLAMINE 1 MG/3DAYS TD PT72
1.0000 | MEDICATED_PATCH | Freq: Once | TRANSDERMAL | Status: DC
  Administered 2021-05-08: 1.5 mg via TRANSDERMAL

## 2021-05-08 MED ORDER — FENTANYL CITRATE (PF) 100 MCG/2ML IJ SOLN
INTRAMUSCULAR | Status: DC | PRN
  Administered 2021-05-08: 15 ug via INTRATHECAL

## 2021-05-08 MED ORDER — ZOLPIDEM TARTRATE 5 MG PO TABS: 5.0000 mg | ORAL_TABLET | Freq: Every evening | ORAL | Status: DC | PRN

## 2021-05-08 MED ORDER — ONDANSETRON HCL 4 MG/2ML IJ SOLN: 4.0000 mg | Freq: Three times a day (TID) | INTRAMUSCULAR | Status: DC | PRN

## 2021-05-08 MED ORDER — PRENATAL MULTIVITAMIN CH
1.0000 | ORAL_TABLET | Freq: Every day | ORAL | Status: DC
  Administered 2021-05-09 – 2021-05-10 (×2): 1 via ORAL
  Filled 2021-05-08 (×2): qty 1

## 2021-05-08 MED ORDER — INSULIN DETEMIR 100 UNIT/ML ~~LOC~~ SOLN
40.0000 [IU] | Freq: Every day | SUBCUTANEOUS | Status: DC
  Filled 2021-05-08: qty 0.4

## 2021-05-08 MED ORDER — PROMETHAZINE HCL 25 MG/ML IJ SOLN: 6.2500 mg | INTRAMUSCULAR | Status: DC | PRN

## 2021-05-08 MED ORDER — LACTATED RINGERS IV SOLN: INTRAVENOUS | Status: DC

## 2021-05-08 MED ORDER — ACETAMINOPHEN 10 MG/ML IV SOLN
INTRAVENOUS | Status: DC | PRN
  Administered 2021-05-08: 1000 mg via INTRAVENOUS

## 2021-05-08 MED ORDER — IBUPROFEN 600 MG PO TABS
600.0000 mg | ORAL_TABLET | Freq: Four times a day (QID) | ORAL | Status: DC
  Administered 2021-05-08 – 2021-05-10 (×7): 600 mg via ORAL
  Filled 2021-05-08 (×7): qty 1

## 2021-05-08 MED ORDER — LACTATED RINGERS IV SOLN
INTRAVENOUS | Status: DC
  Administered 2021-05-08: 125 mL via INTRAVENOUS

## 2021-05-08 MED ORDER — DIPHENHYDRAMINE HCL 25 MG PO CAPS: 25.0000 mg | ORAL_CAPSULE | ORAL | Status: DC | PRN

## 2021-05-08 MED ORDER — DIPHENHYDRAMINE HCL 25 MG PO CAPS: 25.0000 mg | ORAL_CAPSULE | Freq: Four times a day (QID) | ORAL | Status: DC | PRN

## 2021-05-08 MED ORDER — OXYTOCIN-SODIUM CHLORIDE 30-0.9 UT/500ML-% IV SOLN
INTRAVENOUS | Status: DC | PRN
  Administered 2021-05-08 (×2): 30 [IU] via INTRAVENOUS

## 2021-05-08 MED ORDER — HYDROXYZINE PAMOATE 50 MG PO CAPS
50.0000 mg | ORAL_CAPSULE | Freq: Four times a day (QID) | ORAL | Status: DC | PRN
  Filled 2021-05-08: qty 1

## 2021-05-08 MED ORDER — BUPIVACAINE HCL (PF) 0.25 % IJ SOLN
INTRAMUSCULAR | Status: DC | PRN
  Administered 2021-05-08: 10 mL

## 2021-05-08 MED ORDER — DEXMEDETOMIDINE (PRECEDEX) IN NS 20 MCG/5ML (4 MCG/ML) IV SYRINGE
PREFILLED_SYRINGE | INTRAVENOUS | Status: DC | PRN
  Administered 2021-05-08 (×2): 8 ug via INTRAVENOUS

## 2021-05-08 MED ORDER — HYDROXYZINE HCL 25 MG PO TABS: 50.0000 mg | ORAL_TABLET | Freq: Four times a day (QID) | ORAL | Status: DC | PRN

## 2021-05-08 MED ORDER — MORPHINE SULFATE (PF) 0.5 MG/ML IJ SOLN
INTRAMUSCULAR | Status: AC
  Filled 2021-05-08: qty 10

## 2021-05-08 MED ORDER — INSULIN DETEMIR 100 UNIT/ML ~~LOC~~ SOLN
20.0000 [IU] | Freq: Every day | SUBCUTANEOUS | Status: DC
  Administered 2021-05-08: 20 [IU] via SUBCUTANEOUS
  Filled 2021-05-08 (×3): qty 0.2

## 2021-05-08 MED ORDER — OXYCODONE HCL 5 MG PO TABS
5.0000 mg | ORAL_TABLET | ORAL | Status: DC | PRN
  Administered 2021-05-09 – 2021-05-10 (×4): 10 mg via ORAL
  Filled 2021-05-08 (×4): qty 2

## 2021-05-08 MED ORDER — DIBUCAINE (PERIANAL) 1 % EX OINT: 1.0000 "application " | TOPICAL_OINTMENT | CUTANEOUS | Status: DC | PRN

## 2021-05-08 MED ORDER — SODIUM CHLORIDE 0.9% FLUSH: 3.0000 mL | INTRAVENOUS | Status: DC | PRN

## 2021-05-08 MED ORDER — METFORMIN HCL 500 MG PO TABS
1000.0000 mg | ORAL_TABLET | Freq: Two times a day (BID) | ORAL | Status: DC
  Administered 2021-05-08 – 2021-05-10 (×3): 1000 mg via ORAL
  Filled 2021-05-08 (×3): qty 2

## 2021-05-08 MED ORDER — NALOXONE HCL 0.4 MG/ML IJ SOLN: 0.4000 mg | INTRAMUSCULAR | Status: DC | PRN

## 2021-05-08 MED ORDER — MIDAZOLAM HCL 2 MG/2ML IJ SOLN
INTRAMUSCULAR | Status: AC
  Filled 2021-05-08: qty 2

## 2021-05-08 MED ORDER — MENTHOL 3 MG MT LOZG: 1.0000 | LOZENGE | OROMUCOSAL | Status: DC | PRN

## 2021-05-08 MED ORDER — POVIDONE-IODINE 10 % EX SWAB
2.0000 "application " | Freq: Once | CUTANEOUS | Status: AC
  Administered 2021-05-08: 2 via TOPICAL

## 2021-05-08 MED ORDER — DEXAMETHASONE SODIUM PHOSPHATE 10 MG/ML IJ SOLN
INTRAMUSCULAR | Status: AC
  Filled 2021-05-08: qty 1

## 2021-05-08 MED ORDER — LABETALOL HCL 200 MG PO TABS
200.0000 mg | ORAL_TABLET | Freq: Two times a day (BID) | ORAL | Status: DC
  Administered 2021-05-08 – 2021-05-10 (×4): 200 mg via ORAL
  Filled 2021-05-08 (×4): qty 1

## 2021-05-08 MED ORDER — SOD CITRATE-CITRIC ACID 500-334 MG/5ML PO SOLN
30.0000 mL | ORAL | Status: AC
  Administered 2021-05-08: 30 mL via ORAL

## 2021-05-08 MED ORDER — MEPERIDINE HCL 25 MG/ML IJ SOLN: 6.2500 mg | INTRAMUSCULAR | Status: DC | PRN

## 2021-05-08 MED ORDER — BUPIVACAINE HCL (PF) 0.25 % IJ SOLN
INTRAMUSCULAR | Status: AC
  Filled 2021-05-08: qty 30

## 2021-05-08 MED ORDER — WITCH HAZEL-GLYCERIN EX PADS: 1.0000 "application " | MEDICATED_PAD | CUTANEOUS | Status: DC | PRN

## 2021-05-08 MED ORDER — BUPIVACAINE IN DEXTROSE 0.75-8.25 % IT SOLN
INTRATHECAL | Status: DC | PRN
  Administered 2021-05-08: 1.6 mL via INTRATHECAL

## 2021-05-08 MED ORDER — NALBUPHINE HCL 10 MG/ML IJ SOLN
5.0000 mg | INTRAMUSCULAR | Status: DC | PRN
Start: 2021-05-08 — End: 2021-05-10

## 2021-05-08 MED ORDER — OXYTOCIN-SODIUM CHLORIDE 30-0.9 UT/500ML-% IV SOLN: 2.5000 [IU]/h | INTRAVENOUS | Status: AC

## 2021-05-08 SURGICAL SUPPLY — 46 items
BARRIER ADHS 3X4 INTERCEED (GAUZE/BANDAGES/DRESSINGS) ×2 IMPLANT
BENZOIN TINCTURE PRP APPL 2/3 (GAUZE/BANDAGES/DRESSINGS) IMPLANT
CHLORAPREP W/TINT 26ML (MISCELLANEOUS) ×2 IMPLANT
CLAMP CORD UMBIL (MISCELLANEOUS) IMPLANT
CLOTH BEACON ORANGE TIMEOUT ST (SAFETY) ×2 IMPLANT
DRAPE C SECTION CLR SCREEN (DRAPES) ×2 IMPLANT
DRESSING PREVENA PLUS CUSTOM (GAUZE/BANDAGES/DRESSINGS) ×1 IMPLANT
DRSG OPSITE POSTOP 4X10 (GAUZE/BANDAGES/DRESSINGS) ×2 IMPLANT
DRSG PREVENA PLUS CUSTOM (GAUZE/BANDAGES/DRESSINGS) ×2
ELECT REM PT RETURN 9FT ADLT (ELECTROSURGICAL) ×2
ELECTRODE REM PT RTRN 9FT ADLT (ELECTROSURGICAL) ×1 IMPLANT
EXTRACTOR VACUUM M CUP 4 TUBE (SUCTIONS) IMPLANT
GLOVE BIOGEL PI IND STRL 7.0 (GLOVE) ×2 IMPLANT
GLOVE BIOGEL PI INDICATOR 7.0 (GLOVE) ×2
GLOVE ECLIPSE 6.5 STRL STRAW (GLOVE) ×2 IMPLANT
GOWN STRL REUS W/TWL LRG LVL3 (GOWN DISPOSABLE) ×4 IMPLANT
KIT ABG SYR 3ML LUER SLIP (SYRINGE) IMPLANT
NEEDLE HYPO 22GX1.5 SAFETY (NEEDLE) ×2 IMPLANT
NEEDLE HYPO 25X5/8 SAFETYGLIDE (NEEDLE) IMPLANT
NS IRRIG 1000ML POUR BTL (IV SOLUTION) ×2 IMPLANT
PACK C SECTION WH (CUSTOM PROCEDURE TRAY) ×2 IMPLANT
PAD OB MATERNITY 4.3X12.25 (PERSONAL CARE ITEMS) ×2 IMPLANT
RETAINER VISCERAL (MISCELLANEOUS) ×2 IMPLANT
RETRACTOR TRAXI PANNICULUS (MISCELLANEOUS) ×1 IMPLANT
RTRCTR C-SECT PINK 25CM LRG (MISCELLANEOUS) IMPLANT
STRIP CLOSURE SKIN 1/2X4 (GAUZE/BANDAGES/DRESSINGS) IMPLANT
SUT CHROMIC GUT AB #0 18 (SUTURE) IMPLANT
SUT MNCRL 0 VIOLET CTX 36 (SUTURE) ×3 IMPLANT
SUT MON AB 2-0 SH 27 (SUTURE)
SUT MON AB 2-0 SH27 (SUTURE) IMPLANT
SUT MON AB 3-0 SH 27 (SUTURE)
SUT MON AB 3-0 SH27 (SUTURE) IMPLANT
SUT MON AB 4-0 PS1 27 (SUTURE) IMPLANT
SUT MONOCRYL 0 CTX 36 (SUTURE) ×3
SUT PLAIN 2 0 (SUTURE)
SUT PLAIN 2 0 XLH (SUTURE) IMPLANT
SUT PLAIN ABS 2-0 CT1 27XMFL (SUTURE) IMPLANT
SUT VIC AB 0 CT1 36 (SUTURE) ×4 IMPLANT
SUT VIC AB 2-0 CT1 27 (SUTURE) ×1
SUT VIC AB 2-0 CT1 TAPERPNT 27 (SUTURE) ×1 IMPLANT
SUT VIC AB 4-0 PS2 27 (SUTURE) IMPLANT
SYR CONTROL 10ML LL (SYRINGE) ×2 IMPLANT
TOWEL OR 17X24 6PK STRL BLUE (TOWEL DISPOSABLE) ×2 IMPLANT
TRAXI PANNICULUS RETRACTOR (MISCELLANEOUS) ×1
TRAY FOLEY W/BAG SLVR 14FR LF (SET/KITS/TRAYS/PACK) IMPLANT
WATER STERILE IRR 1000ML POUR (IV SOLUTION) ×2 IMPLANT

## 2021-05-08 NOTE — Progress Notes (Signed)
Patient only wanted 10 units if Novolog with her evening meal. She stated 25 mg of Novolog might be too much, which is what had been ordered with meals.

## 2021-05-08 NOTE — Transfer of Care (Signed)
Immediate Anesthesia Transfer of Care Note  Patient: Nicole Stuart  Procedure(s) Performed: CESAREAN SECTION  Patient Location: PACU  Anesthesia Type:Spinal  Level of Consciousness: awake and alert   Airway & Oxygen Therapy: Patient Spontanous Breathing  Post-op Assessment: Report given to RN and Post -op Vital signs reviewed and stable  Post vital signs: Reviewed and stable  Last Vitals:  Vitals Value Taken Time  BP 131/71 05/08/21 1418  Temp    Pulse 74 05/08/21 1426  Resp 20 05/08/21 1426  SpO2 96 % 05/08/21 1426  Vitals shown include unvalidated device data.  Last Pain:  Vitals:   05/08/21 1100  PainSc: 0-No pain         Complications: No notable events documented.

## 2021-05-08 NOTE — Anesthesia Preprocedure Evaluation (Addendum)
Anesthesia Evaluation  Patient identified by MRN, date of birth, ID band Patient awake    Reviewed: Allergy & Precautions, NPO status , Patient's Chart, lab work & pertinent test results  Airway Mallampati: II  TM Distance: >3 FB Neck ROM: Full    Dental no notable dental hx.    Pulmonary sleep apnea ,  OSA resolved after tonsillectomy    Pulmonary exam normal breath sounds clear to auscultation       Cardiovascular hypertension, Normal cardiovascular exam Rhythm:Regular Rate:Normal  Patent ductus arteriosus  ECG: NSR, rate 86   Neuro/Psych negative neurological ROS     GI/Hepatic negative GI ROS, Neg liver ROS,   Endo/Other  diabetes, Type 2, Oral Hypoglycemic Agents, Insulin Dependent  Renal/GU negative Renal ROS     Musculoskeletal negative musculoskeletal ROS (+)   Abdominal   Peds  Hematology  (+) Sickle cell trait and anemia ,   Anesthesia Other Findings   Reproductive/Obstetrics                           Anesthesia Physical Anesthesia Plan  ASA: 3  Anesthesia Plan: Spinal   Post-op Pain Management:    Induction: Intravenous  PONV Risk Score and Plan: 2 and Ondansetron, Dexamethasone and Treatment may vary due to age or medical condition  Airway Management Planned: Natural Airway  Additional Equipment:   Intra-op Plan:   Post-operative Plan:   Informed Consent: I have reviewed the patients History and Physical, chart, labs and discussed the procedure including the risks, benefits and alternatives for the proposed anesthesia with the patient or authorized representative who has indicated his/her understanding and acceptance.     Dental advisory given  Plan Discussed with: CRNA  Anesthesia Plan Comments:         Anesthesia Quick Evaluation

## 2021-05-08 NOTE — Interval H&P Note (Signed)
History and Physical Interval Note:  05/08/2021 12:02 PM  Nicole Stuart  has presented today for surgery, with the diagnosis of GDM 37weeks MFM recommend del.  The various methods of treatment have been discussed with the patient and family. After consideration of risks, benefits and other options for treatment, the patient has consented to  Procedure(s): CESAREAN SECTION (N/A) as a surgical intervention.  The patient's history has been reviewed, patient examined, no change in status, stable for surgery.  I have reviewed the patient's chart and labs.  Questions were answered to the patient's satisfaction.     Brandalyn Harting A Javionna Leder

## 2021-05-08 NOTE — Anesthesia Procedure Notes (Signed)
Spinal  Patient location during procedure: OR Start time: 05/08/2021 12:32 PM End time: 05/08/2021 12:37 PM Reason for block: surgical anesthesia Staffing Performed: anesthesiologist  Anesthesiologist: Leonides Grills, MD Preanesthetic Checklist Completed: patient identified, IV checked, risks and benefits discussed, surgical consent, monitors and equipment checked, pre-op evaluation and timeout performed Spinal Block Patient position: sitting Prep: DuraPrep Patient monitoring: cardiac monitor, continuous pulse ox and blood pressure Approach: midline Location: L4-5 Injection technique: single-shot Needle Needle type: Pencan  Needle gauge: 24 G Needle length: 9 cm Assessment Sensory level: T10 Events: CSF return Additional Notes Functioning IV was confirmed and monitors were applied. Sterile prep and drape, including hand hygiene and sterile gloves were used. The patient was positioned and the spine was prepped. The skin was anesthetized with lidocaine.  Patient reported transient right sided paresthesia upon dural puncture with immediate resolution. Free flow of clear CSF was obtained prior to injecting local anesthetic into the CSF.  The spinal needle aspirated freely following injection.  The needle was carefully withdrawn.  The patient tolerated the procedure well.

## 2021-05-08 NOTE — Lactation Note (Addendum)
This note was copied from a baby's chart. Lactation Consultation Note  Patient Name: Nicole Stuart Today's Date: 05/08/2021 Reason for consult: Initial assessment;Mother's request;Difficult latch;Early term 37-38.6wks;Maternal endocrine disorder (GHTN Labetalol) Age:32 hours LC attempted to latch infant had a recent feeding.   Mom using 27 flange, reduced 24 states better fit. Mom short shafted nipples. Mom to pre pump 5-10 min before latching.   Plan 1. To feed based on cues 8-12x in 24 hr period. Mom to try lathing infant on stomach to get more depth with breast compression.  2. If unable to latch, Mom to supplement with EBM first followed by [facility] with pace bottle feeding and yellow slow flow nipple. Mom aware if infant not latching she can offer more.  3. DEBP q 3hrs for 15 min 4 I and O sheet reviewed.  5 LC brochure of inpatient and outpatient services reviewed.  All questions answered at the end of the visit.  RN, Wallie Renshaw aware infant did not latch at this feeding.  Mom to call RN or [facility] for assistance for next feeding.   Maternal Data Has patient been taught Hand Expression?: Yes Does the patient have breastfeeding experience prior to this delivery?: Yes How long did the patient breastfeed?: 6 months milk supply diminished after going back to work  Feeding Mother's Current Feeding Choice: Breast Milk and Donor Milk  LATCH Score                    Lactation Tools Discussed/Used Tools: Pump;Flanges Flange Size: 24 (Mom using 27 reduced to 24, she stated better fit.) Breast pump type: Double-Electric Breast Pump Pump Education: Setup, frequency, and cleaning;Milk Storage Reason for Pumping: increase stimulation Pumping frequency: every 3 hrs for 15 min  Interventions Interventions: Breast feeding basics reviewed;DEBP;Skin to skin;Support pillows;Breast massage;Position options;Hand express;Expressed milk;Education;Pre-pump if needed  Discharge Pump:  (Mom  [employer] employee. mom to call once she decides on her pump)  Consult Status Consult Status: Follow-up Date: 05/09/21 Follow-up type: In-patient    Meng Winterton  Nicholson-Springer 05/08/2021, 7:11 PM

## 2021-05-08 NOTE — Brief Op Note (Signed)
05/08/2021  12:19 PM  PATIENT:  Nicole Stuart  30 y.o. female  PRE-OPERATIVE DIAGNOSIS: Previous Cesarean section, poorly controlled Class B DM, chronic HTn in third trimester, IUP @ 37 wk  POST-OPERATIVE DIAGNOSIS:  same  PROCEDURE:  Repeat Cesarean section, kerr hysterotomy  SURGEON:  Surgeon(s) and Role:    * Maxie Better, MD - Primary  PHYSICIAN ASSISTANT:   ASSISTANTS: none   ANESTHESIA:   spinal FINDINGS; live female  right hand compound presentation, nl tubes and ovaries EBL:    BLOOD ADMINISTERED:none  DRAINS: none   LOCAL MEDICATIONS USED:  MARCAINE     SPECIMEN:  Source of Specimen:  placenta  DISPOSITION OF SPECIMEN:  N/A  COUNTS:  YES  TOURNIQUET:  * No tourniquets in log *  DICTATION: .Other Dictation: Dictation Number [id number]  PLAN OF CARE: Admit to inpatient   PATIENT DISPOSITION:  PACU - hemodynamically stable.   Delay start of Pharmacological VTE agent (>24hrs) due to surgical blood loss or risk of bleeding: no

## 2021-05-08 NOTE — Anesthesia Postprocedure Evaluation (Signed)
Anesthesia Post Note  Patient: Nicole Stuart  Procedure(s) Performed: CESAREAN SECTION     Patient location during evaluation: PACU Anesthesia Type: Spinal Level of consciousness: awake Pain management: pain level controlled Vital Signs Assessment: post-procedure vital signs reviewed and stable Respiratory status: spontaneous breathing, respiratory function stable and patient connected to nasal cannula oxygen Cardiovascular status: blood pressure returned to baseline and stable Postop Assessment: no headache, no backache and no apparent nausea or vomiting Anesthetic complications: no   No notable events documented.  Last Vitals:  Vitals:   05/08/21 1517 05/08/21 1630  BP: 99/79 131/70  Pulse:  71  Resp: 16 18  Temp:  36.6 C  SpO2:  98%    Last Pain:  Vitals:   05/08/21 1630  TempSrc: Oral  PainSc:    Pain Goal:                   Nicole Stuart Nicole Stuart

## 2021-05-08 NOTE — Progress Notes (Signed)
Inpatient Diabetes Program Recommendations  Inpatient Diabetes Program Recommendations  Diabetes Treatment Program Recommendations  ADA Standards of Care 2018 Diabetes in Pregnancy Target Glucose Ranges:  Fasting: 60 - 90 mg/dL Preprandial: 60 - 503 mg/dL 1 hr postprandial: Less than 140mg /dL (from first bite of meal) 2 hr postprandial: Less than 120 mg/dL (from first bite of meal)        Review of Glycemic Control  Diabetes history: DM2 Outpatient Diabetes medications: Semglee 40 units hs, Humalog 25 units tid meal coverage, Metformin 1 gm bid  Inpatient Diabetes Program Recommendations:   Noted patient admitted for C section. Recommend continue CGM monitoring during surgery, then post-op -Glycemic control order set with CBGs q 4 hrs. X 24 hrs., then tid ac meals + hs 0-5 units. May need to add Metformin 1 gm bid post delivery and evaluate if insulin needed.  Thank you, . Siddalee Vanderheiden, RN, MSN, CDE  Diabetes Coordinator Inpatient Glycemic Control Team Team Pager [phone] (8am-5pm) 05/08/2021 12:48 PM

## 2021-05-08 NOTE — H&P (Signed)
Nicole Stuart is a 30 y.o. female presenting  @ [redacted] weeks gestation for  repeat C/S due to poorly controlled Class B DM per MFM. OB History     Gravida  2   Para  1   Term  1   Preterm      AB      Living  1      SAB      IAB      Ectopic      Multiple      Live Births             Past Medical History:  Diagnosis Date   Asthma    Diabetes mellitus type 2 in obese (HCC) 11/02/2020   Dysrhythmia    first degree heart block.    Essential hypertension 11/02/2020   Gestational diabetes    Heart murmur    Hypertension    Patent ductus arteriosus    Sleep apnea    study done two weeks ago [place]    Past Surgical History:  Procedure Laterality Date   CESAREAN SECTION     NASAL SEPTOPLASTY W/ TURBINOPLASTY  09/22/2011   Procedure: NASAL SEPTOPLASTY WITH TURBINATE REDUCTION;  Surgeon: Drema Halon, MD;  Location: [facility] OR;  Service: ENT;  Laterality: Bilateral;   TONSILLECTOMY  09/22/2011   Procedure: TONSILLECTOMY;  Surgeon: Drema Halon, MD;  Location: [facility] OR;  Service: ENT;  Laterality: Bilateral;   Family History: family history includes Diabetes in her mother; Hypertension in her brother and sister; Obesity in her mother. She was adopted. Social History:  reports that she has never smoked. She has never used smokeless tobacco. She reports that she does not drink alcohol and does not use drugs.     Maternal Diabetes: Yes:  Diabetes Type:  Pre-pregnancy, Insulin/Medication controlled Genetic Screening: Declined Maternal Ultrasounds/Referrals: Normal Fetal Ultrasounds or other Referrals:  Fetal echo, Referred to Materal Fetal Medicine  Maternal Substance Abuse:  No Significant Maternal Medications:  Meds include: Other: tresiba, humulog, baby ASA, labetalol Significant Maternal Lab Results:  Group B Strep negative Other Comments:   chronic HTN, Class B DM , hgb C trait  Review of Systems  All other systems reviewed and are negative. History    Last menstrual period 08/15/2020. Maternal Exam:  Introitus: Normal vulva.  Physical Exam Constitutional:      Appearance: Normal appearance.  HENT:     Left Ear: Tympanic membrane normal.  Eyes:     Extraocular Movements: Extraocular movements intact.  Cardiovascular:     Rate and Rhythm: Regular rhythm.     Heart sounds: Normal heart sounds.  Pulmonary:     Breath sounds: Normal breath sounds.  Abdominal:     Palpations: Abdomen is soft.     Comments: Gravid non tender. Pfannensteil scar  Genitourinary:    General: Normal vulva.  Musculoskeletal:        General: Normal range of motion.     Cervical back: Neck supple.  Skin:    General: Skin is warm and dry.  Neurological:     General: No focal deficit present.     Mental Status: She is alert and oriented to person, place, and time.  Psychiatric:        Mood and Affect: Mood normal.        Behavior: Behavior normal.    Prenatal labs: ABO, Rh: --/--/O POS (09/19 1058) Antibody: NEG (09/19 1058) Rubella: Immune (03/22 0000) RPR: NON REACTIVE (09/19  1059)  HBsAg: Negative (03/22 0000)  HIV: Non-reactive (03/22 0000)  GBS:   negative  Assessment/Plan: Class B DM on insulin Previous C/S Chronic HTN IUP @ 37 wk  P) repeat C/S. Procedure explained. Risk of surgery reviewed including infection, bleeding, injury to surrounding organ structures. Internal scar tissue, possible need for blood transfusion and its risk, All ? answered  Nicole Stuart 05/08/2021, 4:40 AM

## 2021-05-09 ENCOUNTER — Ambulatory Visit: Payer: 59

## 2021-05-09 ENCOUNTER — Encounter (HOSPITAL_COMMUNITY): Payer: Self-pay | Admitting: Obstetrics and Gynecology

## 2021-05-09 LAB — CBC
HCT: 26.5 % — ABNORMAL LOW (ref 36.0–46.0)
Hemoglobin: 9.2 g/dL — ABNORMAL LOW (ref 12.0–15.0)
MCH: 25 pg — ABNORMAL LOW (ref 26.0–34.0)
MCHC: 34.7 g/dL (ref 30.0–36.0)
MCV: 72 fL — ABNORMAL LOW (ref 80.0–100.0)
Platelets: 281 10*3/uL (ref 150–400)
RBC: 3.68 MIL/uL — ABNORMAL LOW (ref 3.87–5.11)
RDW: 16 % — ABNORMAL HIGH (ref 11.5–15.5)
WBC: 13.5 10*3/uL — ABNORMAL HIGH (ref 4.0–10.5)
nRBC: 0 % (ref 0.0–0.2)

## 2021-05-09 LAB — GLUCOSE, CAPILLARY
Glucose-Capillary: 42 mg/dL — CL (ref 70–99)
Glucose-Capillary: 58 mg/dL — ABNORMAL LOW (ref 70–99)
Glucose-Capillary: 87 mg/dL (ref 70–99)
Glucose-Capillary: 94 mg/dL (ref 70–99)

## 2021-05-09 LAB — BIRTH TISSUE RECOVERY COLLECTION (PLACENTA DONATION)

## 2021-05-09 MED ORDER — INSULIN DETEMIR 100 UNIT/ML ~~LOC~~ SOLN: 20.0000 [IU] | SUBCUTANEOUS | Status: DC

## 2021-05-09 MED ORDER — INSULIN ASPART 100 UNIT/ML IJ SOLN: 0.0000 [IU] | Freq: Every day | INTRAMUSCULAR | Status: DC

## 2021-05-09 MED ORDER — INSULIN ASPART 100 UNIT/ML IJ SOLN
0.0000 [IU] | Freq: Three times a day (TID) | INTRAMUSCULAR | Status: DC
Start: 2021-05-09 — End: 2021-05-10

## 2021-05-09 MED ORDER — INSULIN ASPART 100 UNIT/ML IJ SOLN: 15.0000 [IU] | Freq: Three times a day (TID) | INTRAMUSCULAR | Status: DC

## 2021-05-09 NOTE — Op Note (Signed)
NAME: Nicole Stuart, Nicole Stuart. MEDICAL RECORD NO: [id number] ACCOUNT NO: [id number] DATE OF BIRTH: 01-26-1991 FACILITY: [facility] LOCATION: [facility] PHYSICIAN: Shery Wauneka A. Cherly Hensen, MD  Operative Report   DATE OF PROCEDURE: 05/08/2021  PREOPERATIVE DIAGNOSES:  previous cesarean section, Class B diabetes poorly controlled, chronic hypertension in third trimester, intrauterine gestation at 37 weeks.  PROCEDURES: Repeat cesarean section, Kerr hysterotomy.  POSTOPERATIVE DIAGNOSES:  previous cesarean section, class B diabetes poorly controlled, chronic hypertension in third trimester, intrauterine gestation at 37 weeks.  ANESTHESIA:  Spinal.  SURGEON:  Maxie Better, MD  ASSISTANT:  None.  DESCRIPTION OF PROCEDURE:  Under adequate spinal anesthesia, the patient was placed in the supine position with a left lateral tilt.  She was sterilely prepped and draped in the usual fashion.  An indwelling Foley catheter was sterilely placed, traxi  retractor was then placed, 0.25% Marcaine was injected along the previous Pfannenstiel skin incision site.  Pfannenstiel skin incision was then made, carried down to the rectus fascia.  Rectus fascia opened transversely.  The rectus fascia was then  bluntly, sharply dissected off the rectus muscles in a superior and inferior fashion.  The rectus muscle was split in the midline.  The parietal peritoneum was entered sharply and extended.  A self-retaining Alexis retractor was then placed.   Vesicouterine peritoneum was noted to be adherent to the lower uterine segment.  Careful dissection of the vesicouterine peritoneum lead to the bladder being sharply dissected and displaced inferiorly.  Curvilinear low transverse small incision was then  made and extended bluntly in a cephalic and caudad manner using blunt dissection.  Artificial rupture of membrane was then done.  Clear amniotic fluid was noted.  Initial attempt at delivery of the vertex was unsuccessful.  Vacuum was  applied with  subsequent delivery of a  live female baby whose right arm was compound presentation.  The arm was reduced.  Subsequently, baby was delivered.  Cord clamp was done for a minute.  Cord was subsequently clamped, cut.  The baby was transferred to the waiting  pediatricians who assigned Apgars of 8 and 9 at 1 and 5 minutes.  Placenta was spontaneous, intact and not sent to pathology.  Uterine cavity was cleaned of debris.  Uterine incision had no extension.  Uterine incision was closed in two layers, the first  layer was 0 Monocryl running locked stitch, second layer was imbricated using 0 Monocryl suture.  Normal tubes and ovaries were noted bilaterally.  Abdomen was irrigated, suctioned of debris.  Interceed was placed in the inverted T fashion overlying  incision.  The Alexis retractor was then removed.  Fish was placed due to the protrusion of the bowel.  The parietal peritoneum was then closed with 2-0 Vicryl.  The fish was removed.  The rectus fascia was closed with 0 Vicryl x2.  The subcutaneous area  was irrigated, small bleeders cauterized.  Interrupted 2-0 plain suture was placed.  The skin was approximated using 4-0 Vicryl subcuticular closure. Prevena wound VAC was placed on the incision due to slight oozing.  SPECIMENS:  Placenta, not sent to pathology.  ESTIMATED BLOOD LOSS: 404 mL.  INTRAOPERATIVE FLUIDS: 1500 mL.  URINE OUTPUT:  200 mL.  Sponge and instrument counts x2 was correct.  Complication was none.  The patient tolerated the procedure well and was transferred to recovery room in stable condition.   SHW D: 05/09/2021 6:04:06 am T: 05/09/2021 7:10:00 am  JOB: [id number]/ [id number]

## 2021-05-09 NOTE — Progress Notes (Signed)
CRITICAL VALUE STICKER  CRITICAL VALUE: Blood glucose 42, 58 (one touch)  RECEIVER (on-site recipient of call):BDaly, RN  DATE & TIME NOTIFIED: 1647, 1714 per NT  MESSENGER (representative from lab):N/A  MD NOTIFIED: Dr. Cherly Hensen  TIME OF NOTIFICATION:1745  RESPONSE:  Initially, patient given juice and crackers for 42 blood sugar.Will recheck blood sugar in 30 minutes, pt alert, talkative and active in her care.  Repeat blood sugar 58, requested soda, remains alert, thinks the Novalog is causing blood sugar to be low. Dr. Cherly Hensen notified. Dr Cherly Hensen talked with patient and RN via vocera. Patient does not want Novalog.  Orders per Dr. Cherly Hensen to hold Novalog now and use sliding scale insulin for coverage as needed. Hold Metformin until blood sugars stabilize. Patient to eat meals regularly. Meal has ben ordered. Patient has her on continues blood sugar monitoring system. Her current blood sugar is 99. Patient walking around room, alert, talkative and states I'm feeling fine now".

## 2021-05-09 NOTE — Lactation Note (Signed)
This note was copied from a baby's chart. Lactation Consultation Note  Patient Name: Nicole Stuart Today's Date: 05/09/2021   Age:32 hours,ETI female infant. LC entered the room, mom and infant asleep at this time.  Maternal Data    Feeding Nipple Type: Slow - flow  LATCH Score              Lactation Tools Discussed/Used    Interventions    Discharge    Consult Status      Danelle Earthly 05/09/2021, 1:40 AM

## 2021-05-09 NOTE — Progress Notes (Signed)
SVD: repeat  S:  Pt reports feeling well. Fussing regarding Prevena wound vac / Tolerating po/ Voiding without problems/ No n/v/ Bleeding is light/ Pain controlled withprescription NSAID's including ibuprofen (Motrin) and narcotic analgesics including oxycodone/acetaminophen (Percocet, Tylox)    O:  A & O x 3 / VS: Blood pressure 120/67, pulse 68, temperature 98 F (36.7 C), temperature source Oral, resp. rate 16, last menstrual period 08/15/2020, SpO2 100 %, unknown if currently breastfeeding.  LABS:  Results for orders placed or performed during the hospital encounter of 05/08/21 (from the past 24 hour(s))  Glucose, capillary     Status: None   Collection Time: 05/08/21  3:02 PM  Result Value Ref Range   Glucose-Capillary 85 70 - 99 mg/dL  Glucose, capillary     Status: Abnormal   Collection Time: 05/08/21  5:22 PM  Result Value Ref Range   Glucose-Capillary 104 (H) 70 - 99 mg/dL  CBC     Status: Abnormal   Collection Time: 05/09/21  5:43 AM  Result Value Ref Range   WBC 13.5 (H) 4.0 - 10.5 K/uL   RBC 3.68 (L) 3.87 - 5.11 MIL/uL   Hemoglobin 9.2 (L) 12.0 - 15.0 g/dL   HCT 59.7 (L) 41.6 - 38.4 %   MCV 72.0 (L) 80.0 - 100.0 fL   MCH 25.0 (L) 26.0 - 34.0 pg   MCHC 34.7 30.0 - 36.0 g/dL   RDW 53.6 (H) 46.8 - 03.2 %   Platelets 281 150 - 400 K/uL   nRBC 0.0 0.0 - 0.2 %  Collect bld for placenta donatation     Status: None   Collection Time: 05/09/21  5:43 AM  Result Value Ref Range   Placenta donation bld collect Collected by Laboratory     I&O: I/O last 3 completed shifts: In: 2050 [P.O.:250; I.V.:600; Other:1200] Out: 2247 [Urine:1540; Blood:707]   Total I/O In: -  Out: 300 [Urine:300]  Lungs: chest clear, no wheezing, rales, normal symmetric air entry  Heart: regular rate and rhythm, S1, S2 normal, no murmur, click, rub or gallop  Abdomen: benign non-tender, without masses or organomegaly palpable. Uterus at umb deviated to right Wound: dressing d/c/i  Perineum: not  inspected  Lochia: light  Extremities:no redness or tenderness in the calves or thighs, no edema    A/P: POD # 1/PPD # 1/ [id number] Class B DM on insulin, metformin Chronic HTN on labetalol  Doing well  Continue routine post partum orders

## 2021-05-09 NOTE — Lactation Note (Signed)
This note was copied from a baby's chart. Lactation Consultation Note  Patient Name: Nicole Stuart Today's Date: 05/09/2021 Reason for consult: Follow-up assessment;Early term 37-38.6wks Age:32 hours   P2 mother whose infant is now 91 hours old.  This is an ETI at 37+0 weeks.  Mother breast fed her first child (now 20 months) for 8 months.  Baby was STS on mother's chest when I arrived.  Mother reported that baby has not been interested in feeding; is very sleepy.  Due to his gestational age and weight now < 6 lbs I reviewed the LPTI policy with mother and made suggestions on how to best meet baby's needs.  She will awaken baby every three hours if he does not self awaken and breast feed.  Mother will follow with donor breast milk supplementation and pumping for 15 minutes.  Suggested mother increase volume supplements to 10-20 or more as baby desires.  Mother had no questions regarding pump usage.  Mother stated that the #24 flanges are appropriate.  Suggested she call her RN/LC for latch assistance as needed.    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  All paperwork completed and placed in Sophie's mailbox.  Provided a receipt for mother.  No support person present at this time, however, father will return later today.  RN updated.   Maternal Data Has patient been taught Hand Expression?: Yes Does the patient have breastfeeding experience prior to this delivery?: Yes How long did the patient breastfeed?: 6 months  Feeding Mother's Current Feeding Choice: Breast Milk and Donor Milk Nipple Type: Slow - flow  LATCH Score                    Lactation Tools Discussed/Used Tools: Pump;Flanges Flange Size: 24 Breast pump type: Double-Electric Breast Pump;Manual Pump Education:  (No review needed) Reason for Pumping: Breast stimulation for supplementation Pumping frequency: Every three hours  Interventions Interventions: Breast feeding basics  reviewed;Education  Discharge Pump: DEBP;Manual;Personal ([employer] employee pump given) WIC Program: No  Consult Status Consult Status: Follow-up Date: 05/10/21 Follow-up type: In-patient    Dora Sims 05/09/2021, 5:21 PM

## 2021-05-09 NOTE — Progress Notes (Addendum)
Inpatient Diabetes Program Recommendations  AACE/ADA: New Consensus Statement on Inpatient Glycemic Control (2015)  Target Ranges:  Prepandial:   less than 140 mg/dL      Peak postprandial:   less than 180 mg/dL (1-2 hours)      Critically ill patients:  140 - 180 mg/dL   Lab Results  Component Value Date   GLUCAP 104 (H) 05/08/2021    Diabetes history: Type 2 DM Outpatient Diabetes medications: Semglee 40 units hs, Humalog 25 units tid meal coverage, Metformin 1 gm bid Pre-pregnant: Metformin 1000 mg BID, Trulicity 0.75 mg qwk Current orders for Inpatient glycemic control: Levemir 20 units QHS, Metformin 1000 mg BID, Novolog 25 units TID Decadron 10 mg x 1  Inpatient Diabetes Program Recommendations:    Consider adding Novolog 0-15 units TID & HS and decreasing meal coverage to Novolog 15 units TID (assuming patient is consuming >50% of meals).  Patient glucose trends elevated most likely d/t steroid administration. Will update safety parameters to meal coverage order.    Addendum: Spoke with patient regarding outpatient diabetes management. Sees [facility] outpatient and plans to follow up within the week. Verified outpatient doses during and prior to pregnancy. Discussed with patient decreased insulin needs now that delivered, risk for hypoglycemia with current doses, impact of steroids to glucose trends, vascular changes and commorbidities.  Patient is wearing CGM and current CBG is 80 mg/dL. In agreement to further decrease meal coverage.  Secure chat sent to MD.   Thanks, Lujean Rave, MSN, RNC-OB Diabetes Coordinator [phone] (8a-5p)

## 2021-05-10 ENCOUNTER — Other Ambulatory Visit (HOSPITAL_COMMUNITY): Payer: Self-pay

## 2021-05-10 LAB — GLUCOSE, CAPILLARY: Glucose-Capillary: 74 mg/dL (ref 70–99)

## 2021-05-10 MED ORDER — OXYCODONE HCL 5 MG PO TABS
5.0000 mg | ORAL_TABLET | ORAL | 0 refills | Status: AC | PRN
  Filled 2021-05-10: qty 30, 3d supply, fill #0

## 2021-05-10 MED ORDER — INSULIN GLARGINE-YFGN 100 UNIT/ML ~~LOC~~ SOPN
20.0000 [IU] | PEN_INJECTOR | Freq: Every day | SUBCUTANEOUS | 11 refills | Status: DC
  Filled 2021-05-10: qty 30, 150d supply, fill #0
  Filled 2022-03-17: qty 15, 75d supply, fill #0

## 2021-05-10 MED ORDER — IBUPROFEN 600 MG PO TABS
600.0000 mg | ORAL_TABLET | Freq: Four times a day (QID) | ORAL | 11 refills | Status: DC | PRN
  Filled 2021-05-10: qty 30, 8d supply, fill #0

## 2021-05-10 NOTE — Progress Notes (Signed)
Inpatient Diabetes Program Recommendations  AACE/ADA: New Consensus Statement on Inpatient Glycemic Control (2015)  Target Ranges:  Prepandial:   less than 140 mg/dL      Peak postprandial:   less than 180 mg/dL (1-2 hours)      Critically ill patients:  140 - 180 mg/dL   Lab Results  Component Value Date   GLUCAP 74 05/10/2021    Review of Glycemic Control Results for Nicole Stuart, Nicole Stuart (MRN [id number]) as of 05/10/2021 10:52  Ref. Range 05/09/2021 16:47 05/09/2021 17:14 05/09/2021 18:15 05/09/2021 22:07 05/10/2021 08:36  Glucose-Capillary Latest Ref Range: 70 - 99 mg/dL 42 (LL) 58 (L) 87 94 74  Diabetes history: Type 2 DM Outpatient Diabetes medications: Semglee 40 units hs, Humalog 25 units tid meal coverage, Metformin 1 gm bid Pre-pregnant: Metformin 1000 mg BID, Trulicity 0.75 mg qwk Current orders for Inpatient glycemic control: Levemir 20 units QHS, Metformin 1000 mg BID, Novolog 25 units TID  Inpatient Diabetes Program Recommendations:    Please consider d/c of Novolog meal coverage 25 units tid. Patient did not have basal insulin on 9/22 and fasting blood sugar =74 mg/dL.   She may not need basal insulin either?   Thanks,  Nicole Meager, RN, BC-ADM Inpatient Diabetes Coordinator Pager [phone]  (8a-5p)

## 2021-05-10 NOTE — Discharge Summary (Signed)
Postpartum Discharge Summary  Date of Service updated     Patient Name: Nicole Stuart DOB: 1990-12-27 MRN: [id number]  Date of admission: 05/08/2021 Delivery date:05/08/2021  Delivering provider: Avalina Benko  Date of discharge: 05/10/2021  Admitting diagnosis:  Previous Cesarean section , Class B DM poorly controlled, chronic HTN in third trimester, IUP @ 37 wk Intrauterine pregnancy: [redacted]w[redacted]d    Secondary diagnosis:  Active Problems:   Modified White class B1 pregestational diabetes mellitus   Postpartum care following cesarean delivery  Additional problems: n/a    Discharge diagnosis: Preterm Pregnancy Delivered, CHTN, Type 2 DM, and Anemia                                              Post partum procedures: n/a Augmentation: N/A Complications: None  Hospital course: Sceduled C/S   30y.o. yo G2P2002 at 334w0das admitted to the hospital 05/08/2021 for scheduled cesarean section with the following indication:Elective Repeat and poorly controlled Class B DM, chronic HTN in third trimester .Delivery details are as follows:  Membrane Rupture Time/Date: 1:02 PM ,05/08/2021   Delivery Method:C-Section, Low Transverse  Details of operation can be found in separate operative note.  Patient had an uncomplicated postpartum course.  She is ambulating, tolerating a regular diet, passing flatus, and urinating well. Patient is discharged home in stable condition on  05/10/2021        Newborn Data: Birth date:05/08/2021  Birth time:1:03 PM  Gender:Female  Living status:Living  Apgars:8 ,9  Weight:2.775 kg     Magnesium Sulfate received: No BMZ received: No Rhophylac:No MMR:No T-DaP:Given prenatally Flu: No Transfusion:No  Physical exam  Vitals:   05/09/21 1545 05/09/21 1714 05/10/21 0611 05/10/21 1103  BP: (!) 144/70 126/72 133/66 134/80  Pulse: 76 71 74 73  Resp: 18  18   Temp: 97.6 F (36.4 C)  97.6 F (36.4 C)   TempSrc: Oral  Oral   SpO2:   99%    General: alert,  cooperative, and no distress Lochia: appropriate Uterine Fundus: firm Incision: Dressing is clean, dry, and intact DVT Evaluation: No evidence of DVT seen on physical exam. No significant calf/ankle edema. Labs: Lab Results  Component Value Date   WBC 13.5 (H) 05/09/2021   HGB 9.2 (L) 05/09/2021   HCT 26.5 (L) 05/09/2021   MCV 72.0 (L) 05/09/2021   PLT 281 05/09/2021   CMP Latest Ref Rng & Units 03/23/2021  Glucose 70 - 99 mg/dL 85  BUN 6 - 20 mg/dL 5(L)  Creatinine 0.44 - 1.00 mg/dL 0.51  Sodium 135 - 145 mmol/L 135  Potassium 3.5 - 5.1 mmol/L 3.7  Chloride 98 - 111 mmol/L 102  CO2 22 - 32 mmol/L 25  Calcium 8.9 - 10.3 mg/dL 9.5  Total Protein 6.5 - 8.1 g/dL 6.9  Total Bilirubin 0.3 - 1.2 mg/dL 0.5  Alkaline Phos 38 - 126 U/L 141(H)  AST 15 - 41 U/L 16  ALT 0 - 44 U/L 14   Edinburgh Score: Edinburgh Postnatal Depression Scale Screening Tool 05/08/2021  I have been able to laugh and see the funny side of things. 0  I have looked forward with enjoyment to things. 0  I have blamed myself unnecessarily when things went wrong. 0  I have been anxious or worried for no good reason. 2  I have felt scared  or panicky for no good reason. 0  Things have been getting on top of me. 1  I have been so unhappy that I have had difficulty sleeping. 0  I have felt sad or miserable. 0  I have been so unhappy that I have been crying. 0  The thought of harming myself has occurred to me. 0  Edinburgh Postnatal Depression Scale Total 3      After visit meds:  Allergies as of 05/10/2021       Reactions   Chloraseptic [benzocaine] Itching   CHG prep for c-section irriated skin and burned both CHG bath and wipes        Medication List     STOP taking these medications    HumaLOG KwikPen 100 UNIT/ML KwikPen Generic drug: insulin lispro   insulin aspart 100 UNIT/ML FlexPen Commonly known as: NOVOLOG       TAKE these medications    acetaminophen 500 MG tablet Commonly known  as: TYLENOL Take 1,000 mg by mouth every 6 (six) hours as needed (headache).   Dexcom G6 Transmitter Misc Use one transmitter every 90 days   docusate sodium 100 MG capsule Commonly known as: COLACE Take 100 mg by mouth 2 (two) times daily as needed for mild constipation.   FreeStyle Libre 2 Sensor Misc USE AS DIRECTED EVERY 14 DAYS   Dexcom G6 Sensor Misc Use one sensor every 10 days   FreeStyle Libre 2 Sensor Misc Apply 1 sensor every 14 days   hydrOXYzine 50 MG capsule Commonly known as: VISTARIL TAKE 1 CAPSULE BY MOUTH DAILY AS NEEDED FOR ANXIETY   ibuprofen 600 MG tablet Commonly known as: ADVIL Take 1 tablet (600 mg total) by mouth every 6 (six) hours as needed.   insulin glargine-yfgn 100 UNIT/ML Pen Commonly known as: Semglee (yfgn) Inject 20 Units into the skin at bedtime. What changed:  how much to take when to take this   Insulin Pen Needle 32G X 4 MM Misc Use as directed   Insulin Pen Needle 32G X 4 MM Misc Use as directed 4 times a day   Unifine Pentips 32G X 4 MM Misc Generic drug: Insulin Pen Needle Use as directed with insulin.  Patient using 4 pen needles per day   Insulin Pen Needle 32G X 4 MM Misc Use as directed with insulin up to 4 times a day   labetalol 200 MG tablet Commonly known as: NORMODYNE Take 1 tablet by mouth 2 times daily.   metFORMIN 500 MG 24 hr tablet Commonly known as: GLUCOPHAGE-XR Take 2 tablets by mouth twice a day   oxyCODONE 5 MG immediate release tablet Commonly known as: Oxy IR/ROXICODONE Take 1-2 tablets (5-10 mg total) by mouth every 4 (four) hours as needed for up to 7 days for moderate pain.   PRENATAL VITAMIN PO Take 1 tablet by mouth in the morning.               Discharge Care Instructions  (From admission, onward)           Start     Ordered   05/10/21 0000  Discharge wound care:       Comments: Wound vacuum removal on Wednesday in the office   05/10/21 1227              Discharge home in stable condition Infant Feeding: Breast Infant Disposition:home with mother Discharge instruction: per After Visit Summary and Postpartum booklet. Activity: Advance as tolerated. Pelvic rest for  6 weeks.  Diet: carb modified diet and low salt diet Anticipated Birth Control: Unsure Postpartum Appointment:6 weeks Additional Postpartum F/U: Incision check 5 days Future Appointments:No future appointments. Follow up Visit:      05/10/2021 Marvene Staff, MD

## 2021-05-10 NOTE — Discharge Instructions (Signed)
Call if temperature greater than equal to 100.4, nothing per vagina for 4-6 weeks or severe nausea vomiting, increased incisional pain , drainage or redness in the incision site, no straining with bowel movements, showers no bath °

## 2021-05-10 NOTE — Lactation Note (Signed)
This note was copied from a baby's chart. Lactation Consultation Note  Patient Name: Nicole Stuart Today's Date: 05/10/2021 Reason for consult: Follow-up assessment;Mother's request;Difficult latch;Early term 37-38.6wks;Infant weight loss;Maternal endocrine disorder Age:32 hours  LC assisted Mom latching infant at the breast following 5 ml appetizer of DBM before latching. Infant will latch in prone position with breast compression. Mom aware keep total feeding under 30 min. If infant not latching, Mom aware to offer more.   Plan 1. To feed based on cues 8-12 24 hr period. Mom to offer breasts and look for signs of milk transfer.  2. Mom to supplement with pace bottle feeding with Nfant nipple offering 18-25 ml if latching well. Mom aware to offer 30 ml or more if not latching well.  3. DEBP q 3 hrs for 15 min. Mom to give EBM first followed by formula as she transitions to Alimentum upon discharge 4. [facility] brochure of services provided. Mom to call to make follow up appt with outpatient lactation on Monday.  Maternal Data Has patient been taught Hand Expression?: Yes  Feeding Mother's Current Feeding Choice: Breast Milk and Donor Milk Nipple Type: Nfant Extra Slow Flow (gold)  LATCH Score Latch: Repeated attempts needed to sustain latch, nipple held in mouth throughout feeding, stimulation needed to elicit sucking reflex.  Audible Swallowing: A few with stimulation  Type of Nipple: Everted at rest and after stimulation (but will evert with stimulation. Infant latch in prone position with breast compression to offer more volume)  Comfort (Breast/Nipple): Soft / non-tender  Hold (Positioning): Assistance needed to correctly position infant at breast and maintain latch.  LATCH Score: 7   Lactation Tools Discussed/Used Tools: Pump;Flanges Flange Size: 21 Breast pump type: Double-Electric Breast Pump Pump Education: Setup, frequency, and cleaning;Milk Storage Reason for Pumping:  increase stimulation Pumping frequency: every 3 hrs for 15 min  Interventions Interventions: Breast feeding basics reviewed;Breast compression;Assisted with latch;Adjust position;Skin to skin;Support pillows;DEBP;Breast massage;Position options;Hand express;Expressed milk;Education;Pace feeding  Discharge Discharge Education: Engorgement and breast care;Warning signs for feeding baby Pump: Employee Pump  Consult Status Consult Status: Complete Date: 05/10/21    Nicole Stuart  Nicole Stuart 05/10/2021, 2:47 PM

## 2021-05-13 ENCOUNTER — Telehealth: Payer: Self-pay | Admitting: Cardiovascular Disease

## 2021-05-13 NOTE — Telephone Encounter (Signed)
Discussed with Dr Duke Salvia and will see patient in [facility] clinic Wednesday  Patient aware of date, time and location

## 2021-05-13 NOTE — Telephone Encounter (Signed)
Pt c/o BP issue: STAT if pt c/o blurred vision, one-sided weakness or slurred speech  1. What are your last 5 BP readings? 160/100 HR 90?  2. Are you having any other symptoms (ex. Dizziness, headache, blurred vision, passed out)? Headache, swelling to feet and ankles  3. What is your BP issue? Pt recently had a baby, she's been home for 1 week and her BP have been high since she's been home Pt c/o swelling: STAT is pt has developed SOB within 24 hours  How much weight have you gained and in what time span? Pt have not weighed herself  If swelling, where is the swelling located? Bilateral feet/legs  Are you currently taking a fluid pill? yes  Are you currently SOB? No  Do you have a log of your daily weights (if so, list)? NO  Have you gained 3 pounds in a day or 5 pounds in a week? Pt does not weight herself  Have you traveled recently? NO

## 2021-05-13 NOTE — Telephone Encounter (Signed)
Pt called to report that she had a C Section on 05/08/21 and D/C from the hosp on 05/10/21... she says that her BP in the hops was doing well but since this past Saturday the day after D/C her BP went up to 160/100 HR 70's and she developed bilateral 2+ pitting edema in her lower extremities that goes half way up both her calves.   She says she does not have much SOB, she does not weigh herself, and she has had a headache.   Her BP now is 174/103 with HR 75.   She is only taking the Labetalol 200 mg BID.   She says she is very anxious. I have advised her to try and relax and hopefully then her BP will start to improve.   She is watching her NA intake. She has been increasing her fluids to help her milk come in since she is nursing her baby.   I will forward to Dr. Duke Salvia for review. Pt to also call her OBGYN. She will continue to monitor and call back if her symptoms worsen.

## 2021-05-14 NOTE — Progress Notes (Signed)
Advanced Hypertension Clinic Follow-up:    Date:  05/15/2021   ID:  Nicole Stuart, DOB [birth year], MRN [id number]  PCP:  Nicole Iba, NP  Cardiologist:  None  Congenital cardiologist: Dr. Lyndel Safe, [facility]  Referring MD: Nicole Iba, NP   CC: Hypertension  History of Present Illness:    Nicole Stuart is a 30 y.o. female [redacted] weeks pregnant with a hx of PDA, hypertension, diabetes, sickle cell trait, and PCOS here for follow-up. She established care in the advanced hypertension clinic 11/02/2020. She was seen by Dr. Cherly Hensen on 3/4 for confirmation of pregnancy.  She was started on nifedipine 30mg  and her spironolactone and HCTZ were disconitnued.   She was first diagnosed with hypertension in 2020.  It occurred during pregnancy.  The pregnancy was complicated by preeclampsia and gestational diabetes.  After delivery both hypertension and diabetes persisted.  She notes that initially in the pregnancy her blood pressure was low but it became increasingly elevated.  She ultimately delivered via cesarean section at 36 weeks.  Her OB/GYN noted that she had a murmur.  She reported that her murmur was longstanding since childhood.   She had an echo in 2020 that revealed LVEF 60 to 65% with normal right ventricular function.  A PDA was noted.  She saw pediatric cardiology at [facility] and given that she was asymptomatic with normal left ventricular function and no evidence of chamber dilation, she was thought to be low risk for vaginal delivery.  Her PDA was thought to be too small and it was unnecessary to close.  Given her low SVR they thought it was unlikely that she would go into heart failure during her pregnancy and recommended annual follow-up.  They did not feel that she needed SBE prophylaxis.  She is also previously been seen by [facility] cardiology for chest pain and had stress test that was unremarkable.  They have also been assisting with her blood pressure management.  At her last  appointment, Nicole Stuart noted increased heart rates up to the 130s with minimal exertion. She was also feeling unwell since switching antihypertensives. Nifedapine was stopped and she was switched to labetalol. She delivered her baby via cesarean section 04/2021.  Today, she reports that labetalol helped improve her palpitations and LE edema significantly during her pregnancy. However, after her delivery 9/21 her blood pressure spiked and her peripheral edema recurred. Since then she continues to struggle with this. At home her blood pressure is averaging in the 170s/100s. She has also been suffering from associated headaches. When she picks up her child, she becomes fatigued easily and notes that "her heart feels drained". Her sleep quality has been generally poor, but even with one restful night she felt significantly better. She denies any palpitations, chest pain, or shortness of breath. No lightheadedness, syncope, orthopnea, or PND.   Previous antihypertensives: HCTZ Spironolactone Losartan   Past Medical History:  Diagnosis Date   Diabetes mellitus type 2 in obese (HCC) 11/02/2020   Dysrhythmia    first degree heart block.    Essential hypertension 11/02/2020   Gestational diabetes    Heart murmur    Hypertension    Patent ductus arteriosus    Sleep apnea    study done two weeks ago [place]     Past Surgical History:  Procedure Laterality Date   CESAREAN SECTION     CESAREAN SECTION N/A 05/08/2021   Procedure: CESAREAN SECTION;  Surgeon: 05/10/2021, MD;  Location: [facility]  LD ORS;  Service: Obstetrics;  Laterality: N/A;   NASAL SEPTOPLASTY W/ TURBINOPLASTY  09/22/2011   Procedure: NASAL SEPTOPLASTY WITH TURBINATE REDUCTION;  Surgeon: Drema Halon, MD;  Location: [facility] OR;  Service: ENT;  Laterality: Bilateral;   TONSILLECTOMY  09/22/2011   Procedure: TONSILLECTOMY;  Surgeon: Drema Halon, MD;  Location: [facility] OR;  Service: ENT;  Laterality: Bilateral;    Current  Medications: No outpatient medications have been marked as taking for the 05/15/21 encounter (Office Visit) with Chilton Si, MD.     Allergies:   Chloraseptic [benzocaine]   Social History   Socioeconomic History   Marital status: Married    Spouse name: Not on file   Number of children: Not on file   Years of education: Not on file   Highest education level: Not on file  Occupational History   Not on file  Tobacco Use   Smoking status: Never   Smokeless tobacco: Never  Vaping Use   Vaping Use: Never used  Substance and Sexual Activity   Alcohol use: No   Drug use: No   Sexual activity: Yes  Other Topics Concern   Not on file  Social History Narrative   Pt is L&D RN at [employer]   Social Determinants of Health   Financial Resource Strain: Not on file  Food Insecurity: No Food Insecurity   Worried About Programme researcher, broadcasting/film/video in the Last Year: Never true   Ran Out of Food in the Last Year: Never true  Transportation Needs: No Transportation Needs   Lack of Transportation (Medical): No   Lack of Transportation (Non-Medical): No  Physical Activity: Insufficiently Active   Days of Exercise per Week: 2 days   Minutes of Exercise per Session: 60 min  Stress: No Stress Concern Present   Feeling of Stress : Not at all  Social Connections: Not on file     Family History: The patient's family history includes Diabetes in her mother; Hypertension in her brother and sister; Obesity in her mother. She was adopted.  ROS:   Please see the history of present illness. (+) Peripheral edema (+) Fatigue (+) Headaches    All other systems reviewed and are negative.  EKGs/Labs/Other Studies Reviewed:    EKG:   05/15/2021: Sinus rhythm. Rate 74 bpm. 11/02/2020: sinus rhythm/sinus arrhythmia.  Rate 86 bpm.  Nuclear Exercise Stress Test 06/04/2020: Exercise Sestamibi stress test: Exercise nuclear stress test was performed using Bruce protocol. Patient reached 7 METS, and 85% of  age predicted maximum heart rate. Exercise capacity was low Chest pain not reported. Normal heart rate response, hypertensive blood pressure response with peak BP 204/84 mmHg. Stress EKG revealed no ischemic changes. Normal myocardial perfusion. Stress LVEF 58%. Low risk study.   Echo 02/23/19 [facility]): Summary  There is no prior echocardiogram for comparison. Subcostal images not obtained  due to pregnancy. A patent ductus arteriousus is suspected.  Tricuspid valve is structurally normal.  No evidence of tricuspid stenosis.  Trace tricuspid regurgitation.  Pulmonic valve is structurally normal.  No evidence of pulmonic stenosis.  Trace pulmonic regurgitation.  Normal left ventricular size and systolic function with no appreciable  segmental abnormality.  Ejection fraction is visually estimated at 60-65%.  Diastolic function is normal.  Normal left ventricular wall thickness.  Normal size right atrium.  Normal right ventricular size and function.  The aortic root diameter is within normal limits.  The IVC was not visualized ([redacted] weeks gestation).  A patent ductus arteriosus  is suspected.  The interatrial septum appears intact by color flow Doppler.    Recent Labs: 06/22/2020: Magnesium 1.8 03/23/2021: ALT 14; BUN 5; Creatinine, Ser 0.51; Potassium 3.7; Sodium 135 05/09/2021: Hemoglobin 9.2; Platelets 281   Recent Lipid Panel No results found for: CHOL, TRIG, HDL, CHOLHDL, VLDL, LDLCALC, LDLDIRECT  Physical Exam:   VS:  BP (!) 171/107 (BP Location: Right Arm, Patient Position: Sitting, Cuff Size: Normal)   Pulse 74   Ht 5\' 1"  (1.549 m)   Wt 207 lb 3.2 oz (94 kg)   LMP 08/15/2020 (Approximate)   BMI 39.15 kg/m  , BMI Body mass index is 39.15 kg/m. GENERAL:  Well appearing HEENT: Pupils equal round and reactive, fundi not visualized, oral mucosa unremarkable NECK:  No jugular venous distention, waveform within normal limits, carotid upstroke brisk and symmetric, no bruits LUNGS:   Clear to auscultation bilaterally HEART:  RRR.  PMI not displaced or sustained,S1 and S2 within normal limits, no S3, no S4, no clicks, no rubs, no murmur ABD:  Positive bowel sounds normal in frequency in pitch, no bruits, no rebound, no guarding, no midline pulsatile mass, no hepatomegaly, no splenomegaly EXT:  2 plus pulses throughout, trace bilateral LE edema, no cyanosis no clubbing SKIN:  No rashes no nodules NEURO:  Cranial nerves II through XII grossly intact, motor grossly intact throughout PSYCH:  Cognitively intact, oriented to person place and time   ASSESSMENT:    1. Essential hypertension   2. Patent ductus arteriosus   3. Obesity (BMI 30-39.9)      PLAN:   Essential hypertension BP is too high post-partum.  We will start back her spironolactone 25 mg daily.  We will also increase labetalol to 300 mg twice a day.  She notes that she had similar issues after delivery of her last child.  Given her fatigue and swelling we will also get an echocardiogram.  She is due to get an echo anyways to evaluate her PDA.  Patent ductus arteriosus Check an echocardiogram.  Obesity (BMI 30-39.9) She just delivered last week.  When she is further postpartum we will consider the PREP exercise program to the [facility].    Disposition: FU with PharmD in 1 month. FU with Tajon Moring C. [facility], MD, [facility] in 4 months.   Medication Adjustments/Labs and Tests Ordered: Current medicines are reviewed at length with the patient today.  Concerns regarding medicines are outlined above.   No orders of the defined types were placed in this encounter.  No orders of the defined types were placed in this encounter.  I,Mathew Stumpf,acting as a [facility] for Neurosurgeon, MD.,have documented all relevant documentation on the behalf of Chilton Si, MD,as directed by  Chilton Si, MD while in the presence of Chilton Si, MD.  I, Jevante Hollibaugh C. Chilton Si, MD have reviewed all documentation for  this visit.  The documentation of the exam, diagnosis, procedures, and orders on 05/15/2021 are all accurate and complete.   05/17/2021  05/15/2021 9:22 AM    [facility]

## 2021-05-15 ENCOUNTER — Encounter (HOSPITAL_BASED_OUTPATIENT_CLINIC_OR_DEPARTMENT_OTHER): Payer: Self-pay | Admitting: Cardiovascular Disease

## 2021-05-15 ENCOUNTER — Ambulatory Visit (HOSPITAL_BASED_OUTPATIENT_CLINIC_OR_DEPARTMENT_OTHER): Payer: 59 | Admitting: Cardiovascular Disease

## 2021-05-15 ENCOUNTER — Encounter: Payer: Self-pay | Admitting: *Deleted

## 2021-05-15 ENCOUNTER — Other Ambulatory Visit: Payer: Self-pay

## 2021-05-15 ENCOUNTER — Other Ambulatory Visit (HOSPITAL_COMMUNITY): Payer: Self-pay

## 2021-05-15 VITALS — BP 171/107 | HR 74 | Ht 61.0 in | Wt 207.2 lb

## 2021-05-15 DIAGNOSIS — Q250 Patent ductus arteriosus: Secondary | ICD-10-CM | POA: Diagnosis not present

## 2021-05-15 DIAGNOSIS — I10 Essential (primary) hypertension: Secondary | ICD-10-CM | POA: Diagnosis not present

## 2021-05-15 DIAGNOSIS — Z006 Encounter for examination for normal comparison and control in clinical research program: Secondary | ICD-10-CM

## 2021-05-15 DIAGNOSIS — Z5181 Encounter for therapeutic drug level monitoring: Secondary | ICD-10-CM

## 2021-05-15 DIAGNOSIS — E669 Obesity, unspecified: Secondary | ICD-10-CM

## 2021-05-15 DIAGNOSIS — O1003 Pre-existing essential hypertension complicating the puerperium: Secondary | ICD-10-CM | POA: Diagnosis not present

## 2021-05-15 MED ORDER — LABETALOL HCL 300 MG PO TABS
300.0000 mg | ORAL_TABLET | Freq: Two times a day (BID) | ORAL | 3 refills | Status: DC
  Filled 2021-05-15: qty 180, 90d supply, fill #0
  Filled 2021-08-13: qty 180, 90d supply, fill #1
  Filled 2022-04-15: qty 180, 90d supply, fill #2

## 2021-05-15 MED ORDER — SPIRONOLACTONE 25 MG PO TABS
25.0000 mg | ORAL_TABLET | Freq: Every day | ORAL | 3 refills | Status: DC
  Filled 2021-05-15: qty 90, 90d supply, fill #0
  Filled 2021-08-13: qty 90, 90d supply, fill #1
  Filled 2022-04-15: qty 90, 90d supply, fill #2

## 2021-05-15 NOTE — Patient Instructions (Addendum)
Medication Instructions:  INCREASE YOUR LABETALOL TO 300 MG TWICE A DAY   START SPIRONOLACTONE 25 MG DAILY    Labwork: BMET IN 1 WEEK    Testing/Procedures: Your physician has requested that you have an echocardiogram. Echocardiography is a painless test that uses sound waves to create images of your heart. It provides your doctor with information about the size and shape of your heart and how well your heart's chambers and valves are working. This procedure takes approximately one hour. There are no restrictions for this procedure.   Follow-Up: 07/16/2021 1:30 PM WITH PHARM D AT [facility] LOCATION  09/17/2021 1:30 PM WITH DR Piney AT [facility] LOCATION    Special Instructions:   MONITOR YOU BLOOD PRESSURE WITH MACHINE PROVIDED MAKE SURE YOU HAVE [employer] APP ON WHEN TAKING BLOOD PRESSURE

## 2021-05-15 NOTE — Assessment & Plan Note (Addendum)
BP is too high post-partum.  She has a history of pre-eclampsia.  We will start back her spironolactone 25 mg daily.  We will also increase labetalol to 300 mg twice a day.  She notes that she had similar issues after delivery of her last child.  Given her fatigue and swelling we will also get an echocardiogram.  She is due to get an echo anyways to evaluate her PDA.  She is interested in enrolling in our remote patient monitoring study and consents to monitoring through the [employer] system.

## 2021-05-15 NOTE — Assessment & Plan Note (Signed)
Check an echocardiogram.

## 2021-05-15 NOTE — Assessment & Plan Note (Signed)
She just delivered last week.  When she is further postpartum we will consider the PREP exercise program to the [facility].

## 2021-05-15 NOTE — Research (Signed)
Nicole Stuart met criteria for the Virtual Care and social determinant interventions for the management of hypertension trial. The trial was discussed with her by Dr. Oval Linsey and myself. She had the opportunity to read the consent and ask questions. The consent was signed and she was given a copy of the consent. No study related procedures were performed prior to signing the consent. She was randomized to Group 2: Advanced HTN clinic with remote patient monitoring.

## 2021-05-15 NOTE — Addendum Note (Signed)
Addended by: Barrie Dunker on: 05/15/2021 02:15 PM   Modules accepted: Orders

## 2021-05-16 ENCOUNTER — Other Ambulatory Visit (HOSPITAL_COMMUNITY): Payer: Self-pay

## 2021-05-17 ENCOUNTER — Encounter (HOSPITAL_BASED_OUTPATIENT_CLINIC_OR_DEPARTMENT_OTHER): Payer: Self-pay

## 2021-05-20 ENCOUNTER — Other Ambulatory Visit: Payer: Self-pay | Admitting: Cardiovascular Disease

## 2021-05-20 ENCOUNTER — Encounter (HOSPITAL_BASED_OUTPATIENT_CLINIC_OR_DEPARTMENT_OTHER): Payer: Self-pay

## 2021-05-20 ENCOUNTER — Telehealth (HOSPITAL_COMMUNITY): Payer: Self-pay | Admitting: *Deleted

## 2021-05-20 ENCOUNTER — Other Ambulatory Visit (HOSPITAL_COMMUNITY): Payer: Self-pay

## 2021-05-20 ENCOUNTER — Telehealth: Payer: Self-pay

## 2021-05-20 DIAGNOSIS — Z0000 Encounter for general adult medical examination without abnormal findings: Secondary | ICD-10-CM

## 2021-05-20 MED ORDER — AMLODIPINE BESYLATE 5 MG PO TABS
5.0000 mg | ORAL_TABLET | Freq: Every day | ORAL | 3 refills | Status: DC
  Filled 2021-05-20: qty 90, 90d supply, fill #0

## 2021-05-20 NOTE — Progress Notes (Signed)
Notified by Care Guide that patient's BP is still elevated and she is having headaches.  Called and spoke with patient.  She is taking her medication as prescribed.  She saw her OB/GYN and lab testing was negative for preeclampsia.  We will add amlodipine 5 mg daily.  Continue spironolactone.  For today and tomorrow she will increase her labetalol to 400 mg twice daily then go back to 300 mg twice daily on 10/5.  Continue to monitor in the remote patient monitoring system.  Gabreal Worton C. Duke Salvia, MD, [facility] 05/20/2021 11:07 AM

## 2021-05-20 NOTE — Telephone Encounter (Signed)
Dr Duke Salvia spoke with patent and added Amlodipine 5 mg daily

## 2021-05-20 NOTE — Telephone Encounter (Signed)
Patient reported that she has continued to have elevated blood pressure and headaches. Was referred by OB to a cardiologist. Is taking meds as prescribed, per her report. Patient is knowledgeable of signs and symptoms to report to MD. Patient voiced no other questions or concerns regarding her own health. EPDS = 3. Patient reported that infant was born at [redacted]weeks gestation and has been losing weight. Reported keeping multiple appointments with pediatrician for weight checks. Also reported working with a Advertising copywriter in the peds office. Patient stated that infant was back up to birth weight today.Patient voiced no other questions or concerns regarding baby at this time. Patient reported infant sleeps in a bassinet on his back. RN reviewed ABCs of safe sleep - patient verbalized understanding. Patient requested RN email information on hospital's virtual postpartum classes and support groups. Email sent. Deforest Hoyles, RN, 05/20/21, [phone].

## 2021-05-20 NOTE — Telephone Encounter (Signed)
Called patient to discuss any issues or concerns she may have with [employer] and the results from her survey responses.   Patient indicated that she has not slept well due to having a newborn. Offered health coaching to patient to help create a routine that will enable her the ability to get more sleep. Patient stated that she is currently working with her lactation nurse on this step.  Patient is not able to exercise at this time due to having a c-section one week ago. Patient is concerned about her elevated bp, which is making her head hurt.   Patient stated that she has been in contact with her OB/GYN and it was ruled out that it was post pregnancy related.   Patient was provided contact information to reach out if she has any additional questions or concerns.   Care Guide will reach out to Dr. Duke Salvia and Juliette Alcide regarding patient's concern for further follow up.   Yitzhak Awan Nedra Hai, [facility] Guide, Health Coach [address] [place] [address] Telephone: [phone] Email: [email or web address]

## 2021-05-21 ENCOUNTER — Other Ambulatory Visit (HOSPITAL_COMMUNITY): Payer: Self-pay

## 2021-05-23 ENCOUNTER — Other Ambulatory Visit (HOSPITAL_BASED_OUTPATIENT_CLINIC_OR_DEPARTMENT_OTHER): Payer: 59

## 2021-05-23 ENCOUNTER — Telehealth: Payer: Self-pay | Admitting: *Deleted

## 2021-05-23 ENCOUNTER — Other Ambulatory Visit (HOSPITAL_COMMUNITY): Payer: Self-pay

## 2021-05-23 DIAGNOSIS — Z5181 Encounter for therapeutic drug level monitoring: Secondary | ICD-10-CM | POA: Diagnosis not present

## 2021-05-23 DIAGNOSIS — I10 Essential (primary) hypertension: Secondary | ICD-10-CM | POA: Diagnosis not present

## 2021-05-23 MED ORDER — AMLODIPINE BESYLATE 10 MG PO TABS
10.0000 mg | ORAL_TABLET | Freq: Every day | ORAL | 3 refills | Status: DC
  Filled 2021-05-23 – 2021-08-13 (×2): qty 90, 90d supply, fill #0
  Filled 2022-04-15: qty 90, 90d supply, fill #1

## 2021-05-23 NOTE — Telephone Encounter (Signed)
-----   Message from Tiffany [place], MD sent at 05/22/2021  4:03 PM EDT ----- Please increase amlodipine to 10mg.  Can you please call her and update pharmacy?  Thanks, Tiffany  

## 2021-05-23 NOTE — Telephone Encounter (Signed)
-----   Message from Chilton Si, MD sent at 05/22/2021  4:03 PM EDT ----- Please increase amlodipine to 10mg .  Can you please call her and update pharmacy?  Thanks, 

## 2021-05-23 NOTE — Telephone Encounter (Signed)
Advised patient, verbalized understanding Rx sent to pharmacy  

## 2021-05-24 LAB — BASIC METABOLIC PANEL
BUN/Creatinine Ratio: 20 (ref 9–23)
BUN: 11 mg/dL (ref 6–20)
CO2: 23 mmol/L (ref 20–29)
Calcium: 9.6 mg/dL (ref 8.7–10.2)
Chloride: 103 mmol/L (ref 96–106)
Creatinine, Ser: 0.56 mg/dL — ABNORMAL LOW (ref 0.57–1.00)
Glucose: 78 mg/dL (ref 70–99)
Potassium: 4.4 mmol/L (ref 3.5–5.2)
Sodium: 141 mmol/L (ref 134–144)
eGFR: 124 mL/min/{1.73_m2} (ref >59)

## 2021-05-31 ENCOUNTER — Other Ambulatory Visit (HOSPITAL_COMMUNITY): Payer: Self-pay

## 2021-06-13 ENCOUNTER — Other Ambulatory Visit (HOSPITAL_COMMUNITY): Payer: Self-pay

## 2021-06-14 DIAGNOSIS — I10 Essential (primary) hypertension: Secondary | ICD-10-CM | POA: Diagnosis not present

## 2021-06-26 DIAGNOSIS — O24319 Unspecified pre-existing diabetes mellitus in pregnancy, unspecified trimester: Secondary | ICD-10-CM | POA: Diagnosis not present

## 2021-07-01 ENCOUNTER — Ambulatory Visit (INDEPENDENT_AMBULATORY_CARE_PROVIDER_SITE_OTHER): Payer: 59

## 2021-07-01 ENCOUNTER — Other Ambulatory Visit: Payer: Self-pay

## 2021-07-01 DIAGNOSIS — I10 Essential (primary) hypertension: Secondary | ICD-10-CM | POA: Diagnosis not present

## 2021-07-01 DIAGNOSIS — Q250 Patent ductus arteriosus: Secondary | ICD-10-CM | POA: Diagnosis not present

## 2021-07-01 LAB — ECHOCARDIOGRAM COMPLETE
AR max vel: 1.5 cm2
AV Area VTI: 1.39 cm2
AV Area mean vel: 1.56 cm2
AV Mean grad: 7 mmHg
AV Peak grad: 15.1 mmHg
Ao pk vel: 1.95 m/s
Area-P 1/2: 2.96 cm2
Calc EF: 67.2 %
S' Lateral: 3.17 cm
Single Plane A2C EF: 71.7 %
Single Plane A4C EF: 60.8 %

## 2021-07-08 ENCOUNTER — Encounter (HOSPITAL_BASED_OUTPATIENT_CLINIC_OR_DEPARTMENT_OTHER): Payer: Self-pay | Admitting: Cardiovascular Disease

## 2021-07-10 ENCOUNTER — Other Ambulatory Visit: Payer: Self-pay

## 2021-07-10 ENCOUNTER — Encounter (HOSPITAL_BASED_OUTPATIENT_CLINIC_OR_DEPARTMENT_OTHER): Payer: Self-pay | Admitting: General Practice

## 2021-07-10 ENCOUNTER — Ambulatory Visit (HOSPITAL_BASED_OUTPATIENT_CLINIC_OR_DEPARTMENT_OTHER): Payer: 59 | Admitting: General Practice

## 2021-07-10 VITALS — BP 132/80 | HR 82 | Wt 195.0 lb

## 2021-07-10 DIAGNOSIS — E669 Obesity, unspecified: Secondary | ICD-10-CM | POA: Diagnosis not present

## 2021-07-10 DIAGNOSIS — I10 Essential (primary) hypertension: Secondary | ICD-10-CM

## 2021-07-10 DIAGNOSIS — Q250 Patent ductus arteriosus: Secondary | ICD-10-CM

## 2021-07-10 NOTE — Progress Notes (Signed)
Hypertension Clinic Initial Assessment:    Date:  07/10/2021   ID:  Loving, DOB Sep 03, 1990, MRN [id number]  PCP:  Enzo Bi, NP  Cardiologist:  Skeet Latch, MD   Referring MD: Enzo Bi, NP   CC: Hypertension  History of Present Illness:    Evana Holey Herda is a 30 y.o. female with a hx of PDA, HTN, diabetes, sickle cell trait, and PCOS.  She establish care with the advanced hypertension clinic 11/02/2020.  She was seen by Dr. Garwin Brothers 3/4 for confirmation of pregnancy.  She was started on nifedipine 30 mg and her spironolactone and HCTZ were discontinued.  She was first diagnosed with hypertension in 2020.  Her hypertension occurred during pregnancy.  Her pregnancy was complicated by preeclampsia and gestational diabetes.  After delivery both hypertension and her diabetes persisted.  She noted that initially in her pregnancy her blood pressure was low but increased.  She delivered via C-section at 36 weeks.  Her OB/GYN noted a cardiac murmur.  She reported that her murmur has been present since childhood.  Her echocardiogram 2020 showed an LVEF of 60-65% with normal RV function.  A PDA was noted.  She saw a pediatric cardiologist at [facility] and due to her asymptomatic nature and normal EF with no evidence of chamber dilation she was felt to be low risk for vaginal delivery.  Her PDA was felt to be small and unnecessary for closure.  Annual follow-up was recommended.  They did not feel she needed SBE prophylaxis.  She was also previously seen by [facility] cardiology for chest pain and her stress test was unremarkable.  They were also assisting her with her management of her blood pressure.  She was seen by Dr. Oval Linsey 05/15/2021.  During that time her heart rate was climbing to the 130s with minimal exertion.  She was feeling unwell after switching her antihypertensive medication.  Nifedipine was stopped and she was switched to labetalol.  She noted improvement in her blood  pressure and palpitations with labetalol as well as her lower extremity swelling.  However, after delivery 9/21 her blood pressure spiked and her peripheral edema recurred.  Since that time she continued to struggle with her blood pressure.  At home her blood pressure was averaging in the 170s over 100s.  She was noted associated headaches.  She was fatigued with activity such as picking up her child.  She reported that she was feeling drained.  Her sleep quality has been poor.  She did note however though that with restful sleep she felt much better.  She denied palpitations, chest pain, shortness of breath, lightheadedness, presyncope and syncope.  She denied orthopnea and PND.  She spoke with Dr. Oval Linsey on 05/20/2021.  During that time she was noting elevated blood pressure and was having headaches.  She was taking her medication as prescribed.  She had seen her OB/GYN and was negative for preeclampsia.  Amlodipine 5 mg daily was added to her medication regimen.  She was continued on her spironolactone.  She was instructed to increase her labetalol to 400 mg twice daily and then go back to 300 mg twice daily on 05/22/2021.  She was also instructed to continue to monitor her blood pressure using her remote monitor system.  She presents the clinic today for follow-up evaluation and states she has noticed difficulty with trying to increase her physical activity.  She notes increased shortness of breath with increased physical activity.  We discussed  her activity level and weight prior to pregnancy.  Before pregnancy she reports she was about 194 pounds.  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  She reports that she did not have any trouble responding to codes or noted shortness of breath with the activity.  She did not have a formal exercise routine prior to becoming pregnant.  She was not physically active during her pregnancy and her weight increased to 213  pounds.  Her blood pressure today is 132/80.  She did not tolerate 10 mg of amlodipine and is now taking 5 mg again.  She reports that her blood pressure is well controlled at home.  We reviewed her echocardiogram and she expressed understanding.  We discussed the importance of heart healthy low-sodium high-fiber diet.  I have asked her to increase her physical activity slowly over the next month.  We will continue her current medications.  We will plan follow-up for 1 month.  Today she denies chest pain, increased shortness of breath, lower extremity edema, fatigue, palpitations, melena, hematuria, hemoptysis, diaphoresis, weakness, presyncope, syncope, orthopnea, and PND.   Previous antihypertensives: HCTZ Spironolactone Losartan  Past Medical History:  Diagnosis Date   Diabetes mellitus type 2 in obese (HCC) 11/02/2020   Dysrhythmia    first degree heart block.    Essential hypertension 11/02/2020   Gestational diabetes    Heart murmur    Hypertension    Patent ductus arteriosus    Sleep apnea    study done two weeks ago [place]     Past Surgical History:  Procedure Laterality Date   CESAREAN SECTION     CESAREAN SECTION N/A 05/08/2021   Procedure: CESAREAN SECTION;  Surgeon: Maxie Better, MD;  Location: [facility] ORS;  Service: Obstetrics;  Laterality: N/A;   NASAL SEPTOPLASTY W/ TURBINOPLASTY  09/22/2011   Procedure: NASAL SEPTOPLASTY WITH TURBINATE REDUCTION;  Surgeon: Drema Halon, MD;  Location: [facility] OR;  Service: ENT;  Laterality: Bilateral;   TONSILLECTOMY  09/22/2011   Procedure: TONSILLECTOMY;  Surgeon: Drema Halon, MD;  Location: [facility] OR;  Service: ENT;  Laterality: Bilateral;    Current Medications: No outpatient medications have been marked as taking for the 07/10/21 encounter (Office Visit) with Ronney Asters, NP.     Allergies:   Chloraseptic [benzocaine]   Social History   Socioeconomic History   Marital status: Married    Spouse name:  Not on file   Number of children: Not on file   Years of education: Not on file   Highest education level: Not on file  Occupational History   Not on file  Tobacco Use   Smoking status: Never   Smokeless tobacco: Never  Vaping Use   Vaping Use: Never used  Substance and Sexual Activity   Alcohol use: No   Drug use: No   Sexual activity: Yes  Other Topics Concern   Not on file  Social History Narrative   Pt is L&D RN at [employer]   Social Determinants of Health   Financial Resource Strain: Not on file  Food Insecurity: No Food Insecurity   Worried About Programme researcher, broadcasting/film/video in the Last Year: Never true   Ran Out of Food in the Last Year: Never true  Transportation Needs: No Transportation Needs   Lack of Transportation (Medical): No   Lack of Transportation (Non-Medical): No  Physical Activity: Insufficiently Active   Days of Exercise per  Week: 2 days   Minutes of Exercise per Session: 60 min  Stress: No Stress Concern Present   Feeling of Stress : Not at all  Social Connections: Not on file     Family History: The patient's family history includes Diabetes in her mother; Hypertension in her brother and sister; Obesity in her mother. She was adopted.  ROS:   Please see the history of present illness.     All other systems reviewed and are negative.  EKGs/Labs/Other Studies Reviewed:    EKG:  EKG is  ordered today.  The ekg ordered today demonstrates normal sinus rhythm 82 bpm no ST or T wave deviation.  05/15/2021: Sinus rhythm. Rate 74 bpm. 11/02/2020: sinus rhythm/sinus arrhythmia.  Rate 86 bpm.   Nuclear Exercise Stress Test 06/04/2020: Exercise Sestamibi stress test: Exercise nuclear stress test was performed using Bruce protocol. Patient reached 7 METS, and 85% of age predicted maximum heart rate. Exercise capacity was low Chest pain not reported. Normal heart rate response, hypertensive blood pressure response with peak BP 204/84 mmHg. Stress EKG revealed no ischemic  changes. Normal myocardial perfusion. Stress LVEF 58%. Low risk study.    Echo 02/23/19 [facility]): Summary  There is no prior echocardiogram for comparison. Subcostal images not obtained  due to pregnancy. A patent ductus arteriousus is suspected.  Tricuspid valve is structurally normal.  No evidence of tricuspid stenosis.  Trace tricuspid regurgitation.  Pulmonic valve is structurally normal.  No evidence of pulmonic stenosis.  Trace pulmonic regurgitation.  Normal left ventricular size and systolic function with no appreciable  segmental abnormality.  Ejection fraction is visually estimated at 60-65%.  Diastolic function is normal.  Normal left ventricular wall thickness.  Normal size right atrium.  Normal right ventricular size and function.  The aortic root diameter is within normal limits.  The IVC was not visualized ([redacted] weeks gestation).  A patent ductus arteriosus is suspected.  The interatrial septum appears intact by color flow Doppler.    Echocardiogram 07/01/2021 IMPRESSIONS     1. Left ventricular ejection fraction, by estimation, is 55 to 60%. The  left ventricle has normal function. The left ventricle has no regional  wall motion abnormalities. Left ventricular diastolic parameters were  normal.   2. Right ventricular systolic function is normal. The right ventricular  size is normal.   3. The mitral valve is normal in structure. Trivial mitral valve  regurgitation.   4. The aortic valve is normal in structure. Aortic valve regurgitation is  not visualized.   5. Tubulent flow thorugh PA but no definitive PDA.   6. The inferior vena cava is normal in size with greater than 50%  respiratory variability, suggesting right atrial pressure of 3 mmHg.  Recent Labs: 03/23/2021: ALT 14 05/09/2021: Hemoglobin 9.2; Platelets 281 05/23/2021: BUN 11; Creatinine, Ser 0.56; Potassium 4.4; Sodium 141   Recent Lipid Panel No results found for: CHOL, TRIG, HDL, CHOLHDL, VLDL,  LDLCALC, LDLDIRECT  Physical Exam:    VS:  BP 132/80   Pulse 82   LMP 08/15/2020 (Approximate)     Wt Readings from Last 3 Encounters:  05/15/21 207 lb 3.2 oz (94 kg)  05/02/21 205 lb (93 kg)  03/23/21 202 lb 11.2 oz (91.9 kg)     GEN:  Well nourished, well developed in no acute distress HEENT: Normal NECK: No JVD; No carotid bruits LYMPHATICS: No lymphadenopathy CARDIAC: RRR, no murmurs, rubs, gallops RESPIRATORY:  Clear to auscultation without rales, wheezing or rhonchi  ABDOMEN:  Soft, non-tender, non-distended MUSCULOSKELETAL:  No edema; No deformity  SKIN: Warm and dry NEUROLOGIC:  Alert and oriented x 3 PSYCHIATRIC:  Normal affect     Assessment/PLAN:    Essential hypertension-BP today 132/80.  Has noted improvement in blood pressure with addition of amlodipine. Continue amlodipine 5 mg daily Continue spironolactone 25 mg daily Continue labetalol 300 mg twice daily Heart healthy low-sodium diet Maintain physical activity as tolerated  Patent ductus arteriosus-echocardiogram 07/01/2021 showed LVEF 0000000, normal diastolic parameters, trivial mitral valve regurgitation, turbulent flow through PA but no definitive PDA. No plans for further evaluation at this time.  Obesity-weight today 195 pounds.  Continues to try to increase physical activity and monitor her diet. Continue increase physical activity and more strict diet.  Dyspnea on exertion-notes increased dyspnea with increased physical activity.  We reviewed the importance of slowly increasing physical activity.  Echocardiogram shows normal LVEF, diastolic parameters and trivial mitral valve regurgitation.  Results reviewed.  Symptoms appear to be secondary to body habitus and decreased fitness. Increase physical activity slowly as tolerated Continue weight loss  Disposition:   Follow-up with me or Dr. Oval Linsey in 1 month.  Medication Adjustments/Labs and Tests Ordered: Current medicines are reviewed at length  with the patient today.  Concerns regarding medicines are outlined above.  No orders of the defined types were placed in this encounter.  No orders of the defined types were placed in this encounter.    Signed, Deberah Pelton, NP  07/10/2021 4:38 PM    [facility]

## 2021-07-10 NOTE — Patient Instructions (Signed)
Medication Instructions:  Your Physician recommend you continue on your current medication as directed.    *If you need a refill on your cardiac medications before your next appointment, please call your pharmacy*   Lab Work: None ordered today  Testing/Procedures: None ordered today    Follow-Up: At [facility], you and your health needs are our priority.  As part of our continuing mission to provide you with exceptional heart care, we have created designated Provider Care Teams.  These Care Teams include your primary Cardiologist (physician) and Advanced Practice Providers (APPs -  Physician Assistants and Nurse Practitioners) who all work together to provide you with the care you need, when you need it.  We recommend signing up for the patient portal called "MyChart".  Sign up information is provided on this After Visit Summary.  MyChart is used to connect with patients for Virtual Visits (Telemedicine).  Patients are able to view lab/test results, encounter notes, upcoming appointments, etc.  Non-urgent messages can be sent to your provider as well.   To learn more about what you can do with MyChart, go to [email or web address].    Your next appointment:   1 month(s)  The format for your next appointment:   In Person  Provider:   Edd Fabian, NP    Other Instructions- To start please walk 15-20 minutes per day, when this feels comfortable increase your activity by 3-4 minutes at a time.   American Heart Association Recommendations for Physical Activity in Adults  Are you fitting in at least 150 minutes (2.5 hours) of heart-pumping physical activity per week? If not, you're not alone. Only about one in five adults and teens get enough exercise to maintain good health. Being more active can help all people think, feel and sleep better and perform daily tasks more easily. And if you're sedentary, sitting less is a great place to start.  These recommendations are based on  the Physical Activity Guidelines for Americans, 2nd edition, published by the U.S. Department of Health and Health and safety inspector, Office of Disease Prevention and Health Promotion. They recommend how much physical activity we need to be healthy. The guidelines are based on current scientific evidence supporting the connections between physical activity, overall health and well-being, disease prevention and quality of life.  AHA Physical Activity Recommendations for Adults     Get at least 150 minutes per week of moderate-intensity aerobic activity or 75 minutes per week of vigorous aerobic activity, or a combination of both, preferably spread throughout the week.  Add moderate- to high-intensity muscle-strengthening activity (such as resistance or weights) on at least 2 days per week. Spend less time sitting. Even light-intensity activity can offset some of the risks of being sedentary.  Gain even more benefits by being active at least 300 minutes (5 hours) per week. Increase amount and intensity gradually over time.   What is intensity?  Physical activity is anything that moves your body and burns calories. This includes things like walking, climbing stairs and stretching.  Aerobic (or "cardio") activity gets your heart rate up and benefits your heart by improving cardiorespiratory fitness. When done at moderate intensity, your heart will beat faster and you'll breathe harder than normal, but you'll still be able to talk. Think of it as a medium or moderate amount of effort.  Examples of moderate-intensity aerobic activities:  brisk walking (at least 2.5 miles per hour) water aerobics dancing (ballroom or social) gardening tennis (doubles) biking slower than 10 miles per hour Vigorous  intensity activities will push your body a little further. They will require a higher amount of effort. You'll probably get warm and begin to sweat. You won't be able to talk much without getting out of  breath.  Examples of vigorous-intensity aerobic activities:  hiking uphill or with a heavy backpack running swimming laps aerobic dancing heavy yardwork like continuous digging or hoeing tennis (singles) cycling 10 miles per hour or faster jumping rope Knowing your target heart rate can also help you track the intensity of your activities.  For maximum benefits, include both moderate- and vigorous-intensity activity in your routine along with strengthening and stretching exercises.  What if I'm just starting to get active?  Don't worry if you can't reach 150 minutes per week just yet. Everyone has to start somewhere. Even if you've been sedentary for years, today is the day you can begin to make healthy changes in your life. Set a reachable goal for today. You can work up toward the recommended amount by increasing your time as you get stronger. Don't let all-or-nothing thinking keep you from doing what you can every day.  The simplest way to get moving and improve your health is to start walking. It's free, easy and can be done just about anywhere, even in place.  Any amount of movement is better than none. And you can break it up into short bouts of activity throughout the day. Taking a brisk walk for five or ten minutes a few times a day will add up.  If you have a chronic condition or disability, talk with your healthcare provider about what types and amounts of physical activity are right for you before making too many changes. But don't wait! Get started today by simply sitting less and moving more, whatever that looks like for you.  The takeaway:  Move more, with more intensity, and sit less. Science has linked being inactive and sitting too much with higher risk of heart disease, type 2 diabetes, colon and lung cancers, and early death.  It's clear that being more active benefits everyone and helps [place] live longer, healthier lives.  Here are some of the big wins:  Lower risk  of heart disease, stroke, type 2 diabetes, high blood pressure, dementia and Alzheimer's, several types of cancer, and some complications of pregnancy\  Better sleep, including improvements in insomnia and obstructive sleep apnea  Improved cognition, including memory, attention and processing speed  Less weight gain, obesity and related chronic health conditions  Better bone health and balance, with less risk of injury from falls  Fewer symptoms of depression and anxiety  Better quality of life and sense of overall well-being   Heart-Healthy Eating Plan Many factors influence your heart (coronary) health, including eating and exercise habits. Coronary risk increases with abnormal blood fat (lipid) levels. Heart-healthy meal planning includes limiting unhealthy fats, increasing healthy fats, and making other diet and lifestyle changes. What is my plan? Your health care provider may recommend that you: Limit your fat intake to _________% or less of your total calories each day. Limit your saturated fat intake to _________% or less of your total calories each day. Limit the amount of cholesterol in your diet to less than _________ mg per day. What are tips for following this plan? Cooking Cook foods using methods other than frying. Baking, boiling, grilling, and broiling are all good options. Other ways to reduce fat include: Removing the skin from poultry. Removing all visible fats from meats. Steaming vegetables in  water or broth. Meal planning  At meals, imagine dividing your plate into fourths: Fill one-half of your plate with vegetables and green salads. Fill one-fourth of your plate with whole grains. Fill one-fourth of your plate with lean protein foods. Eat 4-5 servings of vegetables per day. One serving equals 1 cup raw or cooked vegetable, or 2 cups raw leafy greens. Eat 4-5 servings of fruit per day. One serving equals 1 medium whole fruit,  cup dried fruit,  cup fresh,  frozen, or canned fruit, or  cup 100% fruit juice. Eat more foods that contain soluble fiber. Examples include apples, broccoli, carrots, beans, peas, and barley. Aim to get 25-30 g of fiber per day. Increase your consumption of legumes, nuts, and seeds to 4-5 servings per week. One serving of dried beans or legumes equals  cup cooked, 1 serving of nuts is  cup, and 1 serving of seeds equals 1 tablespoon. Fats Choose healthy fats more often. Choose monounsaturated and polyunsaturated fats, such as olive and canola oils, flaxseeds, walnuts, almonds, and seeds. Eat more omega-3 fats. Choose salmon, mackerel, sardines, tuna, flaxseed oil, and ground flaxseeds. Aim to eat fish at least 2 times each week. Check food labels carefully to identify foods with trans fats or high amounts of saturated fat. Limit saturated fats. These are found in animal products, such as meats, butter, and cream. Plant sources of saturated fats include palm oil, palm kernel oil, and coconut oil. Avoid foods with partially hydrogenated oils in them. These contain trans fats. Examples are stick margarine, some tub margarines, cookies, crackers, and other baked goods. Avoid fried foods. General information Eat more home-cooked food and less restaurant, buffet, and fast food. Limit or avoid alcohol. Limit foods that are high in starch and sugar. Lose weight if you are overweight. Losing just 5-10% of your body weight can help your overall health and prevent diseases such as diabetes and heart disease. Monitor your salt (sodium) intake, especially if you have high blood pressure. Talk with your health care provider about your sodium intake. Try to incorporate more vegetarian meals weekly. What foods can I eat? Fruits All fresh, canned (in natural juice), or frozen fruits. Vegetables Fresh or frozen vegetables (raw, steamed, roasted, or grilled). Green salads. Grains Most grains. Choose whole wheat and whole grains most of  the time. Rice and pasta, including brown rice and pastas made with whole wheat. Meats and other proteins Lean, well-trimmed beef, veal, pork, and lamb. Chicken and [place] without skin. All fish and shellfish. Wild duck, rabbit, pheasant, and venison. Egg whites or low-cholesterol egg substitutes. Dried beans, peas, lentils, and tofu. Seeds and most nuts. Dairy Low-fat or nonfat cheeses, including ricotta and mozzarella. Skim or 1% milk (liquid, powdered, or evaporated). Buttermilk made with low-fat milk. Nonfat or low-fat yogurt. Fats and oils Non-hydrogenated (trans-free) margarines. Vegetable oils, including soybean, sesame, sunflower, olive, peanut, safflower, corn, canola, and cottonseed. Salad dressings or mayonnaise made with a vegetable oil. Beverages Water (mineral or sparkling). Coffee and tea. Diet carbonated beverages. Sweets and desserts Sherbet, gelatin, and fruit ice. Small amounts of dark chocolate. Limit all sweets and desserts. Seasonings and condiments All seasonings and condiments. The items listed above may not be a complete list of foods and beverages you can eat. Contact a dietitian for more options. What foods are not recommended? Fruits Canned fruit in heavy syrup. Fruit in cream or butter sauce. Fried fruit. Limit coconut. Vegetables Vegetables cooked in cheese, cream, or butter sauce. Fried vegetables.  Grains Breads made with saturated or trans fats, oils, or whole milk. Croissants. Sweet rolls. Donuts. High-fat crackers, such as cheese crackers. Meats and other proteins Fatty meats, such as hot dogs, ribs, sausage, bacon, rib-eye roast or steak. High-fat deli meats, such as salami and bologna. Caviar. Domestic duck and goose. Organ meats, such as liver. Dairy Cream, sour cream, cream cheese, and creamed cottage cheese. Whole-milk cheeses. Whole or 2% milk (liquid, evaporated, or condensed). Whole buttermilk. Cream sauce or high-fat cheese sauce. Whole-milk  yogurt. Fats and oils Meat fat, or shortening. Cocoa butter, hydrogenated oils, palm oil, coconut oil, palm kernel oil. Solid fats and shortenings, including bacon fat, salt pork, lard, and butter. Nondairy cream substitutes. Salad dressings with cheese or sour cream. Beverages Regular sodas and any drinks with added sugar. Sweets and desserts Frosting. Pudding. Cookies. Cakes. Pies. Milk chocolate or white chocolate. Buttered syrups. Full-fat ice cream or ice cream drinks. The items listed above may not be a complete list of foods and beverages to avoid. Contact a dietitian for more information. Summary Heart-healthy meal planning includes limiting unhealthy fats, increasing healthy fats, and making other diet and lifestyle changes. Lose weight if you are overweight. Losing just 5-10% of your body weight can help your overall health and prevent diseases such as diabetes and heart disease. Focus on eating a balance of foods, including fruits and vegetables, low-fat or nonfat dairy, lean protein, nuts and legumes, whole grains, and heart-healthy oils and fats. This information is not intended to replace advice given to you by your health care provider. Make sure you discuss any questions you have with your health care provider. Document Revised: 12/13/2020 Document Reviewed: 12/13/2020 Elsevier Patient Education  2022 [employer].

## 2021-07-15 NOTE — Addendum Note (Signed)
Addended by: Barrie Dunker on: 07/15/2021 08:13 AM   Modules accepted: Orders

## 2021-07-16 ENCOUNTER — Ambulatory Visit: Payer: 59

## 2021-07-16 NOTE — Progress Notes (Deleted)
07/16/2021 Nicole Stuart 15-Oct-1990 [id number]   HPI:  Nicole Stuart is a 30 y.o. female patient of Dr ***, with a PMH below who presents today for hypertension clinic evaluation.  Past Medical History:                   Blood Pressure Goal:  130/80  Current Medications:  Amloidpine 10 mg, spironolactone 25 mg qd, labetalol 300 mg bid  Family Hx:  Social Hx:   Diet:   Exercise:   Home BP readings:   Intolerances:   Labs:    Wt Readings from Last 3 Encounters:  07/10/21 195 lb (88.5 kg)  05/15/21 207 lb 3.2 oz (94 kg)  05/02/21 205 lb (93 kg)   BP Readings from Last 3 Encounters:  07/10/21 132/80  05/15/21 (!) 171/107  05/10/21 134/80   Pulse Readings from Last 3 Encounters:  07/10/21 82  05/15/21 74  05/10/21 73    Current Outpatient Medications  Medication Sig Dispense Refill   acetaminophen (TYLENOL) 500 MG tablet Take 1,000 mg by mouth every 6 (six) hours as needed (headache).     amLODipine (NORVASC) 10 MG tablet Take 1 tablet (10 mg total) by mouth daily. 90 tablet 3   Continuous Blood Gluc Sensor (DEXCOM G6 SENSOR) MISC Use one sensor every 10 days 3 each 3   Continuous Blood Gluc Sensor (FREESTYLE LIBRE 2 SENSOR) MISC Apply 1 sensor every 14 days 2 each 11   Continuous Blood Gluc Transmit (DEXCOM G6 TRANSMITTER) MISC Use one transmitter every 90 days 1 each 3   docusate sodium (COLACE) 100 MG capsule Take 100 mg by mouth 2 (two) times daily as needed for mild constipation.     ibuprofen (ADVIL) 600 MG tablet Take 1 tablet by mouth every 6 hours as needed. 30 tablet 11   insulin glargine-yfgn (SEMGLEE, YFGN,) 100 UNIT/ML Pen Inject 20 Units into the skin at bedtime. 30 mL 11   Insulin Pen Needle (UNIFINE PENTIPS) 32G X 4 MM MISC Use as directed with insulin.  Patient using 4 pen needles per day 100 each 11   Insulin Pen Needle 32G X 4 MM MISC Use as directed with insulin up to 4 times a day 120 each 6   labetalol (NORMODYNE) 300 MG tablet Take  1 tablet (300 mg total) by mouth 2 (two) times daily. 180 tablet 3   metFORMIN (GLUCOPHAGE-XR) 500 MG 24 hr tablet Take 2 tablets by mouth twice a day 120 tablet 5   Prenatal Vit-Fe Fumarate-FA (PRENATAL VITAMIN PO) Take 1 tablet by mouth in the morning.     spironolactone (ALDACTONE) 25 MG tablet Take 1 tablet (25 mg total) by mouth daily. 90 tablet 3   No current facility-administered medications for this visit.    Allergies  Allergen Reactions   Chloraseptic [Benzocaine] Itching    CHG prep for c-section irriated skin and burned both CHG bath and wipes    Past Medical History:  Diagnosis Date   Diabetes mellitus type 2 in obese (HCC) 11/02/2020   Dysrhythmia    first degree heart block.    Essential hypertension 11/02/2020   Gestational diabetes    Heart murmur    Hypertension    Patent ductus arteriosus    Sleep apnea    study done two weeks ago [place]     Last menstrual period 08/15/2020, unknown if currently breastfeeding.  No problem-specific Assessment & Plan notes found for this encounter.  Tommy Medal PharmD CPP [facility] [address] [place], [place] [address] [phone]

## 2021-07-17 DIAGNOSIS — E1165 Type 2 diabetes mellitus with hyperglycemia: Secondary | ICD-10-CM | POA: Diagnosis not present

## 2021-07-29 NOTE — Progress Notes (Deleted)
Cardiology Office Note:    Date:  07/29/2021   ID:  Nicole Stuart, DOB [birth year], MRN [id number]  PCP:  Enzo Bi, NP   [facility] Providers Cardiologist:  Skeet Latch, MD { Click to update primary MD,subspecialty MD or APP then REFRESH:1}    Referring MD: Enzo Bi, NP   Follow-up for essential hypertension  History of Present Illness:    Nicole Stuart is a 30 y.o. female with a hx of of PDA, HTN, diabetes, sickle cell trait, and PCOS.  She establish care with the advanced hypertension clinic 11/02/2020.  She was seen by Dr. Garwin Brothers 3/4 for confirmation of pregnancy.  She was started on nifedipine 30 mg and her spironolactone and HCTZ were discontinued.  She was first diagnosed with hypertension in 2020.  Her hypertension occurred during pregnancy.  Her pregnancy was complicated by preeclampsia and gestational diabetes.  After delivery both hypertension and her diabetes persisted.  She noted that initially in her pregnancy her blood pressure was low but increased.  She delivered via C-section at 36 weeks.  Her OB/GYN noted a cardiac murmur.  She reported that her murmur has been present since childhood.  Her echocardiogram 2020 showed an LVEF of 60-65% with normal RV function.  A PDA was noted.  She saw a pediatric cardiologist at [facility] and due to her asymptomatic nature and normal EF with no evidence of chamber dilation she was felt to be low risk for vaginal delivery.  Her PDA was felt to be small and unnecessary for closure.  Annual follow-up was recommended.  They did not feel she needed SBE prophylaxis.  She was also previously seen by [facility] cardiology for chest pain and her stress test was unremarkable.  They were also assisting her with her management of her blood pressure.   She was seen by Dr. Oval Linsey 05/15/2021.  During that time her heart rate was climbing to the 130s with minimal exertion.  She was feeling unwell after switching her antihypertensive  medication.  Nifedipine was stopped and she was switched to labetalol.  She noted improvement in her blood pressure and palpitations with labetalol as well as her lower extremity swelling.  However, after delivery 9/21 her blood pressure spiked and her peripheral edema recurred.  Since that time she continued to struggle with her blood pressure.  At home her blood pressure was averaging in the 170s over 100s.  She was noted associated headaches.  She was fatigued with activity such as picking up her child.  She reported that she was feeling drained.  Her sleep quality has been poor.  She did note however though that with restful sleep she felt much better.  She denied palpitations, chest pain, shortness of breath, lightheadedness, presyncope and syncope.  She denied orthopnea and PND.   She spoke with Dr. Oval Linsey on 05/20/2021.  During that time she was noting elevated blood pressure and was having headaches.  She was taking her medication as prescribed.  She had seen her OB/GYN and was negative for preeclampsia.  Amlodipine 5 mg daily was added to her medication regimen.  She was continued on her spironolactone.  She was instructed to increase her labetalol to 400 mg twice daily and then go back to 300 mg twice daily on 05/22/2021.  She was also instructed to continue to monitor her blood pressure using her remote monitor system.   She presented to the clinic 07/10/2021 for follow-up evaluation and stated she had noticed  difficulty with trying to increase her physical activity.  She noted increased shortness of breath with increased physical activity.  We discussed her activity level and weight prior to pregnancy.  Before pregnancy she reported she was about 194 pounds.  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  She did not have a formal exercise routine  prior to becoming pregnant.  She was not physically active during her pregnancy and her weight increased to 213 pounds.  Her blood pressure was 132/80.  She did not tolerate 10 mg of amlodipine and was tolerating 5 mg well.  She reported that her blood pressure was well controlled at home.  We reviewed her echocardiogram and she expressed understanding.  We discussed the importance of heart healthy low-sodium high-fiber diet.  I  asked her to increase her physical activity slowly over the next month.  We continued her current medications.  Follow-up was planned for 1 month.  She presents to the clinic today for follow-up evaluation states***   Today she denies chest pain, increased shortness of breath, lower extremity edema, fatigue, palpitations, melena, hematuria, hemoptysis, diaphoresis, weakness, presyncope, syncope, orthopnea, and PND.     Previous antihypertensives: HCTZ Spironolactone Losartan  Past Medical History:  Diagnosis Date   Diabetes mellitus type 2 in obese ([place]) 11/02/2020   Dysrhythmia    first degree heart block.    Essential hypertension 11/02/2020   Gestational diabetes    Heart murmur    Hypertension    Patent ductus arteriosus    Sleep apnea    study done two weeks ago [place]     Past Surgical History:  Procedure Laterality Date   CESAREAN SECTION     CESAREAN SECTION N/A 05/08/2021   Procedure: CESAREAN SECTION;  Surgeon: Servando Salina, MD;  Location: [facility];  Service: Obstetrics;  Laterality: N/A;   NASAL SEPTOPLASTY W/ TURBINOPLASTY  09/22/2011   Procedure: NASAL SEPTOPLASTY WITH TURBINATE REDUCTION;  Surgeon: Rozetta Nunnery, MD;  Location: [facility];  Service: ENT;  Laterality: Bilateral;   TONSILLECTOMY  09/22/2011   Procedure: TONSILLECTOMY;  Surgeon: Rozetta Nunnery, MD;  Location: [facility] OR;  Service: ENT;  Laterality: Bilateral;    Current Medications: No outpatient medications have been marked as taking for the 07/31/21 encounter  (Appointment) with Deberah Pelton, NP.     Allergies:   Chloraseptic [benzocaine]   Social History   Socioeconomic History   Marital status: Married    Spouse name: Not on file   Number of children: Not on file   Years of education: Not on file   Highest education level: Not on file  Occupational History   Not on file  Tobacco Use   Smoking status: Never   Smokeless tobacco: Never  Vaping Use   Vaping Use: Never used  Substance and Sexual Activity   Alcohol use: No   Drug use: No   Sexual activity: Yes  Other Topics Concern   Not on file  Social History Narrative   Pt is L&D RN at [employer]   Social Determinants of Health   Financial Resource Strain: Not on file  Food Insecurity: No Food Insecurity   Worried About Charity fundraiser in the Last Year: Never true   [place] in the Last Year: Never true  Transportation Needs: No Regulatory affairs officer (Medical): No   Lack of Transportation (Non-Medical): No  Physical Activity: Insufficiently Active   Days of Exercise per Week: 2 days   Minutes of Exercise per Session: 60 min  Stress: No Stress Concern Present   Feeling of Stress : Not at all  Social Connections: Not on file     Family History: The patient's ***family history includes Diabetes in her mother; Hypertension in her brother and sister; Obesity in her mother. She was adopted.  ROS:   Please see the history of present illness.    *** All other systems reviewed and are negative.   Risk Assessment/Calculations:   {Does this patient have ATRIAL FIBRILLATION?:[phone]}       Physical Exam:    VS:  LMP 08/15/2020 (Approximate)     Wt Readings from Last 3 Encounters:  07/10/21 195 lb (88.5 kg)  05/15/21 207 lb 3.2 oz (94 kg)  05/02/21 205 lb (93 kg)     GEN: *** Well nourished, well developed in no acute distress HEENT: Normal NECK: No JVD; No carotid bruits LYMPHATICS: No lymphadenopathy CARDIAC: ***RRR, no  murmurs, rubs, gallops RESPIRATORY:  Clear to auscultation without rales, wheezing or rhonchi  ABDOMEN: Soft, non-tender, non-distended MUSCULOSKELETAL:  No edema; No deformity  SKIN: Warm and dry NEUROLOGIC:  Alert and oriented x 3 PSYCHIATRIC:  Normal affect    EKGs/Labs/Other Studies Reviewed:    The following studies were reviewed today:    Nuclear Exercise Stress Test 06/04/2020: Exercise Sestamibi stress test: Exercise nuclear stress test was performed using Bruce protocol. Patient reached 7 METS, and 85% of age predicted maximum heart rate. Exercise capacity was low Chest pain not reported. Normal heart rate response, hypertensive blood pressure response with peak BP 204/84 mmHg. Stress EKG revealed no ischemic changes. Normal myocardial perfusion. Stress LVEF 58%. Low risk study.    Echo 02/23/19 [facility]): Summary  There is no prior echocardiogram for comparison. Subcostal images not obtained  due to pregnancy. A patent ductus arteriousus is suspected.  Tricuspid valve is structurally normal.  No evidence of tricuspid stenosis.  Trace tricuspid regurgitation.  Pulmonic valve is structurally normal.  No evidence of pulmonic stenosis.  Trace pulmonic regurgitation.  Normal left ventricular size and systolic function with no appreciable  segmental abnormality.  Ejection fraction is visually estimated at 60-65%.  Diastolic function is normal.  Normal left ventricular wall thickness.  Normal size right atrium.  Normal right ventricular size and function.  The aortic root diameter is within normal limits.  The IVC was not visualized ([redacted] weeks gestation).  A patent ductus arteriosus is suspected.  The interatrial septum appears intact by color flow Doppler.    Echocardiogram 07/01/2021 IMPRESSIONS     1. Left ventricular ejection fraction, by estimation, is 55 to 60%. The  left ventricle has normal function. The left ventricle has no regional  wall motion abnormalities.  Left ventricular diastolic parameters were  normal.   2. Right ventricular systolic function is normal. The right ventricular  size is normal.   3. The mitral valve is normal in structure. Trivial mitral valve  regurgitation.   4. The aortic valve is normal in structure. Aortic valve regurgitation is  not visualized.   5. Tubulent flow thorugh PA but no definitive PDA.   6. The inferior vena cava is normal in size with greater than 50%  respiratory variability, suggesting right atrial pressure of 3 mmHg.  EKG:  EKG is ***  ordered today.  The ekg ordered today demonstrates   EKG 07/10/2021  normal sinus rhythm no ST or T wave deviation 82 bpm  Recent Labs: 03/23/2021: ALT 14 05/09/2021: Hemoglobin 9.2; Platelets 281 05/23/2021: BUN 11; Creatinine, Ser 0.56; Potassium 4.4; Sodium 141  Recent Lipid Panel No results found for: CHOL, TRIG, HDL, CHOLHDL, VLDL, LDLCALC, LDLDIRECT  ASSESSMENT & PLAN    Dyspnea on exertion-breathing has improved with initiation of exercise routine.  She is continuing to increase her physical activity.  We reviewed goal of 150 minutes of moderate physical activity per week.  Reviewed her previous echocardiogram again which shows normal LVEF, diastolic parameters and trivial mitral valve regurgitation.  Results reviewed.   Continue increased physical activity Continue weight loss  Essential hypertension-BP today ***132/80.  Has noted improvement in blood pressure with addition of amlodipine. Continue amlodipine 5 mg daily Continue spironolactone 25 mg daily Continue labetalol 300 mg twice daily Heart healthy low-sodium diet Maintain physical activity as tolerated Maintain blood pressure log Order BMP, CBC  Obesity-weight today 195 pounds.  Continues to try to increase physical activity and monitor her diet. Continue increase physical activity and more strict diet.   Patent ductus arteriosus-echocardiogram 07/01/2021 showed LVEF 0000000, normal diastolic  parameters, trivial mitral valve regurgitation, turbulent flow through PA but no definitive PDA. No plans for further evaluation at this time. Continue to monitor   Disposition:   Follow-up with Dr. Oval Linsey in 4-6 months.  {Are you ordering a CV Procedure (e.g. stress test, cath, DCCV, TEE, etc)?   Press F2        :[id number]    Medication Adjustments/Labs and Tests Ordered: Current medicines are reviewed at length with the patient today.  Concerns regarding medicines are outlined above.  No orders of the defined types were placed in this encounter.  No orders of the defined types were placed in this encounter.   There are no Patient Instructions on file for this visit.   Signed, Deberah Pelton, NP  07/29/2021 1:42 PM      Notice: This dictation was prepared with Dragon dictation along with smaller phrase technology. Any transcriptional errors that result from this process are unintentional and may not be corrected upon review.  I spent***minutes examining this patient, reviewing medications, and using patient centered shared decision making involving her cardiac care.  Prior to her visit I spent greater than 20 minutes reviewing her past medical history,  medications, and prior cardiac tests.

## 2021-07-31 ENCOUNTER — Ambulatory Visit (HOSPITAL_BASED_OUTPATIENT_CLINIC_OR_DEPARTMENT_OTHER): Payer: 59 | Admitting: General Practice

## 2021-07-31 ENCOUNTER — Ambulatory Visit (HOSPITAL_BASED_OUTPATIENT_CLINIC_OR_DEPARTMENT_OTHER): Payer: 59 | Admitting: Cardiovascular Disease

## 2021-08-02 ENCOUNTER — Ambulatory Visit (HOSPITAL_BASED_OUTPATIENT_CLINIC_OR_DEPARTMENT_OTHER): Payer: 59 | Admitting: Family

## 2021-08-02 ENCOUNTER — Other Ambulatory Visit: Payer: Self-pay

## 2021-08-02 VITALS — BP 130/86 | HR 97 | Ht 61.0 in | Wt 201.1 lb

## 2021-08-02 DIAGNOSIS — I10 Essential (primary) hypertension: Secondary | ICD-10-CM

## 2021-08-02 DIAGNOSIS — R0609 Other forms of dyspnea: Secondary | ICD-10-CM

## 2021-08-02 DIAGNOSIS — Q250 Patent ductus arteriosus: Secondary | ICD-10-CM

## 2021-08-02 NOTE — Progress Notes (Signed)
Office Visit    Patient Name: Nicole Stuart Date of Encounter: 08/02/2021  PCP:  Murlean Iba, NP   [facility]  Cardiologist:  Chilton Si, MD  Advanced Practice Provider:  No care team member to display Electrophysiologist:  None     Chief Complaint    Nicole Stuart is a 30 y.o. female with a hx of PDA, hypertension, diabetes, sickle cell trait, PCOS presents today for follow-up of exertional dyspnea  Past Medical History    Past Medical History:  Diagnosis Date   Diabetes mellitus type 2 in obese ([facility]) 11/02/2020   Dysrhythmia    first degree heart block.    Essential hypertension 11/02/2020   Gestational diabetes    Heart murmur    Hypertension    Patent ductus arteriosus    Sleep apnea    study done two weeks ago [place]    Past Surgical History:  Procedure Laterality Date   CESAREAN SECTION     CESAREAN SECTION N/A 05/08/2021   Procedure: CESAREAN SECTION;  Surgeon: Maxie Better, MD;  Location: [facility];  Service: Obstetrics;  Laterality: N/A;   NASAL SEPTOPLASTY W/ TURBINOPLASTY  09/22/2011   Procedure: NASAL SEPTOPLASTY WITH TURBINATE REDUCTION;  Surgeon: Drema Halon, MD;  Location: [facility] OR;  Service: ENT;  Laterality: Bilateral;   TONSILLECTOMY  09/22/2011   Procedure: TONSILLECTOMY;  Surgeon: Drema Halon, MD;  Location: [facility] OR;  Service: ENT;  Laterality: Bilateral;    Allergies  Allergies  Allergen Reactions   Chloraseptic [Benzocaine] Itching    CHG prep for c-section irriated skin and burned both CHG bath and wipes    History of Present Illness    Nicole Stuart is a 30 y.o. female with a hx of PDA, hypertension, diabetes, sickle cell trait, PCOS last seen 07/10/21.  Initial diagnosis of hypertension in 2020 in the setting of pregnancy.  The pregnancy was complicated by preeclampsia and gestational diabetes.  She delivered via 36 weeks.  Echocardiogram 2020 LVEF 60 to 65%, normal RV function, PDA  noted.  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  PDA felt to be small and unnecessary for closure and with recommendation for annual follow-up.  SBE prophylaxis was not recommended.  She was referred to the advanced hypertension clinic 11/02/2020 by her OB/GYN Dr. Cherly Hensen due to hypertension during second pregnancy.  She delivered via C-section 04/2021.  Nifedipine previously transition to labetalol.  Spironolactone added to obtain goal of less than 130/80.  Once in September 2022 by Dr. Duke Salvia she was feeling fatigued with activity. Seen 05/2021 with headache and Amlodipine added due to hypertension. She was seen 07/10/21 by Edd Fabian, NP nothing difficulty increasing her physical activity. Did not tolerate amlodipine 10 mg but was tolerating 5 mg without difficulty.  She was recommended to slowly increase activity over 1 month and follow-up.  She presents today for follow-up.  She is very pleased that she recently had her A1c rechecked and it was 4.9.  She is hopeful to get off some of her diabetic medications.  She notes she has been slowly increasing her physical activity and feels about 50% better.  Still with overexertion notes that she feels winded and weak.  This is most notable with quick position changes such as if she bends over to pick up her  older child it was about 35 pounds.  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  Does note that she was told postdelivery that she was anemic and to trial over-the-counter iron supplements but her CBC has not been checked recently.   EKGs/Labs/Other Studies Reviewed:   The following studies were reviewed today:  Nuclear Exercise Stress Test 06/04/2020: Exercise Sestamibi stress test: Exercise nuclear stress test was performed using  Bruce protocol. Patient reached 7 METS, and 85% of age predicted maximum heart rate. Exercise capacity was low Chest pain not reported. Normal heart rate response, hypertensive blood pressure response with peak BP 204/84 mmHg. Stress EKG revealed no ischemic changes. Normal myocardial perfusion. Stress LVEF 58%. Low risk study.    Echo 02/23/19 [facility]): Summary  There is no prior echocardiogram for comparison. Subcostal images not obtained  due to pregnancy. A patent ductus arteriousus is suspected.  Tricuspid valve is structurally normal.  No evidence of tricuspid stenosis.  Trace tricuspid regurgitation.  Pulmonic valve is structurally normal.  No evidence of pulmonic stenosis.  Trace pulmonic regurgitation.  Normal left ventricular size and systolic function with no appreciable  segmental abnormality.  Ejection fraction is visually estimated at 60-65%.  Diastolic function is normal.  Normal left ventricular wall thickness.  Normal size right atrium.  Normal right ventricular size and function.  The aortic root diameter is within normal limits.  The IVC was not visualized ([redacted] weeks gestation).  A patent ductus arteriosus is suspected.  The interatrial septum appears intact by color flow Doppler.    Echocardiogram 07/01/2021 IMPRESSIONS     1. Left ventricular ejection fraction, by estimation, is 55 to 60%. The  left ventricle has normal function. The left ventricle has no regional  wall motion abnormalities. Left ventricular diastolic parameters were  normal.   2. Right ventricular systolic function is normal. The right ventricular  size is normal.   3. The mitral valve is normal in structure. Trivial mitral valve  regurgitation.   4. The aortic valve is normal in structure. Aortic valve regurgitation is  not visualized.   5. Tubulent flow thorugh PA but no definitive PDA.   6. The inferior vena cava is normal in size with greater than 50%  respiratory variability,  suggesting right atrial pressure of 3 mmHg.  EKG: No EKG today  Recent Labs: 03/23/2021: ALT 14 05/09/2021: Hemoglobin 9.2; Platelets 281 05/23/2021: BUN 11; Creatinine, Ser 0.56; Potassium 4.4; Sodium 141  Recent Lipid Panel No results found for: CHOL, TRIG, HDL, CHOLHDL, VLDL, LDLCALC, LDLDIRECT   Home Medications   Current Meds  Medication Sig   acetaminophen (TYLENOL) 500 MG tablet Take 1,000 mg by mouth every 6 (six) hours as needed (headache).   amLODipine (NORVASC) 10 MG tablet Take 1 tablet (10 mg total) by mouth daily.   Continuous Blood Gluc Sensor (DEXCOM G6 SENSOR) MISC Use one sensor every 10 days   Continuous Blood Gluc Sensor (FREESTYLE LIBRE 2 SENSOR) MISC Apply 1 sensor every 14 days   Continuous Blood Gluc Transmit (DEXCOM G6 TRANSMITTER) MISC Use one transmitter every 90 days   docusate sodium (COLACE) 100 MG capsule Take 100 mg by mouth 2 (two) times daily as needed for mild constipation.   ibuprofen (ADVIL) 600 MG tablet Take 1 tablet by mouth every 6 hours as needed.   insulin glargine-yfgn (SEMGLEE, YFGN,) 100 UNIT/ML Pen Inject 20 Units into  the skin at bedtime.   Insulin Pen Needle (UNIFINE PENTIPS) 32G X 4 MM MISC Use as directed with insulin.  Patient using 4 pen needles per day   Insulin Pen Needle 32G X 4 MM MISC Use as directed with insulin up to 4 times a day   labetalol (NORMODYNE) 300 MG tablet Take 1 tablet (300 mg total) by mouth 2 (two) times daily.   metFORMIN (GLUCOPHAGE-XR) 500 MG 24 hr tablet Take 2 tablets by mouth twice a day   Prenatal Vit-Fe Fumarate-FA (PRENATAL VITAMIN PO) Take 1 tablet by mouth in the morning.   spironolactone (ALDACTONE) 25 MG tablet Take 1 tablet (25 mg total) by mouth daily.     Review of Systems      All other systems reviewed and are otherwise negative except as noted above.  Physical Exam    VS:  BP 130/86 (BP Location: Right Arm, Patient Position: Sitting, Cuff Size: Large)    Pulse 97    Ht 5\' 1"  (1.549 m)    Wt  201 lb 1.6 oz (91.2 kg)    SpO2 97%    BMI 38.00 kg/m  , BMI Body mass index is 38 kg/m.  Wt Readings from Last 3 Encounters:  08/02/21 201 lb 1.6 oz (91.2 kg)  07/10/21 195 lb (88.5 kg)  05/15/21 207 lb 3.2 oz (94 kg)     GEN: Well nourished, overweight, well developed, in no acute distress. HEENT: normal. Neck: Supple, no JVD, carotid bruits, or masses. Cardiac: RRR, no murmurs, rubs, or gallops. No clubbing, cyanosis, edema.  Radials/PT 2+ and equal bilaterally.  Respiratory:  Respirations regular and unlabored, clear to auscultation bilaterally. GI: Soft, nontender, nondistended. MS: No deformity or atrophy. Skin: Warm and dry, no rash. Neuro:  Strength and sensation are intact. Psych: Normal affect.  Assessment & Plan    Exertional dyspnea - Shares with me that this has improved 50% since gradually adding activity.  Lung sounds clear on exam.  Echo 06/2021 normal LVEF, no R WMA, no significant valvular abnormalities.. Anticipate deconditioning contributory.  Will refer to prep exercise program at the [facility].  We will collect CBC, BMP, TSH to rule out thyroid abnormality or anemia as contributory.  HTN - BP well controlled. Continue current antihypertensive regimen.  Continue to follow with the advanced hypertension clinic Dr[facility] and Duke Salvia, PA.  PDA -echo 06/2021 LVEF 55 to 60%, normal diastolic parameters, trivial MR, turbulent flow through PDA but no definitive PDA.  No plans for further evaluation at this time.  Not recommended for SBE prophylaxis.  Continue to monitor with periodic echocardiogram.  Obesity - Weight loss via diet and exercise encouraged. Discussed the impact being overweight would have on cardiovascular risk.   Disposition: Follow up in January as scheduled  with February, MD   Signed, Chilton Si, NP 08/02/2021, 2:49 PM [facility]

## 2021-08-02 NOTE — Patient Instructions (Signed)
Medication Instructions:  Your Physician recommend you continue on your current medication as directed.    *If you need a refill on your cardiac medications before your next appointment, please call your pharmacy*   Lab Work: Your physician recommends that you return for lab work today: CBC, CMET, Thyroid Panel  If you have labs (blood work) drawn today and your tests are completely normal, you will receive your results only by: MyChart Message (if you have MyChart) OR A paper copy in the mail If you have any lab test that is abnormal or we need to change your treatment, we will call you to review the results.   Testing/Procedures: None ordered today    Follow-Up: At [facility], you and your health needs are our priority.  As part of our continuing mission to provide you with exceptional heart care, we have created designated Provider Care Teams.  These Care Teams include your primary Cardiologist (physician) and Advanced Practice Providers (APPs -  Physician Assistants and Nurse Practitioners) who all work together to provide you with the care you need, when you need it.  We recommend signing up for the patient portal called "MyChart".  Sign up information is provided on this After Visit Summary.  MyChart is used to connect with patients for Virtual Visits (Telemedicine).  Patients are able to view lab/test results, encounter notes, upcoming appointments, etc.  Non-urgent messages can be sent to your provider as well.   To learn more about what you can do with MyChart, go to [email or web address].    Your next appointment:   As scheduled with Dr. Duke Salvia   The format for your next appointment:   In Person  Provider:   Chilton Si, MD    Other Instructions     Provider Referral Exercise Program (P.R.E.P.)      12 Weeks to Wellness       What is included in the PREP?  --Health Coaching and personalized exercise prescription --Full Membership to participating  [facility] for the 12 weeks --Pre- and Post-consultations to assess progress and formulate an exercise plan for       continuation of exercise   What is my investment?  Your cost is $100 (reg. $144). This includes full membership privileges to [facility] in [place] and across the country. If you complete the post-assessment visit, any applicable enrollment costs will be waived if you decide to join the [facility].   Who will benefit from PREP?  People with challenges, including (but not limited to): ---Low back pain ---Arthritis ---Hypertension ---Diabetes ---Obesity ---Joint Replacement ---Neuromuscular Disorders ---Cancer Recovery ---Weight Loss ---Many others (as determined by provider)   Get Started Today! Ask your healthcare provider to fill out a Healthcare Provider Referral for Exercise form. Be sure to turn it in before you leave the office and send/fax/email it directly to the Wellness RN to schedule your Initial Consultation. If you have family or friends that would also benefit, please share this referral with them.   Contacts:  [facility] [phone]    Viann Fish, Wellness RN  [phone]   [email or web address]

## 2021-08-03 ENCOUNTER — Encounter (HOSPITAL_BASED_OUTPATIENT_CLINIC_OR_DEPARTMENT_OTHER): Payer: Self-pay | Admitting: Family

## 2021-08-03 LAB — COMPREHENSIVE METABOLIC PANEL
ALT: 53 IU/L — ABNORMAL HIGH (ref 0–32)
AST: 32 IU/L (ref 0–40)
Albumin/Globulin Ratio: 1.4 (ref 1.2–2.2)
Albumin: 4.5 g/dL (ref 3.8–4.8)
Alkaline Phosphatase: 168 IU/L — ABNORMAL HIGH (ref 44–121)
BUN/Creatinine Ratio: 19 (ref 9–23)
BUN: 11 mg/dL (ref 6–20)
Bilirubin Total: 0.3 mg/dL (ref 0.0–1.2)
CO2: 25 mmol/L (ref 20–29)
Calcium: 10.2 mg/dL (ref 8.7–10.2)
Chloride: 101 mmol/L (ref 96–106)
Creatinine, Ser: 0.58 mg/dL (ref 0.57–1.00)
Globulin, Total: 3.2 g/dL (ref 1.5–4.5)
Glucose: 119 mg/dL — ABNORMAL HIGH (ref 70–99)
Potassium: 4.6 mmol/L (ref 3.5–5.2)
Sodium: 139 mmol/L (ref 134–144)
Total Protein: 7.7 g/dL (ref 6.0–8.5)
eGFR: 123 mL/min/{1.73_m2} (ref >59)

## 2021-08-03 LAB — THYROID PANEL WITH TSH
Free Thyroxine Index: 2 (ref 1.2–4.9)
T3 Uptake Ratio: 25 % (ref 24–39)
T4, Total: 8.1 ug/dL (ref 4.5–12.0)
TSH: 1.06 u[IU]/mL (ref 0.450–4.500)

## 2021-08-03 LAB — CBC
Hematocrit: 34.7 % (ref 34.0–46.6)
Hemoglobin: 11.5 g/dL (ref 11.1–15.9)
MCH: 24 pg — ABNORMAL LOW (ref 26.6–33.0)
MCHC: 33.1 g/dL (ref 31.5–35.7)
MCV: 72 fL — ABNORMAL LOW (ref 79–97)
Platelets: 381 10*3/uL (ref 150–450)
RBC: 4.79 x10E6/uL (ref 3.77–5.28)
RDW: 17 % — ABNORMAL HIGH (ref 11.7–15.4)
WBC: 11.1 10*3/uL — ABNORMAL HIGH (ref 3.4–10.8)

## 2021-08-05 ENCOUNTER — Telehealth: Payer: Self-pay

## 2021-08-05 DIAGNOSIS — Z0000 Encounter for general adult medical examination without abnormal findings: Secondary | ICD-10-CM

## 2021-08-05 NOTE — Telephone Encounter (Signed)
Called patient to determine if she was having connectivity issues with [employer]. Patient's last reading was on 12/12. Patient stated that she is having trouble with her phone but she is checking it everyday on schedule. Patient stated that she would like to put the app on her iPad to check her bp. Informed patient that a ticket will be placed with [employer] for technical assistance. Will follow up with patient if they are not able to get in contact with her. Patient verbally expressed understanding.      Tareva Leske Nedra Hai, [employer] [facility] Guide, Health Coach [address], [address] [place] [address] Telephone: [phone] Email: [email or web address]

## 2021-08-08 ENCOUNTER — Telehealth: Payer: Self-pay

## 2021-08-08 NOTE — Telephone Encounter (Signed)
Call to patient reference PREP referral  Explained program to pt.  Is interested in participating. Will call her with the dates of next class start when identified.

## 2021-08-13 ENCOUNTER — Other Ambulatory Visit (HOSPITAL_COMMUNITY): Payer: Self-pay

## 2021-08-14 ENCOUNTER — Other Ambulatory Visit (HOSPITAL_COMMUNITY): Payer: Self-pay

## 2021-08-14 NOTE — Progress Notes (Signed)
Attempted to call pt reference next PREP class at [facility] starting 08/19/21 Mailbox full Sent text to pt requesting call back to discuss M/W class 1pm-215pm-can she doe that one.

## 2021-08-15 ENCOUNTER — Telehealth: Payer: Self-pay

## 2021-08-15 ENCOUNTER — Other Ambulatory Visit (HOSPITAL_COMMUNITY): Payer: Self-pay

## 2021-08-15 DIAGNOSIS — Z0000 Encounter for general adult medical examination without abnormal findings: Secondary | ICD-10-CM

## 2021-08-15 MED ORDER — CARESTART COVID-19 HOME TEST VI KIT
PACK | 0 refills | Status: DC
  Filled 2021-08-15: qty 4, 4d supply, fill #0

## 2021-08-15 NOTE — Telephone Encounter (Signed)
Called to inform patient that [employer] has attempted to contact her five times to resolve her connectivity issues regarding checking her bp. Was going to provide the case # ([id number]) and the number to IT [phone]). Was not able to leave a message because mailbox was full.     Farah Benish Nedra Hai, [facility] Guide, Health Coach [address], [address] [place] [address] Telephone: [phone] Email: [email or web address]

## 2021-08-29 ENCOUNTER — Other Ambulatory Visit (HOSPITAL_COMMUNITY): Payer: Self-pay

## 2021-08-30 ENCOUNTER — Other Ambulatory Visit (HOSPITAL_COMMUNITY): Payer: Self-pay

## 2021-08-30 MED ORDER — NORETHINDRONE 0.35 MG PO TABS
ORAL_TABLET | ORAL | 0 refills | Status: DC
  Filled 2021-08-30 – 2021-09-13 (×2): qty 84, 84d supply, fill #0

## 2021-09-04 ENCOUNTER — Ambulatory Visit (HOSPITAL_BASED_OUTPATIENT_CLINIC_OR_DEPARTMENT_OTHER): Payer: 59 | Admitting: General Practice

## 2021-09-09 ENCOUNTER — Other Ambulatory Visit (HOSPITAL_COMMUNITY): Payer: Self-pay

## 2021-09-13 ENCOUNTER — Other Ambulatory Visit (HOSPITAL_COMMUNITY): Payer: Self-pay

## 2021-09-17 ENCOUNTER — Ambulatory Visit (HOSPITAL_BASED_OUTPATIENT_CLINIC_OR_DEPARTMENT_OTHER): Payer: 59 | Admitting: Cardiovascular Disease

## 2021-09-19 ENCOUNTER — Other Ambulatory Visit (HOSPITAL_COMMUNITY): Payer: Self-pay

## 2021-09-19 MED ORDER — SLYND 4 MG PO TABS
ORAL_TABLET | ORAL | 1 refills | Status: DC
  Filled 2021-09-19: qty 84, 84d supply, fill #0

## 2021-09-25 ENCOUNTER — Telehealth: Payer: Self-pay

## 2021-09-25 DIAGNOSIS — Z0000 Encounter for general adult medical examination without abnormal findings: Secondary | ICD-10-CM

## 2021-09-25 NOTE — Telephone Encounter (Signed)
Called patient to discuss concerns with connectivity. Patient needs to setup [employer] on new iPhone. Provided the patient with the [employer] number and placed a ticket regarding issue. Will follow up with patient on 2/13 if issue is not resolved.  Deno Sida Nedra Hai, [facility] Guide, Health Coach [address] [place] [address] Telephone: [phone] Email: [email or web address]

## 2021-09-27 ENCOUNTER — Telehealth: Payer: Self-pay

## 2021-09-27 NOTE — Telephone Encounter (Signed)
Call to pt reference next PREP class starting on 10/29/21 615p-730p T/Th at [facility] Confirmed she is able to do Will call back to schedule intake for pt

## 2021-10-21 ENCOUNTER — Telehealth: Payer: Self-pay

## 2021-10-21 NOTE — Telephone Encounter (Signed)
Call to pt reference next evening PREP class at [facility] starting on 11/05/21 ?Confirmed interest ?Intake scheduled for 10/29/21 at 415pm ?

## 2021-10-23 ENCOUNTER — Telehealth: Payer: Self-pay

## 2021-10-23 DIAGNOSIS — Z006 Encounter for examination for normal comparison and control in clinical research program: Secondary | ICD-10-CM

## 2021-10-23 NOTE — Telephone Encounter (Signed)
Spoke with pt about scheduling a follow up Hypertension Clinic Follow Up Visit. Pt stated she was going to call and schedule an appointment today once she got off the phone with me.  ?

## 2021-10-31 NOTE — Progress Notes (Signed)
[facility] PREP Evaluation ? ?Patient Details  ?Name: Nicole Stuart ?MRN: [id number] ?Date of Birth: 1990-08-26 ?Age: 31 y.o. ?PCP: Murlean Iba, NP ? ?Vitals:  ? 10/30/21 1630  ?BP: 132/70  ?Weight: 211 lb 12.8 oz (96.1 kg)  ? ? ? [facility] Eval - 10/31/21 1000   ? ?  ? [facility] "PREP" Location  ? [facility]P" [facility]   ?  ? Referral   ? Referring Provider Duke Salvia   ? Reason for referral Hypertension;Obesitity/Overweight;Diabetes   ? Program Start Date 11/05/21   T/TH 615p-730p x 12 wks  ?  ? Measurement  ? Waist Circumference 46.5 inches   ? Hip Circumference 45 inches   ? Body fat 42.9 percent   ?  ? Information for Trainer  ? Goals Lose 20 lbs, blood sugar improvements, inc endurance   ? Current Exercise walk at park 2 days per week with children   ? Orthopedic Concerns Left wrist fx 2 years ago   ? Pertinent Medical History Patent ductus arteriosus, HTN, DM2, birth 5 months ago   ? Current Barriers none, works F/S/S   ? Restrictions/Precautions Diabetic snack before exercise   ? Medications that affect exercise Beta blocker;Medication causing dizziness/drowsiness   ?  ? Timed Up and Go (TUGS)  ? Timed Up and Go Low risk <9 seconds   ?  ? Mobility and Daily Activities  ? I find it easy to walk up or down two or more flights of stairs. 1   ? I have no trouble taking out the trash. 3   ? I do housework such as vacuuming and dusting on my own without difficulty. 4   ? I can easily lift a gallon of milk (8lbs). 4   ? I can easily walk a mile. 1   ? I have no trouble reaching into high cupboards or reaching down to pick up something from the floor. 4   ? I do not have trouble doing out-door work such as Loss adjuster, chartered, raking leaves, or gardening. 2   ?  ? Mobility and Daily Activities  ? I feel younger than my age. 1   ? I feel independent. 4   ? I feel energetic. 3   ? I live an active life.  3   ? I feel strong. 3   ? I feel healthy. 2   ? I feel active as other people my age. 3   ?  ? How fit and strong are  you.  ? Fit and Strong Total Score 38   ? ?  ?  ? ?  ? ?Past Medical History:  ?Diagnosis Date  ? Diabetes mellitus type 2 in obese (HCC) 11/02/2020  ? Dysrhythmia   ? first degree heart block.   ? Essential hypertension 11/02/2020  ? Gestational diabetes   ? Heart murmur   ? Hypertension   ? Patent ductus arteriosus   ? Sleep apnea   ? study done two weeks ago [place]   ? ?Past Surgical History:  ?Procedure Laterality Date  ? CESAREAN SECTION    ? CESAREAN SECTION N/A 05/08/2021  ? Procedure: CESAREAN SECTION;  Surgeon: Maxie Better, MD;  Location: [facility] ORS;  Service: Obstetrics;  Laterality: N/A;  ? NASAL SEPTOPLASTY W/ TURBINOPLASTY  09/22/2011  ? Procedure: NASAL SEPTOPLASTY WITH TURBINATE REDUCTION;  Surgeon: Drema Halon, MD;  Location: [facility] OR;  Service: ENT;  Laterality: Bilateral;  ? TONSILLECTOMY  09/22/2011  ?  Procedure: TONSILLECTOMY;  Surgeon: Drema Halon, MD;  Location[facility] OR;  Service: ENT;  Laterality: Bilateral;  ? ?Social History  ? ?Tobacco Use  ?Smoking Status Never  ?Smokeless Tobacco Never  ?Recently has given up soda ?Encouraged to drink more water  ? ?Bonnye Fava ?10/31/2021, 10:12 AM ? ? ?

## 2021-11-06 NOTE — Progress Notes (Signed)
[facility] PREP Weekly Session ? ?Patient Details  ?Name: Nicole Stuart ?MRN: [id number] ?Date of Birth: 1991/05/03 ?Age: 31 y.o. ?PCP: Murlean Iba, NP ? ?There were no vitals filed for this visit. ? ? [facility] Weekly seesion - 11/06/21 0900   ? ?  ? [facility] "PREP" Location  ? [facility]P" [facility]   ?  ? Weekly Session  ? Topic Discussed Goal setting and welcome to the program   scale of percevied exertion, fit testing done  ? Classes attended to date 1   ? ?  ?  ? ?  ? ? ?Nicole Stuart ?11/06/2021, 9:22 AM ? ? ?

## 2021-11-07 ENCOUNTER — Other Ambulatory Visit (HOSPITAL_COMMUNITY): Payer: Self-pay

## 2021-11-13 NOTE — Progress Notes (Signed)
[facility] PREP Weekly Session ? ?Patient Details  ?Name: Nicole Stuart ?MRN: [id number] ?Date of Birth: November 09, 1990 ?Age: 31 y.o. ?PCP: Enzo Bi, NP ? ?Vitals:  ? 11/12/21 1830  ?Weight: 209 lb (94.8 kg)  ? ? ? [facility] Weekly seesion - 11/13/21 1000   ? ?  ? [facility] "PREP" Location  ? [facility] "PREP" Location [place]   ?  ? Weekly Session  ? Topic Discussed Importance of resistance training;Other ways to be active   Set consistent sleep/walk times  ? Minutes exercised this week 140 minutes   ? Classes attended to date 3   ? ?  ?  ? ?  ? ?Class held on 11/12/21 ?Barnett Hatter ?11/13/2021, 10:02 AM ? ? ?

## 2021-11-18 ENCOUNTER — Other Ambulatory Visit (HOSPITAL_COMMUNITY): Payer: Self-pay

## 2021-11-18 MED ORDER — PHEXXI 1.8-1-0.4 % VA GEL
VAGINAL | 5 refills | Status: DC
  Filled 2021-11-18: qty 60, 30d supply, fill #0

## 2021-11-20 NOTE — Progress Notes (Signed)
[facility] PREP Weekly Session ? ?Patient Details  ?Name: Nicole Stuart ?MRN: [id number] ?Date of Birth: 1991-01-30 ?Age: 31 y.o. ?PCP: Enzo Bi, NP ? ?Vitals:  ? 11/19/21 1830  ?Weight: 211 lb 12.8 oz (96.1 kg)  ? ? ? [facility] Weekly seesion - 11/20/21 1300   ? ?  ? [facility] "PREP" Location  ? [facility] "PREP" Location [place]   ?  ? Weekly Session  ? Topic Discussed Stress management and problem solving   meditation  ? Minutes exercised this week 150 minutes   ? Classes attended to date 5   ? ?  ?  ? ?  ? ? ?Barnett Hatter ?11/20/2021, 1:25 PM ? ? ?

## 2021-11-22 ENCOUNTER — Other Ambulatory Visit (HOSPITAL_COMMUNITY): Payer: Self-pay

## 2021-11-23 ENCOUNTER — Other Ambulatory Visit (HOSPITAL_COMMUNITY): Payer: Self-pay

## 2021-11-25 ENCOUNTER — Other Ambulatory Visit (HOSPITAL_COMMUNITY): Payer: Self-pay

## 2021-11-26 ENCOUNTER — Other Ambulatory Visit (HOSPITAL_COMMUNITY): Payer: Self-pay

## 2021-11-27 NOTE — Progress Notes (Signed)
[facility] PREP Weekly Session ? ?Patient Details  ?Name: Nicole Stuart ?MRN: [id number] ?Date of Birth: [birth year] ?Age: 31 y.o. ?PCP: Murlean Iba, NP ? ?Vitals:  ? 11/26/21 1830  ?Weight: 212 lb 6.4 oz (96.3 kg)  ? ? ? [facility] Weekly seesion - 11/27/21 0900   ? ?  ? [facility] "PREP" Location  ? [facility]P" [facility]   ?  ? Weekly Session  ? Topic Discussed Healthy eating tips   ? Minutes exercised this week 145 minutes   ? Classes attended to date 6   ? ?  ?  ? ?  ? ? ?Bonnye Fava ?11/27/2021, 9:40 AM ? ? ?

## 2021-12-04 DIAGNOSIS — I10 Essential (primary) hypertension: Secondary | ICD-10-CM | POA: Diagnosis not present

## 2021-12-04 DIAGNOSIS — E1169 Type 2 diabetes mellitus with other specified complication: Secondary | ICD-10-CM | POA: Diagnosis not present

## 2021-12-04 DIAGNOSIS — E282 Polycystic ovarian syndrome: Secondary | ICD-10-CM | POA: Diagnosis not present

## 2021-12-04 DIAGNOSIS — E1165 Type 2 diabetes mellitus with hyperglycemia: Secondary | ICD-10-CM | POA: Diagnosis not present

## 2021-12-04 DIAGNOSIS — D573 Sickle-cell trait: Secondary | ICD-10-CM | POA: Diagnosis not present

## 2021-12-04 DIAGNOSIS — Z6837 Body mass index (BMI) 37.0-37.9, adult: Secondary | ICD-10-CM | POA: Diagnosis not present

## 2021-12-04 NOTE — Progress Notes (Signed)
[facility] PREP Weekly Session ? ?Patient Details  ?Name: Nicole Stuart ?MRN: [id number] ?Date of Birth: 05/05/1991 ?Age: 31 y.o. ?PCP: Murlean Iba, NP ? ?Vitals:  ? 12/03/21 1830  ?Weight: 212 lb (96.2 kg)  ? ? ? [facility] Weekly seesion - 12/04/21 1200   ? ?  ? [facility] "PREP" Location  ? [facility]P" [facility]   ?  ? Weekly Session  ? Topic Discussed Health habits   ? Minutes exercised this week 130 minutes   ? Classes attended to date 7   ? ?  ?  ? ?  ? ? ?Bonnye Fava ?12/04/2021, 12:17 PM ? ? ?

## 2021-12-05 ENCOUNTER — Other Ambulatory Visit (HOSPITAL_COMMUNITY): Payer: Self-pay

## 2021-12-06 ENCOUNTER — Other Ambulatory Visit (HOSPITAL_COMMUNITY): Payer: Self-pay

## 2021-12-06 MED ORDER — DEXCOM G7 SENSOR MISC
11 refills | Status: DC
  Filled 2021-12-06: qty 3, 30d supply, fill #0
  Filled 2022-01-02: qty 3, 30d supply, fill #1
  Filled 2022-02-22: qty 3, 30d supply, fill #2
  Filled 2022-03-17: qty 3, 30d supply, fill #3
  Filled 2022-06-07 – 2022-07-20 (×3): qty 3, 30d supply, fill #4

## 2021-12-12 NOTE — Progress Notes (Signed)
[facility] PREP Weekly Session ? ?Patient Details  ?Name: Nicole Stuart ?MRN: [id number] ?Date of Birth: 12-09-1990 ?Age: 31 y.o. ?PCP: Murlean Iba, NP ? ?Vitals:  ? 12/10/21 1830  ?Weight: 210 lb 3.2 oz (95.3 kg)  ? ? ? [facility] Weekly seesion - 12/12/21 1700   ? ?  ? [facility] "PREP" Location  ? [facility] "PREP" [facility]   ?  ? Weekly Session  ? Topic Discussed Restaurant Eating   Salt and sugar demo  ? Minutes exercised this week 115 minutes   ? Classes attended to date 8   ? ?  ?  ? ?  ? ? ?Bonnye Fava ?12/12/2021, 5:26 PM ? ? ?

## 2021-12-19 NOTE — Progress Notes (Signed)
[facility] PREP Weekly Session ? ?Patient Details  ?Name: Nicole Stuart ?MRN: [id number] ?Date of Birth: Apr 24, 1991 ?Age: 32 y.o. ?PCP: Murlean Iba, NP ? ?Vitals:  ? 12/17/21 1830  ?Weight: 208 lb 6.4 oz (94.5 kg)  ? ? ? [facility] Weekly seesion - 12/19/21 1600   ? ?  ? [facility] "PREP" Location  ? [facility]P" [facility]   ?  ? Weekly Session  ? Topic Discussed Other   Mindset, goal setting  ? Minutes exercised this week 140 minutes   ? Classes attended to date 10   ? ?  ?  ? ?  ? ? ?Bonnye Fava ?12/19/2021, 4:44 PM ? ? ?

## 2022-01-02 ENCOUNTER — Other Ambulatory Visit (HOSPITAL_COMMUNITY): Payer: Self-pay

## 2022-01-15 ENCOUNTER — Other Ambulatory Visit (HOSPITAL_COMMUNITY): Payer: Self-pay

## 2022-01-15 DIAGNOSIS — Z6837 Body mass index (BMI) 37.0-37.9, adult: Secondary | ICD-10-CM | POA: Diagnosis not present

## 2022-01-15 DIAGNOSIS — I10 Essential (primary) hypertension: Secondary | ICD-10-CM | POA: Diagnosis not present

## 2022-01-15 DIAGNOSIS — D573 Sickle-cell trait: Secondary | ICD-10-CM | POA: Diagnosis not present

## 2022-01-15 DIAGNOSIS — E1169 Type 2 diabetes mellitus with other specified complication: Secondary | ICD-10-CM | POA: Diagnosis not present

## 2022-01-15 DIAGNOSIS — E282 Polycystic ovarian syndrome: Secondary | ICD-10-CM | POA: Diagnosis not present

## 2022-01-15 MED ORDER — OZEMPIC (1 MG/DOSE) 4 MG/3ML ~~LOC~~ SOPN
PEN_INJECTOR | SUBCUTANEOUS | 4 refills | Status: DC
  Filled 2022-01-15: qty 3, 28d supply, fill #0
  Filled 2022-02-10: qty 3, 28d supply, fill #1
  Filled 2022-03-17: qty 3, 28d supply, fill #2
  Filled 2022-04-15: qty 3, 28d supply, fill #3

## 2022-01-16 ENCOUNTER — Other Ambulatory Visit (HOSPITAL_COMMUNITY): Payer: Self-pay

## 2022-01-29 NOTE — Progress Notes (Signed)
PREP final class 01/23/22 Total workouts 11 of 24 Total educational sessions 7 of 12  Did not return for final measurements

## 2022-01-31 ENCOUNTER — Telehealth: Payer: 59 | Admitting: Physician Assistant

## 2022-01-31 DIAGNOSIS — H109 Unspecified conjunctivitis: Secondary | ICD-10-CM | POA: Diagnosis not present

## 2022-01-31 MED ORDER — POLYMYXIN B-TRIMETHOPRIM 10000-0.1 UNIT/ML-% OP SOLN: 1.0000 [drp] | OPHTHALMIC | 0 refills | Status: DC

## 2022-01-31 NOTE — Progress Notes (Signed)
Virtual Visit Consent   [facility], you are scheduled for a virtual visit with a [facility] provider today. Just as with appointments in the office, your consent must be obtained to participate. Your consent will be active for this visit and any virtual visit you may have with one of our providers in the next 365 days. If you have a MyChart account, a copy of this consent can be sent to you electronically.  As this is a virtual visit, video technology does not allow for your provider to perform a traditional examination. This may limit your provider's ability to fully assess your condition. If your provider identifies any concerns that need to be evaluated in person or the need to arrange testing (such as labs, EKG, etc.), we will make arrangements to do so. Although advances in technology are sophisticated, we cannot ensure that it will always work on either your end or our end. If the connection with a video visit is poor, the visit may have to be switched to a telephone visit. With either a video or telephone visit, we are not always able to ensure that we have a secure connection.  By engaging in this virtual visit, you consent to the provision of healthcare and authorize for your insurance to be billed (if applicable) for the services provided during this visit. Depending on your insurance coverage, you may receive a charge related to this service.  I need to obtain your verbal consent now. Are you willing to proceed with your visit today? [name] has provided verbal consent on 01/31/2022 for a virtual visit (video or telephone). Mar Daring, PA-C  Date: 01/31/2022 7:33 PM  Virtual Visit via Video Note   I, Mar Daring, connected with  Nicole Stuart  ([id number], 09-Feb-1991) on 01/31/22 at  7:30 PM EDT by a video-enabled telemedicine application and verified that I am speaking with the correct person using two identifiers.  Location: Patient: Virtual Visit Location Patient:  Home Provider: Virtual Visit Location Provider: Home Office   I discussed the limitations of evaluation and management by telemedicine and the availability of in person appointments. The patient expressed understanding and agreed to proceed.    History of Present Illness: Nicole Stuart is a 31 y.o. who identifies as a female who was assigned female at birth, and is being seen today for possible pink eye.  HPI: Conjunctivitis  The current episode started today. The onset was sudden. The problem occurs continuously. The problem has been gradually worsening. The problem is mild. Nothing relieves the symptoms. Nothing aggravates the symptoms. Associated symptoms include eye discharge (purulent drainage), eye pain and eye redness. Pertinent negatives include no fever, no decreased vision, no double vision, no eye itching, no photophobia, no congestion, no headaches, no rhinorrhea, no sore throat and no URI. The eye pain is mild. The right eye is affected. The eye pain is not associated with movement. The eyelid exhibits no abnormality.   She is with family at [facility] when symptoms arose.   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 05/08/2021   Postpartum care following cesarean delivery 05/08/2021   Diabetes mellitus type 2 in obese ([place]) 11/02/2020   Essential hypertension 11/02/2020   Sickle cell trait in mother affecting pregnancy ([place]) 03/25/2019   Patent ductus arteriosus 03/07/2019   Heart murmur 10/11/2014   Obesity (BMI 30-39.9) 10/11/2014    Allergies:  Allergies  Allergen Reactions   Chloraseptic [Benzocaine] Itching    CHG prep for c-section irriated skin and burned both CHG bath and wipes   Medications:  Current Outpatient Medications:    trimethoprim-polymyxin b (POLYTRIM) ophthalmic solution, Place 1 drop into the right eye every 4 (four) hours. X 5 days, Disp: 10 mL, Rfl: 0    acetaminophen (TYLENOL) 500 MG tablet, Take 1,000 mg by mouth every 6 (six) hours as needed (headache)., Disp: , Rfl:    amLODipine (NORVASC) 10 MG tablet, Take 1 tablet (10 mg total) by mouth daily., Disp: 90 tablet, Rfl: 3   Continuous Blood Gluc Sensor (DEXCOM G6 SENSOR) MISC, Use one sensor every 10 days, Disp: 3 each, Rfl: 3   Continuous Blood Gluc Sensor (DEXCOM G7 SENSOR) MISC, Use 1 sensor every 10 days, Disp: 3 each, Rfl: 11   Continuous Blood Gluc Sensor (FREESTYLE LIBRE 2 SENSOR) MISC, Apply 1 sensor every 14 days, Disp: 2 each, Rfl: 11   Continuous Blood Gluc Transmit (DEXCOM G6 TRANSMITTER) MISC, Use one transmitter every 90 days, Disp: 1 each, Rfl: 3   COVID-19 At Home Antigen Test (CARESTART COVID-19 HOME TEST) KIT, Use as directed, Disp: 4 each, Rfl: 0   docusate sodium (COLACE) 100 MG capsule, Take 100 mg by mouth 2 (two) times daily as needed for mild constipation., Disp: , Rfl:    ibuprofen (ADVIL) 600 MG tablet, Take 1 tablet by mouth every 6 hours as needed., Disp: 30 tablet, Rfl: 11   insulin glargine-yfgn (SEMGLEE, YFGN,) 100 UNIT/ML Pen, Inject 20 Units into the skin at bedtime., Disp: 30 mL, Rfl: 11   Insulin Pen Needle (UNIFINE PENTIPS) 32G X 4 MM MISC, Use as directed with insulin.  Patient using 4 pen needles per day, Disp: 100 each, Rfl: 11   Insulin Pen Needle 32G X 4 MM MISC, Use as directed with insulin up to 4 times a day, Disp: 120 each, Rfl: 6   labetalol (NORMODYNE) 300 MG tablet, Take 1 tablet (300 mg total) by mouth 2 (two) times daily., Disp: 180 tablet, Rfl: 3   metFORMIN (GLUCOPHAGE-XR) 500 MG 24 hr tablet, Take 2 tablets by mouth twice a day, Disp: 120 tablet, Rfl: 5   norethindrone (MICRONOR) 0.35 MG tablet, Take 1 Tablet by mouth every day., Disp: 84 tablet, Rfl: 0   PHEXXI 1.8-1-0.4 % GEL, Administer 1 applicator intravaginally as needed  immediately before or up to 1 hour before [facility] act of vaginal intercourse as needed., Disp: 60 g, Rfl: 5   Prenatal  Vit-Fe Fumarate-FA (PRENATAL VITAMIN PO), Take 1 tablet by mouth in the morning., Disp: , Rfl:    Semaglutide, 1 MG/DOSE, (OZEMPIC, 1 MG/DOSE,) 4 MG/3ML SOPN, Inject 1 mg into the skin once a week, Disp: 3 mL, Rfl: 4   SLYND 4 MG TABS, Take 1 tablet by mouth once daily., Disp: 84 tablet, Rfl: 1   spironolactone (ALDACTONE) 25 MG tablet, Take 1 tablet (25 mg total) by mouth daily., Disp: 90 tablet, Rfl: 3  Observations/Objective: Patient is well-developed, well-nourished in no acute distress.  Resting comfortably at home.  Head is normocephalic, atraumatic.  No labored breathing.  Speech is clear and coherent with logical content.  Patient is alert and oriented at baseline.    Assessment and Plan: 1. Bacterial conjunctivitis of right eye - trimethoprim-polymyxin b (POLYTRIM) ophthalmic solution; Place 1 drop into the right eye every 4 (four) hours. X 5 days  Dispense: 10 mL; Refill: 0  - Suspect  bacterial conjunctivitis - Polytrim prescribed - Warm compresses - Good hand hygiene - Seek in person evaluation if not improving or if symptoms worsen  Follow Up Instructions: I discussed the assessment and treatment plan with the patient. The patient was provided an opportunity to ask questions and all were answered. The patient agreed with the plan and demonstrated an understanding of the instructions.  A copy of instructions were sent to the patient via MyChart unless otherwise noted below.    The patient was advised to call back or seek an in-person evaluation if the symptoms worsen or if the condition fails to improve as anticipated.  Time:  I spent 10 minutes with the patient via telehealth technology discussing the above problems/concerns.    Mar Daring, PA-C

## 2022-01-31 NOTE — Patient Instructions (Signed)
[place], thank you for joining Mar Daring, PA-C for today's virtual visit.  While this provider is not your primary care provider (PCP), if your PCP is located in our provider database this encounter information will be shared with them immediately following your visit.  Consent: (Patient) Nicole Stuart provided verbal consent for this virtual visit at the beginning of the encounter.  Current Medications:  Current Outpatient Medications:    trimethoprim-polymyxin b (POLYTRIM) ophthalmic solution, Place 1 drop into the right eye every 4 (four) hours. X 5 days, Disp: 10 mL, Rfl: 0   acetaminophen (TYLENOL) 500 MG tablet, Take 1,000 mg by mouth every 6 (six) hours as needed (headache)., Disp: , Rfl:    amLODipine (NORVASC) 10 MG tablet, Take 1 tablet (10 mg total) by mouth daily., Disp: 90 tablet, Rfl: 3   Continuous Blood Gluc Sensor (DEXCOM G6 SENSOR) MISC, Use one sensor every 10 days, Disp: 3 each, Rfl: 3   Continuous Blood Gluc Sensor (DEXCOM G7 SENSOR) MISC, Use 1 sensor every 10 days, Disp: 3 each, Rfl: 11   Continuous Blood Gluc Sensor (FREESTYLE LIBRE 2 SENSOR) MISC, Apply 1 sensor every 14 days, Disp: 2 each, Rfl: 11   Continuous Blood Gluc Transmit (DEXCOM G6 TRANSMITTER) MISC, Use one transmitter every 90 days, Disp: 1 each, Rfl: 3   COVID-19 At Home Antigen Test (CARESTART COVID-19 HOME TEST) KIT, Use as directed, Disp: 4 each, Rfl: 0   docusate sodium (COLACE) 100 MG capsule, Take 100 mg by mouth 2 (two) times daily as needed for mild constipation., Disp: , Rfl:    ibuprofen (ADVIL) 600 MG tablet, Take 1 tablet by mouth every 6 hours as needed., Disp: 30 tablet, Rfl: 11   insulin glargine-yfgn (SEMGLEE, YFGN,) 100 UNIT/ML Pen, Inject 20 Units into the skin at bedtime., Disp: 30 mL, Rfl: 11   Insulin Pen Needle (UNIFINE PENTIPS) 32G X 4 MM MISC, Use as directed with insulin.  Patient using 4 pen needles per day, Disp: 100 each, Rfl: 11   Insulin Pen Needle 32G X 4 MM  MISC, Use as directed with insulin up to 4 times a day, Disp: 120 each, Rfl: 6   labetalol (NORMODYNE) 300 MG tablet, Take 1 tablet (300 mg total) by mouth 2 (two) times daily., Disp: 180 tablet, Rfl: 3   metFORMIN (GLUCOPHAGE-XR) 500 MG 24 hr tablet, Take 2 tablets by mouth twice a day, Disp: 120 tablet, Rfl: 5   norethindrone (MICRONOR) 0.35 MG tablet, Take 1 Tablet by mouth every day., Disp: 84 tablet, Rfl: 0   PHEXXI 1.8-1-0.4 % GEL, Administer 1 applicator intravaginally as needed  immediately before or up to 1 hour before [facility] act of vaginal intercourse as needed., Disp: 60 g, Rfl: 5   Prenatal Vit-Fe Fumarate-FA (PRENATAL VITAMIN PO), Take 1 tablet by mouth in the morning., Disp: , Rfl:    Semaglutide, 1 MG/DOSE, (OZEMPIC, 1 MG/DOSE,) 4 MG/3ML SOPN, Inject 1 mg into the skin once a week, Disp: 3 mL, Rfl: 4   SLYND 4 MG TABS, Take 1 tablet by mouth once daily., Disp: 84 tablet, Rfl: 1   spironolactone (ALDACTONE) 25 MG tablet, Take 1 tablet (25 mg total) by mouth daily., Disp: 90 tablet, Rfl: 3   Medications ordered in this encounter:  Meds ordered this encounter  Medications   trimethoprim-polymyxin b (POLYTRIM) ophthalmic solution    Sig: Place 1 drop into the right eye every 4 (four) hours. X 5 days  Dispense:  10 mL    Refill:  0    Order Specific Question:   Supervising Provider    Answer:   Sabra Heck, BRIAN [[id number]]     *If you need refills on other medications prior to your next appointment, please contact your pharmacy*  Follow-Up: Call back or seek an in-person evaluation if the symptoms worsen or if the condition fails to improve as anticipated.  Other Instructions Bacterial Conjunctivitis, Adult Bacterial conjunctivitis is an infection of your conjunctiva. This is the clear membrane that covers the white part of your eye and the inner part of your eyelid. This infection can make your eye: Red or pink. Itchy or irritated. This condition spreads easily from person to person  (is contagious) and from one eye to the other eye. What are the causes? This condition is caused by germs (bacteria). You may get the infection if you come into close contact with: A person who has the infection. Items that have germs on them (are contaminated), such as face towels, contact lens solution, or eye makeup. What increases the risk? You are more likely to get this condition if: You have contact with people who have the infection. You wear contact lenses. You have a sinus infection. You have had a recent eye injury or surgery. You have a weak body defense system (immune system). You have dry eyes. What are the signs or symptoms?  Thick, yellowish discharge from the eye. Tearing or watery eyes. Itchy eyes. Burning feeling in your eyes. Eye redness. Swollen eyelids. Blurred vision. How is this treated?  Antibiotic eye drops or ointment. Antibiotic medicine taken by mouth. This is used for infections that do not get better with drops or ointment or that last more than 10 days. Cool, wet cloths placed on the eyes. Artificial tears used 2-6 times a day. Follow these instructions at home: Medicines Take or apply your antibiotic medicine as told by your doctor. Do not stop using it even if you start to feel better. Take or apply over-the-counter and prescription medicines only as told by your doctor. Do not touch your eyelid with the eye-drop bottle or the ointment tube. Managing discomfort Wipe any fluid from your eye with a warm, wet washcloth or a cotton ball. Place a clean, cool, wet cloth on your eye. Do this for 10-20 minutes, 3-4 times a day. General instructions Do not wear contacts until the infection is gone. Wear glasses until your doctor says it is okay to wear contacts again. Do not wear eye makeup until the infection is gone. Throw away old eye makeup. Change or wash your pillowcase every day. Do not share towels or washcloths. Wash your hands often with  soap and water for at least 20 seconds and especially before touching your face or eyes. Use paper towels to dry your hands. Do not touch or rub your eyes. Do not drive or use heavy machinery if your vision is blurred. Contact a doctor if: You have a fever. You do not get better after 10 days. Get help right away if: You have a fever and your symptoms get worse all of a sudden. You have very bad pain when you move your eye. Your face: Hurts. Is red. Is swollen. You have sudden loss of vision. Summary Bacterial conjunctivitis is an infection of your conjunctiva. This infection spreads easily from person to person. Wash your hands often with soap and water for at least 20 seconds and especially before touching your face or  eyes. Use paper towels to dry your hands. Take or apply your antibiotic medicine as told by your doctor. Contact a doctor if you have a fever or you do not get better after 10 days. This information is not intended to replace advice given to you by your health care provider. Make sure you discuss any questions you have with your health care provider. Document Revised: 11/14/2020 Document Reviewed: 11/14/2020 Elsevier Patient Education  [place].    If you have been instructed to have an in-person evaluation today at a local Urgent Care facility, please use the link below. It will take you to a list of all of our available [facility], including address, phone number and hours of operation. Please do not delay care.  [facility]  If you or a family member do not have a primary care provider, use the link below to schedule a visit and establish care. When you choose a [facility] primary care physician or advanced practice provider, you gain a long-term partner in health. Find a Primary Care Provider  Learn more about [facility]'s in-office and virtual care options: [facility]

## 2022-02-10 ENCOUNTER — Other Ambulatory Visit (HOSPITAL_COMMUNITY): Payer: Self-pay

## 2022-02-11 ENCOUNTER — Other Ambulatory Visit (HOSPITAL_COMMUNITY): Payer: Self-pay

## 2022-02-14 ENCOUNTER — Other Ambulatory Visit (HOSPITAL_COMMUNITY): Payer: Self-pay

## 2022-02-22 ENCOUNTER — Other Ambulatory Visit (HOSPITAL_COMMUNITY): Payer: Self-pay

## 2022-03-17 ENCOUNTER — Other Ambulatory Visit (HOSPITAL_COMMUNITY): Payer: Self-pay

## 2022-04-15 ENCOUNTER — Other Ambulatory Visit (HOSPITAL_COMMUNITY): Payer: Self-pay

## 2022-04-16 ENCOUNTER — Other Ambulatory Visit (HOSPITAL_COMMUNITY): Payer: Self-pay

## 2022-04-16 MED ORDER — METFORMIN HCL ER 500 MG PO TB24
500.0000 mg | ORAL_TABLET | Freq: Two times a day (BID) | ORAL | 1 refills | Status: DC
  Filled 2022-04-16: qty 60, 30d supply, fill #0
  Filled 2022-06-07: qty 60, 30d supply, fill #1

## 2022-04-30 ENCOUNTER — Other Ambulatory Visit (HOSPITAL_COMMUNITY): Payer: Self-pay

## 2022-04-30 DIAGNOSIS — E1169 Type 2 diabetes mellitus with other specified complication: Secondary | ICD-10-CM | POA: Diagnosis not present

## 2022-04-30 DIAGNOSIS — E1165 Type 2 diabetes mellitus with hyperglycemia: Secondary | ICD-10-CM | POA: Diagnosis not present

## 2022-04-30 DIAGNOSIS — I10 Essential (primary) hypertension: Secondary | ICD-10-CM | POA: Diagnosis not present

## 2022-04-30 MED ORDER — MOUNJARO 5 MG/0.5ML ~~LOC~~ SOAJ
SUBCUTANEOUS | 3 refills | Status: DC
  Filled 2022-04-30: qty 2, 30d supply, fill #0
  Filled 2022-06-07: qty 2, 30d supply, fill #1
  Filled 2022-07-20: qty 2, 30d supply, fill #2

## 2022-05-06 ENCOUNTER — Other Ambulatory Visit (HOSPITAL_COMMUNITY): Payer: Self-pay

## 2022-06-07 ENCOUNTER — Other Ambulatory Visit (HOSPITAL_COMMUNITY): Payer: Self-pay

## 2022-06-09 ENCOUNTER — Other Ambulatory Visit (HOSPITAL_COMMUNITY): Payer: Self-pay

## 2022-06-12 ENCOUNTER — Other Ambulatory Visit (HOSPITAL_COMMUNITY): Payer: Self-pay

## 2022-06-16 ENCOUNTER — Telehealth: Payer: Self-pay | Admitting: Obstetrics

## 2022-06-16 ENCOUNTER — Other Ambulatory Visit (HOSPITAL_COMMUNITY): Payer: Self-pay

## 2022-06-16 NOTE — Telephone Encounter (Signed)
Patient is coming in on 11/01 at 1:55 with Nicole Stuart for IUD placement (not sure which one )

## 2022-06-18 ENCOUNTER — Encounter: Payer: Self-pay | Admitting: Obstetrics

## 2022-06-18 ENCOUNTER — Ambulatory Visit (INDEPENDENT_AMBULATORY_CARE_PROVIDER_SITE_OTHER): Payer: 59 | Admitting: Obstetrics

## 2022-06-18 ENCOUNTER — Other Ambulatory Visit (HOSPITAL_COMMUNITY): Payer: Self-pay

## 2022-06-18 VITALS — BP 140/80 | HR 86 | Ht 61.0 in | Wt 196.0 lb

## 2022-06-18 DIAGNOSIS — Z789 Other specified health status: Secondary | ICD-10-CM

## 2022-06-18 DIAGNOSIS — Z309 Encounter for contraceptive management, unspecified: Secondary | ICD-10-CM

## 2022-06-18 DIAGNOSIS — Z538 Procedure and treatment not carried out for other reasons: Secondary | ICD-10-CM

## 2022-06-18 DIAGNOSIS — Z30430 Encounter for insertion of intrauterine contraceptive device: Secondary | ICD-10-CM | POA: Diagnosis not present

## 2022-06-18 MED ORDER — PHEXXI 1.8-1-0.4 % VA GEL
5.0000 g | VAGINAL | 1 refills | Status: DC | PRN
  Filled 2022-06-18: qty 5, 5d supply, fill #0

## 2022-06-18 MED ORDER — LORAZEPAM 0.5 MG PO TABS
0.5000 mg | ORAL_TABLET | Freq: Three times a day (TID) | ORAL | 0 refills | Status: DC
  Filled 2022-06-18: qty 3, 1d supply, fill #0

## 2022-06-18 MED ORDER — MISOPROSTOL 200 MCG PO TABS
200.0000 ug | ORAL_TABLET | Freq: Once | ORAL | 0 refills | Status: DC
  Filled 2022-06-18: qty 1, 1d supply, fill #0

## 2022-06-18 MED ORDER — LIDOCAINE HCL URETHRAL/MUCOSAL 2 % EX PRSY
1.0000 | PREFILLED_SYRINGE | CUTANEOUS | 0 refills | Status: DC | PRN
  Filled 2022-06-18: qty 10, fill #0

## 2022-06-18 NOTE — Addendum Note (Signed)
Addended by: Lloyd Huger on: 06/18/2022 05:53 PM   Modules accepted: Orders

## 2022-06-18 NOTE — Progress Notes (Signed)
ENCOUNTER FOR IUD INSERTION   Subjective  Nicole Stuart is a 31 y.o. [id number] who presents today for IUD insertion. She desires reversible long-term contraception. We have thoroughly reviewed the risks, benefits, and alternatives, and she has elected to proceed with .Mirena insertion.   Objective LMP 05/16/2022 (Within Days)   Breastfeeding No   UPT: negative/positive  Procedure Note Consent was obtained prior to the procedure. A bimanual exam was performed to determine the position of the uterus. A sterile speculum was placed in the vagina, and the cervix was visualized. Betadine was applied to the cervix. Lidocaine gel was applied to the cervix. A single-toothed tenaculum was placed on the anterior lip of the cervix, and gentle traction was applied to straighten and stabilize it. A uterine sound was introduced into the cervix. Elli was not able to tolerate this. The procedure was paused and the tenaculum was removed. When she was ready, the procedure was attempted again. However, she was not able to tolerate it, and the procedure was stopped.    [name] still desires IUD placement and reports that she had an easier time at a prior placement by having misoprostol prior to the procedure. She would also like a pre-procedure anxiolytic. Rx sent to pharmacy. She would like to use Phexxi for contraception in the meantime.    Lloyd Huger, CNM

## 2022-06-19 ENCOUNTER — Other Ambulatory Visit (HOSPITAL_COMMUNITY): Payer: Self-pay

## 2022-06-20 ENCOUNTER — Other Ambulatory Visit (HOSPITAL_COMMUNITY): Payer: Self-pay

## 2022-06-21 ENCOUNTER — Other Ambulatory Visit (HOSPITAL_COMMUNITY): Payer: Self-pay

## 2022-06-24 ENCOUNTER — Other Ambulatory Visit (HOSPITAL_COMMUNITY): Payer: Self-pay

## 2022-06-25 ENCOUNTER — Other Ambulatory Visit (HOSPITAL_COMMUNITY): Payer: Self-pay

## 2022-06-26 ENCOUNTER — Encounter (HOSPITAL_BASED_OUTPATIENT_CLINIC_OR_DEPARTMENT_OTHER): Payer: Self-pay | Admitting: Cardiovascular Disease

## 2022-06-26 ENCOUNTER — Other Ambulatory Visit (HOSPITAL_COMMUNITY): Payer: Self-pay

## 2022-06-26 ENCOUNTER — Ambulatory Visit (INDEPENDENT_AMBULATORY_CARE_PROVIDER_SITE_OTHER): Payer: 59 | Admitting: Cardiovascular Disease

## 2022-06-26 VITALS — BP 116/75 | HR 80 | Ht 61.0 in | Wt 195.0 lb

## 2022-06-26 DIAGNOSIS — Z01812 Encounter for preprocedural laboratory examination: Secondary | ICD-10-CM | POA: Diagnosis not present

## 2022-06-26 DIAGNOSIS — I10 Essential (primary) hypertension: Secondary | ICD-10-CM | POA: Diagnosis not present

## 2022-06-26 DIAGNOSIS — Q250 Patent ductus arteriosus: Secondary | ICD-10-CM

## 2022-06-26 DIAGNOSIS — Z006 Encounter for examination for normal comparison and control in clinical research program: Secondary | ICD-10-CM

## 2022-06-26 DIAGNOSIS — E669 Obesity, unspecified: Secondary | ICD-10-CM

## 2022-06-26 MED ORDER — METOPROLOL SUCCINATE ER 50 MG PO TB24
50.0000 mg | ORAL_TABLET | Freq: Every day | ORAL | 3 refills | Status: DC
  Filled 2022-06-26: qty 90, 90d supply, fill #0

## 2022-06-26 NOTE — Progress Notes (Signed)
Advanced Hypertension Clinic Follow-up:    Date:  06/26/2022   ID:  Nicole Stuart, DOB May 17, 1991, MRN [id number]  PCP:  Murlean Iba, NP  Cardiologist:  Chilton Si, MD  Congenital cardiologist: Dr. Lyndel Safe, [facility]  Referring MD: Murlean Iba, NP   CC: Hypertension  History of Present Illness:    Nicole Stuart is a 31 y.o. female with a hx of PDA, hypertension, diabetes, sickle cell trait, and PCOS here for follow-up. She established care in the advanced hypertension clinic 11/02/2020. She was seen by Dr. Cherly Hensen on 3/4 for confirmation of pregnancy.  She was started on nifedipine 30mg  and her spironolactone and HCTZ were disconitnued.   She was first diagnosed with hypertension in 2020.  It occurred during pregnancy.  The pregnancy was complicated by preeclampsia and gestational diabetes.  After delivery both hypertension and diabetes persisted.  She notes that initially in the pregnancy her blood pressure was low but it became increasingly elevated.  She ultimately delivered via cesarean section at 36 weeks.  Her OB/GYN noted that she had a murmur.  She reported that her murmur was longstanding since childhood.   She had an echo in 2020 that revealed LVEF 60 to 65% with normal right ventricular function.  A PDA was noted.  She saw pediatric cardiology at [facility] and given that she was asymptomatic with normal left ventricular function and no evidence of chamber dilation, she was thought to be low risk for vaginal delivery.  Her PDA was thought to be too small and it was unnecessary to close.  Given her low SVR they thought it was unlikely that she would go into heart failure during her pregnancy and recommended annual follow-up.  They did not feel that she needed SBE prophylaxis.  She is also previously been seen by [facility] cardiology for chest pain and had stress test that was unremarkable.  They have also been assisting with her blood pressure management.  She had tachycardia to  the 130's with her last pregnancy. Nifedipine was stopped and she was switched to Labetalol. She delivered her baby via cesarean section 04/2021. After delivery her blood pressure increased and her LE edema recurred. Spirolactone was restarted and labetalol was increased. She had an Echo 06/2021 which revealed LVEF 55-60% with normal diastolic function, right atrial pressure was three, there was turbulent flow in the PA, but no definite PDA. She followed up with 07/2021 NP, and blood pressures were better controlled, but she showed with exertional dyspnea.   Today, the patient states that she has been feeling good recently. She has been getting exercise two days a week. She struggles to tolerate a lot of exercise without experiencing tachycardia of 180 bpm and SOB. She has noticed these exertional symptoms both during and after her pregnancy and feels unable to keep up with other people in her life. She states that she has lost weight recently. Her blood pressure medicine has been making her feel worse; notably she had measured her pressure at 90/40 during one of these episodes of lightheadedness. She denies any palpitations, chest pain, or peripheral edema. No headaches, syncope, orthopnea, or PND.   Previous antihypertensives: HCTZ Spironolactone Losartan   Past Medical History:  Diagnosis Date   Diabetes mellitus type 2 in obese (HCC) 11/02/2020   Dysrhythmia    first degree heart block.    Essential hypertension 11/02/2020   Gestational diabetes    Heart murmur    Hypertension    Patent  ductus arteriosus    Sleep apnea    study done two weeks ago [place]     Past Surgical History:  Procedure Laterality Date   CESAREAN SECTION     CESAREAN SECTION N/A 05/08/2021   Procedure: CESAREAN SECTION;  Surgeon: Maxie Better, MD;  Location: [facility] ORS;  Service: Obstetrics;  Laterality: N/A;   NASAL SEPTOPLASTY W/ TURBINOPLASTY  09/22/2011   Procedure: NASAL SEPTOPLASTY WITH  TURBINATE REDUCTION;  Surgeon: Drema Halon, MD;  Location: [facility] OR;  Service: ENT;  Laterality: Bilateral;   TONSILLECTOMY  09/22/2011   Procedure: TONSILLECTOMY;  Surgeon: Drema Halon, MD;  Location: [facility] OR;  Service: ENT;  Laterality: Bilateral;    Current Medications: Current Meds  Medication Sig   amLODipine (NORVASC) 10 MG tablet Take 1 tablet (10 mg total) by mouth daily.   Continuous Blood Gluc Sensor (DEXCOM G7 SENSOR) MISC Use 1 sensor every 10 days   docusate sodium (COLACE) 100 MG capsule Take 100 mg by mouth 2 (two) times daily as needed for mild constipation.   ibuprofen (ADVIL) 600 MG tablet Take 1 tablet by mouth every 6 hours as needed.   Lactic Ac-Citric Ac-Pot Bitart (PHEXXI) 1.8-1-0.4 % GEL Place 5 g vaginally as needed.   lidocaine (XYLOCAINE) 2 % jelly Apply 1 Application topically as needed. Apply to cervix 30 min before procedure   LORazepam (ATIVAN) 0.5 MG tablet Take 1-2 tablets by mouth 30 minutes before procedure. May take the third tablet if needed.   metFORMIN (GLUCOPHAGE-XR) 500 MG 24 hr tablet Take 2 tablets by mouth twice a day   metoprolol succinate (TOPROL-XL) 50 MG 24 hr tablet Take 1 tablet (50 mg total) by mouth daily. Take with or immediately following a meal.   Prenatal Vit-Fe Fumarate-FA (PRENATAL VITAMIN PO) Take 1 tablet by mouth in the morning.   spironolactone (ALDACTONE) 25 MG tablet Take 1 tablet (25 mg total) by mouth daily.   tirzepatide (MOUNJARO) 5 MG/0.5ML Pen Inject 1 pen (5 MG) into skin once weekly 30 days   [DISCONTINUED] labetalol (NORMODYNE) 300 MG tablet Take 1 tablet (300 mg total) by mouth 2 (two) times daily.     Allergies:   Chlorhexidine   Social History   Socioeconomic History   Marital status: Married    Spouse name: Not on file   Number of children: Not on file   Years of education: Not on file   Highest education level: Not on file  Occupational History   Not on file  Tobacco Use   Smoking status:  Never   Smokeless tobacco: Never  Vaping Use   Vaping Use: Never used  Substance and Sexual Activity   Alcohol use: No   Drug use: No   Sexual activity: Yes  Other Topics Concern   Not on file  Social History Narrative   Pt is L&D RN at [employer]   Social Determinants of Health   Financial Resource Strain: Not on file  Food Insecurity: No Food Insecurity (11/02/2020)   Hunger Vital Sign    Worried About Running Out of Food in the Last Year: Never true    Ran Out of Food in the Last Year: Never true  Transportation Needs: No Transportation Needs (11/02/2020)   PRAPARE - Administrator, Civil Service (Medical): No    Lack of Transportation (Non-Medical): No  Physical Activity: Insufficiently Active (11/02/2020)   Exercise Vital Sign    Days of Exercise per Week: 2 days  Minutes of Exercise per Session: 60 min  Stress: No Stress Concern Present (11/02/2020)   [employer] of Occupational Health - Occupational Stress Questionnaire    Feeling of Stress : Not at all  Social Connections: Not on file     Family History: The patient's family history includes Diabetes in her mother; Hypertension in her brother and sister; Obesity in her mother. She was adopted.  ROS:   Please see the history of present illness. (+)Lightheadedness (+)SOB All other systems reviewed and are negative.  EKGs/Labs/Other Studies Reviewed:    EKG:  The EKG is personally reviewed 06/26/2022: The EKG is not ordered. 05/15/2021: Sinus rhythm. Rate 74 bpm. 11/02/2020: sinus rhythm/sinus arrhythmia.  Rate 86 bpm.  Nuclear Exercise Stress Test 06/04/2020: Exercise Sestamibi stress test: Exercise nuclear stress test was performed using Bruce protocol. Patient reached 7 METS, and 85% of age predicted maximum heart rate. Exercise capacity was low Chest pain not reported. Normal heart rate response, hypertensive blood pressure response with peak BP 204/84 mmHg. Stress EKG revealed no ischemic  changes. Normal myocardial perfusion. Stress LVEF 58%. Low risk study.   Echo 02/23/19 [facility]): Summary  There is no prior echocardiogram for comparison. Subcostal images not obtained  due to pregnancy. A patent ductus arteriousus is suspected.  Tricuspid valve is structurally normal.  No evidence of tricuspid stenosis.  Trace tricuspid regurgitation.  Pulmonic valve is structurally normal.  No evidence of pulmonic stenosis.  Trace pulmonic regurgitation.  Normal left ventricular size and systolic function with no appreciable  segmental abnormality.  Ejection fraction is visually estimated at 60-65%.  Diastolic function is normal.  Normal left ventricular wall thickness.  Normal size right atrium.  Normal right ventricular size and function.  The aortic root diameter is within normal limits.  The IVC was not visualized ([redacted] weeks gestation).  A patent ductus arteriosus is suspected.  The interatrial septum appears intact by color flow Doppler.    Recent Labs: 08/02/2021: ALT 53; BUN 11; Creatinine, Ser 0.58; Hemoglobin 11.5; Platelets 381; Potassium 4.6; Sodium 139; TSH 1.060   Recent Lipid Panel No results found for: "CHOL", "TRIG", "HDL", "CHOLHDL", "VLDL", "LDLCALC", "LDLDIRECT"  Physical Exam:   VS:  BP 116/75 (BP Location: Left Arm, Patient Position: Sitting, Cuff Size: Large)   Pulse 80   Ht 5\' 1"  (1.549 m)   Wt 195 lb (88.5 kg)   LMP 05/16/2022 (Within Days)   SpO2 96%   BMI 36.84 kg/m  , BMI Body mass index is 36.84 kg/m. GENERAL:  Well appearing HEENT: Pupils equal round and reactive, fundi not visualized, oral mucosa unremarkable NECK:  No jugular venous distention, waveform within normal limits, carotid upstroke brisk and symmetric, no bruits LUNGS:  Clear to auscultation bilaterally HEART:  RRR.  PMI not displaced or sustained,S1 and S2 within normal limits, no S3, no S4, no clicks, no rubs, no murmur ABD:  Positive bowel sounds normal in frequency in pitch,  no bruits, no rebound, no guarding, no midline pulsatile mass, no hepatomegaly, no splenomegaly EXT:  2 plus pulses throughout, trace bilateral LE edema, no cyanosis no clubbing SKIN:  No rashes no nodules NEURO:  Cranial nerves II through XII grossly intact, motor grossly intact throughout PSYCH:  Cognitively intact, oriented to person place and time   ASSESSMENT:    1. PDA (patent ductus arteriosus)   2. Essential hypertension   3. Pre-procedure lab exam   4. Patent ductus arteriosus   5. Obesity (BMI 30-39.9)  PLAN:   Essential hypertension Blood pressure is now much better controlled.  It has been low since she reduced her labetalol to once daily.  She also struggles with tachycardia when she tries to exert herself.  We will switch her labetalol to metoprolol succinate 50 mg daily.  Continue amlodipine and spironolactone for now.  Hopefully will be able to reduce these soon as well.  She has no plans for future pregnancies.  Patent ductus arteriosus She likely has a small PDA.  On her echo done here last year there was turbulent flow in the PA but no clear PDA.  She has symptoms with exertion and does not seem to be getting any better with exercise.  I am wondering if she is shunting worse with exertion.  We will get a cardiac MRI to better determine if she has a PDA, the size, and to quantify the shunting.  Although she does not have evidence of left atrial or left ventricular enlargement on echo.  If she is symptomatic it may be worth considering closure.  She has not had any episodes of endocarditis.    Obesity (BMI 30-39.9) She is doing a good job of exercising and losing weight.     Disposition: FU with PharmD in 1 month. FU with Kabeer Hoagland C. Duke Salvia, MD, [facility] in 4 months.   Medication Adjustments/Labs and Tests Ordered: Current medicines are reviewed at length with the patient today.  Concerns regarding medicines are outlined above.   Orders Placed This Encounter   Procedures   MR CARDIAC MORPHOLOGY W WO CONTRAST   CBC with Differential/Platelet   Basic metabolic panel   Meds ordered this encounter  Medications   metoprolol succinate (TOPROL-XL) 50 MG 24 hr tablet    Sig: Take 1 tablet (50 mg total) by mouth daily. Take with or immediately following a meal.    Dispense:  90 tablet    Refill:  3    D/C LABETALOL   I,Coren O'Brien,acting as a scribe for Chilton Si, MD.,have documented all relevant documentation on the behalf of Chilton Si, MD,as directed by  Chilton Si, MD while in the presence of Chilton Si, MD.  I, Claretta Kendra C. Duke Salvia, MD have reviewed all documentation for this visit.  The documentation of the exam, diagnosis, procedures, and orders on 06/26/2022 are all accurate and complete.   Signed, Chilton Si, MD  06/26/2022 3:32 PM    [facility]

## 2022-06-26 NOTE — Research (Signed)
I saw pt today after Dr. Ector's follow up visit. Pt is in Dr. [place]'s Virtual Care HTN Study. Pt filled out research survey. Pt was enrolled in Group 2. Pt has successfully reached her blood pressure goal and completed the Virtual Care HTN Study. 

## 2022-06-26 NOTE — Patient Instructions (Signed)
Medication Instructions:  STOP LABETALOL  START METOPROLOL SUCC 50 MG DAILY    *If you need a refill on your cardiac medications before your next appointment, please call your pharmacy*  Lab Work: CBC/BMET 1 WEEK PRIOR TO CARDIAC MRI  If you have labs (blood work) drawn today and your tests are completely normal, you will receive your results only by: MyChart Message (if you have MyChart) OR A paper copy in the mail If you have any lab test that is abnormal or we need to change your treatment, we will call you to review the results.  Testing/Procedures: Your physician has requested that you have a cardiac MRI. Cardiac MRI uses a computer to create images of your heart as its beating, producing both still and moving pictures of your heart and major blood vessels. For further information please visit [email or web address]. Please follow the instruction sheet given to you today for more information.  Follow-Up: At [facility], you and your health needs are our priority.  As part of our continuing mission to provide you with exceptional heart care, we have created designated Provider Care Teams.  These Care Teams include your primary Cardiologist (physician) and Advanced Practice Providers (APPs -  Physician Assistants and Nurse Practitioners) who all work together to provide you with the care you need, when you need it.  We recommend signing up for the patient portal called "MyChart".  Sign up information is provided on this After Visit Summary.  MyChart is used to connect with patients for Virtual Visits (Telemedicine).  Patients are able to view lab/test results, encounter notes, upcoming appointments, etc.  Non-urgent messages can be sent to your provider as well.   To learn more about what you can do with MyChart, go to [email or web address].    Your next appointment:   1 month(s)  The format for your next appointment:   In Person  Provider:   Chilton Si, MD or Gillian Shields, NP

## 2022-06-26 NOTE — Assessment & Plan Note (Signed)
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  On her echo done here last year there was turbulent flow in the PA but no clear PDA.  She has symptoms with exertion and does not seem to be getting any better with exercise.  I am wondering if she is shunting worse with exertion.  We will get a cardiac MRI to better determine if she has a PDA, the size, and to quantify the shunting.  Although she does not have evidence of left atrial or left ventricular enlargement on echo.  If she is symptomatic it may be worth considering closure.  She has not had any episodes of endocarditis.

## 2022-06-26 NOTE — Assessment & Plan Note (Signed)
She is doing a good job of exercising and losing weight.

## 2022-06-26 NOTE — Assessment & Plan Note (Signed)
Blood pressure is now much better controlled.  It has been low since she reduced her labetalol to once daily.  She also struggles with tachycardia when she tries to exert herself.  We will switch her labetalol to metoprolol succinate 50 mg daily.  Continue amlodipine and spironolactone for now.  Hopefully will be able to reduce these soon as well.  She has no plans for future pregnancies.

## 2022-06-27 ENCOUNTER — Encounter: Payer: Self-pay | Admitting: Obstetrics

## 2022-06-27 ENCOUNTER — Other Ambulatory Visit (HOSPITAL_COMMUNITY): Payer: Self-pay

## 2022-07-02 ENCOUNTER — Other Ambulatory Visit (HOSPITAL_COMMUNITY): Payer: Self-pay

## 2022-07-04 NOTE — Telephone Encounter (Signed)
Noted. Failed Mirena insertion

## 2022-07-07 ENCOUNTER — Other Ambulatory Visit (HOSPITAL_COMMUNITY): Payer: Self-pay

## 2022-07-09 ENCOUNTER — Other Ambulatory Visit (HOSPITAL_COMMUNITY): Payer: Self-pay

## 2022-07-11 ENCOUNTER — Other Ambulatory Visit (HOSPITAL_COMMUNITY): Payer: Self-pay

## 2022-07-20 ENCOUNTER — Other Ambulatory Visit: Payer: Self-pay | Admitting: Cardiovascular Disease

## 2022-07-20 ENCOUNTER — Other Ambulatory Visit (HOSPITAL_COMMUNITY): Payer: Self-pay

## 2022-07-21 ENCOUNTER — Other Ambulatory Visit (HOSPITAL_COMMUNITY): Payer: Self-pay

## 2022-07-21 MED ORDER — AMLODIPINE BESYLATE 10 MG PO TABS
10.0000 mg | ORAL_TABLET | Freq: Every day | ORAL | 1 refills | Status: DC
  Filled 2022-07-21: qty 90, 90d supply, fill #0

## 2022-07-21 MED ORDER — METFORMIN HCL ER 500 MG PO TB24
500.0000 mg | ORAL_TABLET | Freq: Two times a day (BID) | ORAL | 0 refills | Status: DC
  Filled 2022-07-21: qty 60, 30d supply, fill #0

## 2022-07-21 NOTE — Telephone Encounter (Signed)
Rx request sent to pharmacy.  

## 2022-07-22 ENCOUNTER — Other Ambulatory Visit (HOSPITAL_COMMUNITY): Payer: Self-pay

## 2022-07-22 MED ORDER — MOUNJARO 5 MG/0.5ML ~~LOC~~ SOAJ
5.0000 mg | SUBCUTANEOUS | 3 refills | Status: DC
  Filled 2022-07-22 – 2022-08-19 (×2): qty 2, 28d supply, fill #0

## 2022-07-22 MED ORDER — DEXCOM G7 SENSOR MISC
11 refills | Status: DC
  Filled 2022-07-22 – 2022-08-04 (×2): qty 3, 30d supply, fill #0

## 2022-08-04 ENCOUNTER — Other Ambulatory Visit (HOSPITAL_COMMUNITY): Payer: Self-pay

## 2022-08-04 IMAGING — US US MFM FETAL BPP W/O NON-STRESS
1 series · 15 of 28 positions shown · non-contrast
Comparison: none

[Series 1: us mfm fetal bpp w/o non-stress · 38 acquisitions, 15 frames shown]
[im 1/38]
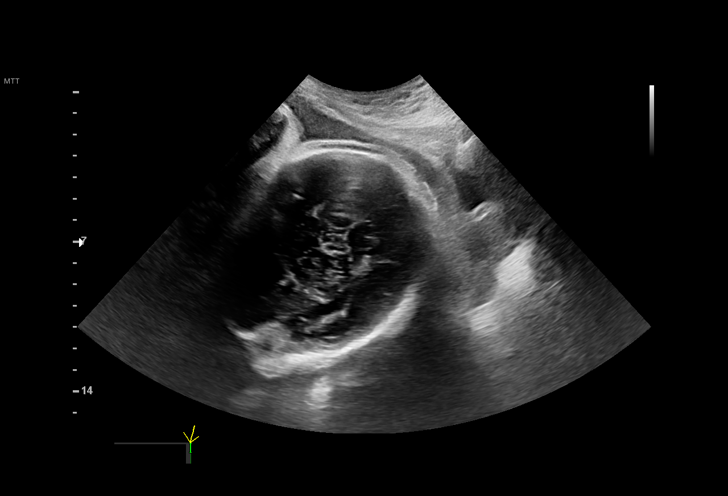
[im 3/38]
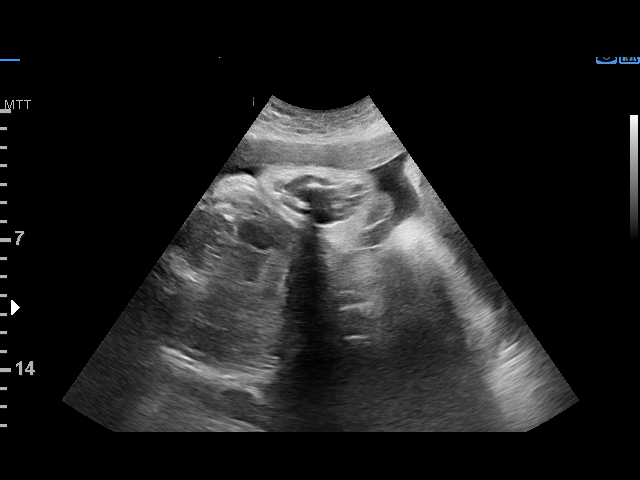
[im 6/38]
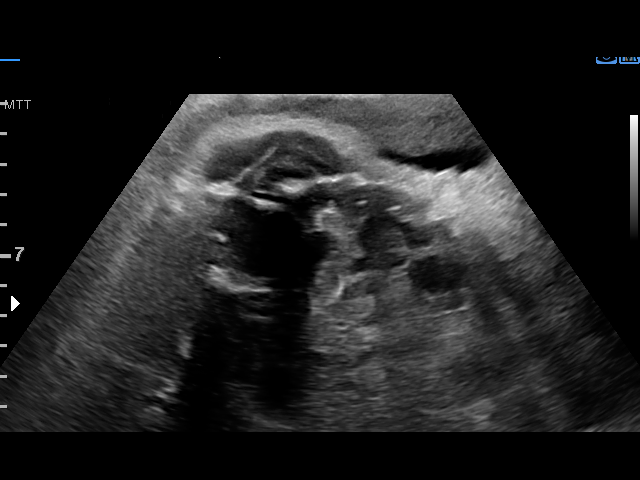
[im 9/38]
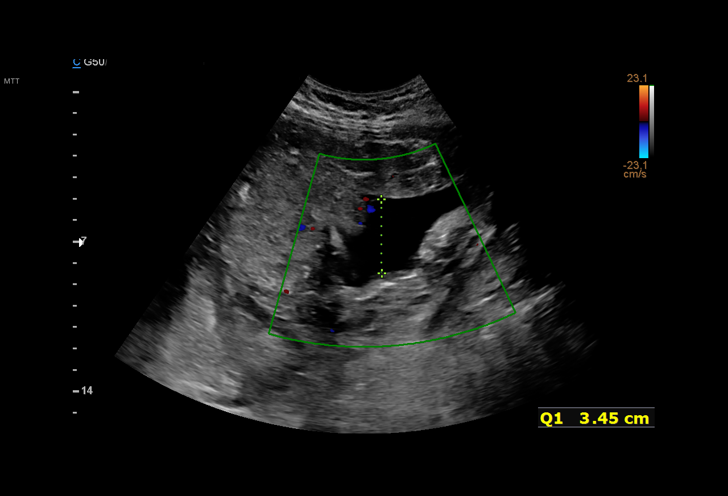
[im 11/38]
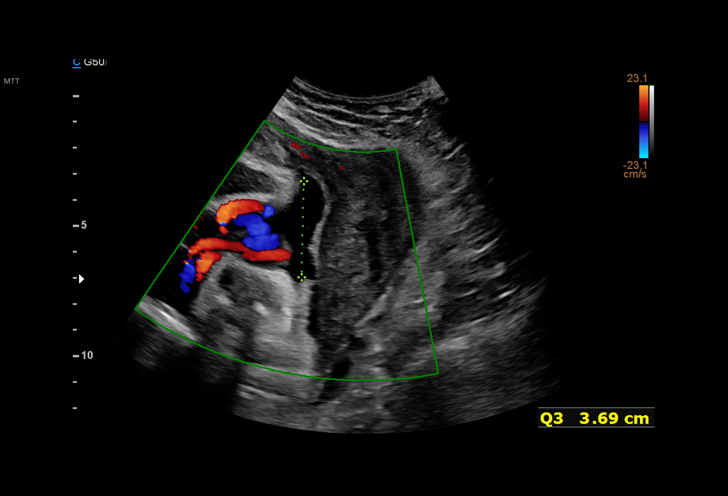
[im 14/38]
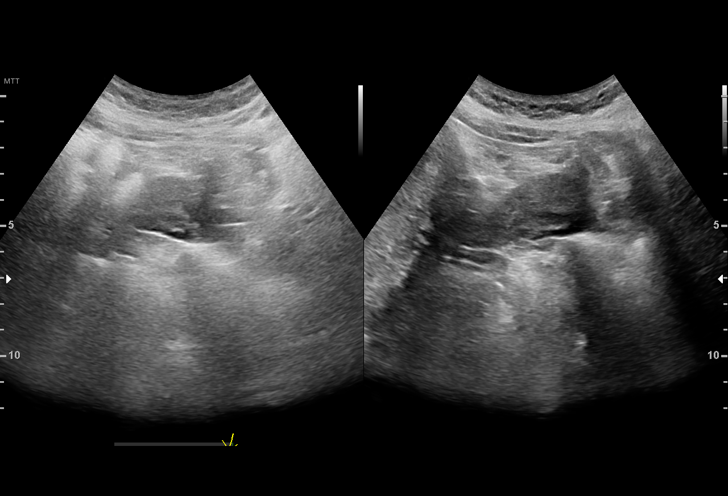
[im 17/38]
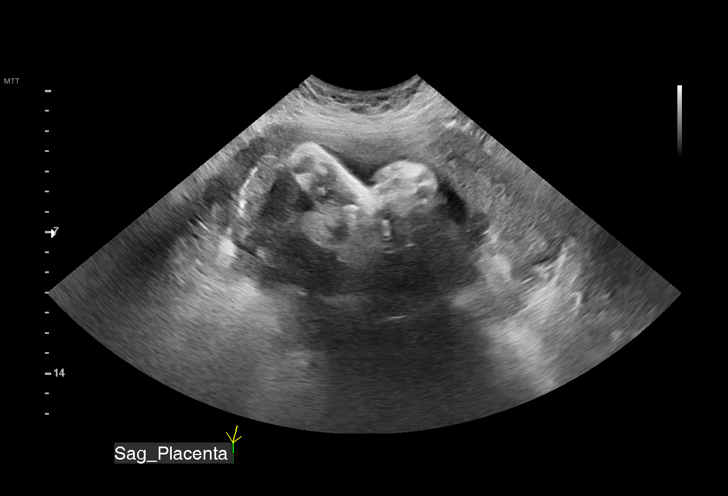
[im 20/38]
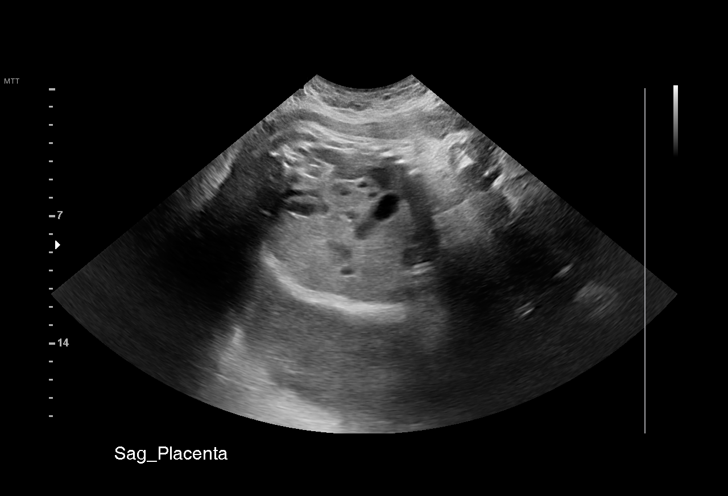
[im 21/38]
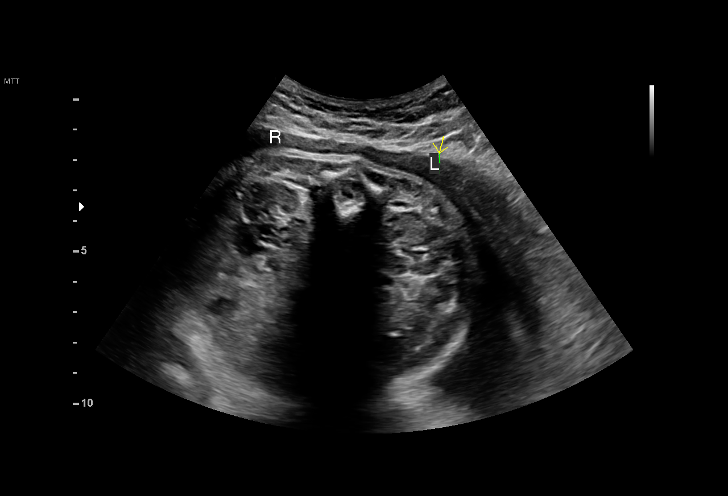
[im 24/38]
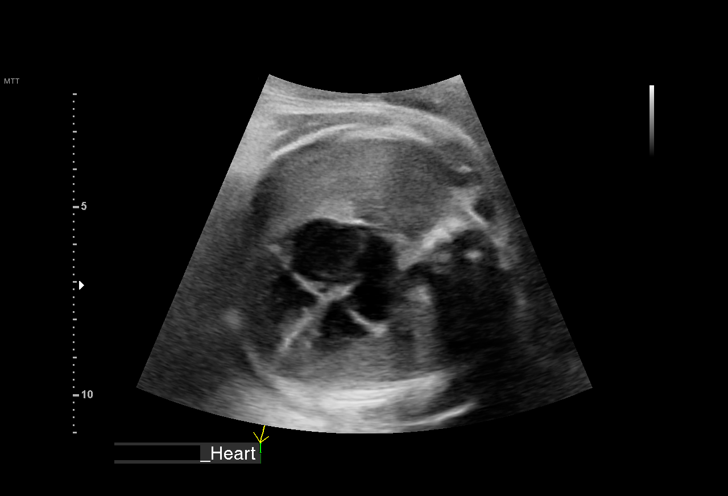
[im 27/38]
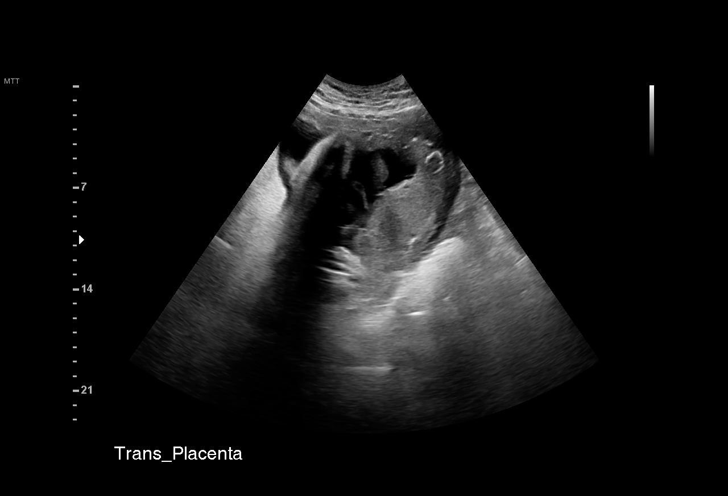
[im 29/38]
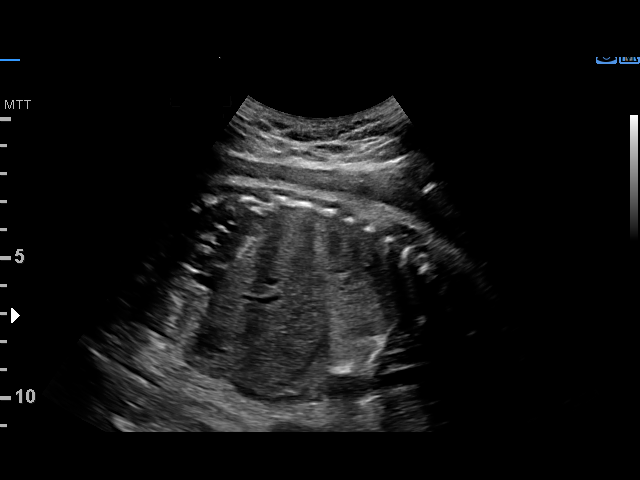
[im 32/38]
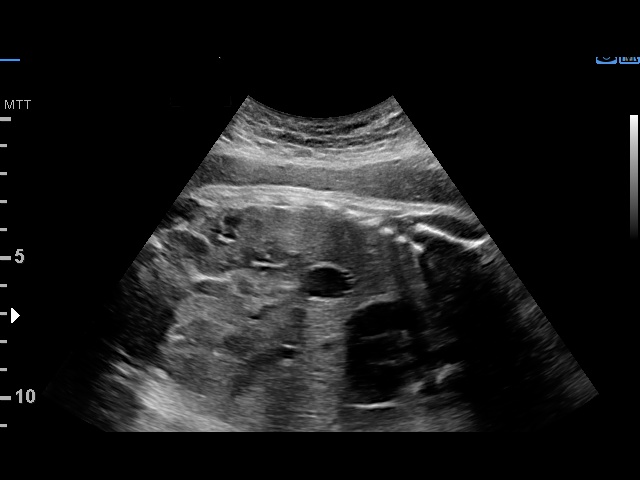
[im 35/38]
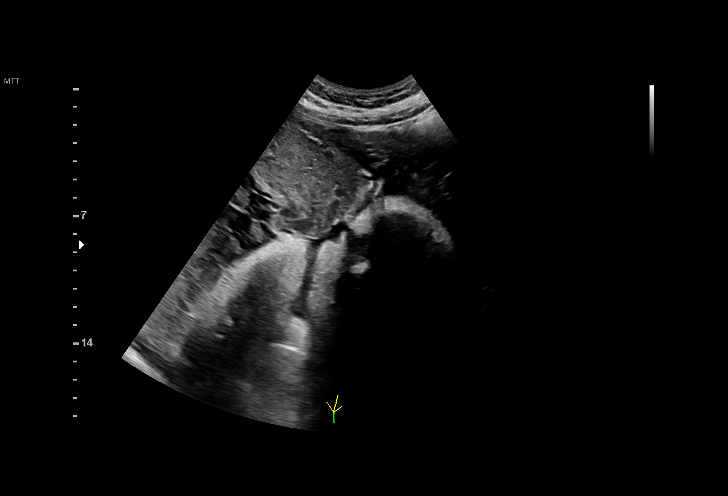
[im 38/38]
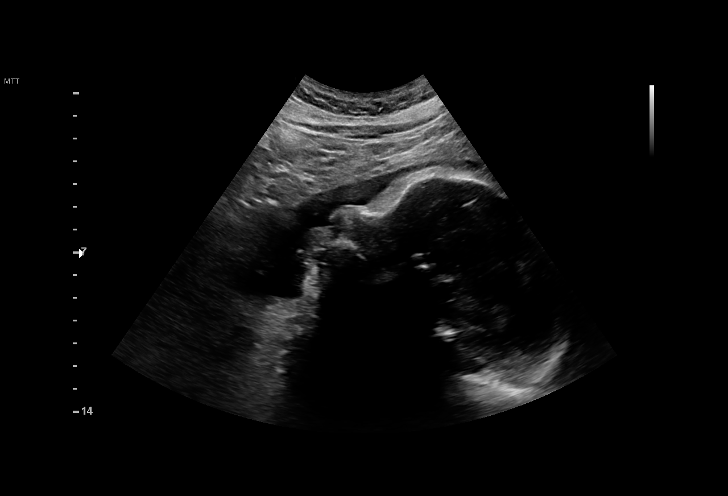

[15 of 28 positions shown; findings below may reference images not displayed]

OB/GYN

Indications

 35 weeks gestation of pregnancy
 Pre-existing diabetes, type 2, in pregnancy,
 third trimester
 Obesity complicating pregnancy, third
 trimester (pregravid BMI 37)
 Previous cesarean delivery, antepartum
 (LTCS)
 Hypertension - Chronic/Pre-existing
 (labetalol)
 History of sickle cell trait
 Heart disease in mother affecting pregnancy
 in third trimester (PDA)
 Encounter for other antenatal screening
 follow-up
Fetal Evaluation

 Num Of Fetuses:         1
 Fetal Heart Rate(bpm):  141
 Cardiac Activity:       Observed
 Presentation:           Cephalic
 Placenta:               Posterior
 P. Cord Insertion:      Previously Visualized
 Amniotic Fluid
 AFI FV:      Within normal limits

 AFI Sum(cm)     %Tile       Largest Pocket(cm)
 13.7            49

 RUQ(cm)       RLQ(cm)       LUQ(cm)        LLQ(cm)

Biophysical Evaluation

 Amniotic F.V:   Pocket => 2 cm             F. Tone:        Observed
 F. Movement:    Observed                   Score:          [DATE]
 F. Breathing:   Observed
OB History

 Gravidity:    2         Term:   1        Prem:   0        SAB:   0
 TOP:          0       Ectopic:  0        Living: 1
Gestational Age

 LMP:           36w 5d        Date:  08/15/20                 EDD:   05/22/21
 Best:          35w 5d     Det. By:  Previous Ultrasound      EDD:   05/29/21
Anatomy

 Ventricles:            Appears normal         Stomach:                Appears normal, left
                                                                       sided
 Thoracic:              Appears normal         Kidneys:                Appear normal
 Heart:                 Appears normal         Bladder:                Appears normal
                        (4CH, axis, and
                        situs)
 Diaphragm:             Appears normal

 Other:  Technically difficult due to advanced GA and fetal position.
Cervix Uterus Adnexa

 Cervix
 Not visualized (advanced GA >59wks)

 Uterus
 No abnormality visualized.

 Right Ovary
 Within normal limits.

 Left Ovary
 Within normal limits.

 Cul De Sac
 No free fluid seen.

 Adnexa
 No abnormality visualized.
Impression

 Antenatal testing performed given maternal T2DM and
 chronic hypertension on therapy.
 The biophysical profile was [DATE] with good fetal movement and
 amniotic fluid volume.
 Blood pressure today is 138/95
Recommendations

 Continue 2x weekly testing.
 Consider delivery by 37-38 weeks.

## 2022-08-05 ENCOUNTER — Other Ambulatory Visit (HOSPITAL_COMMUNITY): Payer: Self-pay

## 2022-08-06 ENCOUNTER — Other Ambulatory Visit (HOSPITAL_COMMUNITY): Payer: Self-pay

## 2022-08-07 ENCOUNTER — Ambulatory Visit (INDEPENDENT_AMBULATORY_CARE_PROVIDER_SITE_OTHER): Payer: 59 | Admitting: Family

## 2022-08-07 ENCOUNTER — Other Ambulatory Visit (HOSPITAL_BASED_OUTPATIENT_CLINIC_OR_DEPARTMENT_OTHER): Payer: Self-pay

## 2022-08-07 ENCOUNTER — Encounter (HOSPITAL_BASED_OUTPATIENT_CLINIC_OR_DEPARTMENT_OTHER): Payer: Self-pay | Admitting: Family

## 2022-08-07 VITALS — BP 170/84 | HR 75 | Ht 61.0 in | Wt 202.0 lb

## 2022-08-07 DIAGNOSIS — Q250 Patent ductus arteriosus: Secondary | ICD-10-CM

## 2022-08-07 DIAGNOSIS — I10 Essential (primary) hypertension: Secondary | ICD-10-CM | POA: Diagnosis not present

## 2022-08-07 DIAGNOSIS — E669 Obesity, unspecified: Secondary | ICD-10-CM

## 2022-08-07 MED ORDER — LABETALOL HCL 200 MG PO TABS
200.0000 mg | ORAL_TABLET | Freq: Two times a day (BID) | ORAL | 2 refills | Status: DC
  Filled 2022-08-07: qty 60, 30d supply, fill #0
  Filled 2022-08-07: qty 50, 25d supply, fill #0
  Filled 2022-08-07: qty 10, 5d supply, fill #0

## 2022-08-07 MED ORDER — AMLODIPINE BESYLATE 10 MG PO TABS
10.0000 mg | ORAL_TABLET | Freq: Every day | ORAL | 1 refills | Status: DC
  Filled 2022-08-07: qty 90, 90d supply, fill #0
  Filled 2023-03-01: qty 90, 90d supply, fill #1

## 2022-08-07 MED ORDER — SPIRONOLACTONE 25 MG PO TABS
25.0000 mg | ORAL_TABLET | Freq: Every day | ORAL | 3 refills | Status: DC
  Filled 2022-08-07: qty 90, 90d supply, fill #0
  Filled 2023-03-01: qty 90, 90d supply, fill #1

## 2022-08-07 NOTE — Patient Instructions (Addendum)
Medication Instructions:   STOP Metoprolol  START Labetolol 200mg  twice daily  CONTINUE Spironolactone 25mg  daily   Labwork: None   Testing/Procedures: MRI as scheduled   Follow-Up: As scheduled in March with Dr.     Special Instructions:  Take blood pressure medications 12 hours apart  Check blood pressure at least one hour after taking.  If blood pressure consistently less than 110/60 or more than 130/80, please let April know.

## 2022-08-07 NOTE — Progress Notes (Signed)
Office Visit    Patient Name: [name] Date of Encounter: 08/07/2022  PCP:  Murlean Iba, NP    [facility]  Cardiologist:  Chilton Si, MD  Advanced Practice Provider:  No care team member to display Electrophysiologist:  None     Chief Complaint    Nicole Stuart is a 31 y.o. female presents today for follow-up of hypertension  Past Medical History    Past Medical History:  Diagnosis Date   Diabetes mellitus type 2 in obese (HCC) 11/02/2020   Dysrhythmia    first degree heart block.    Essential hypertension 11/02/2020   Gestational diabetes    Heart murmur    Hypertension    Patent ductus arteriosus    Sleep apnea    study done two weeks ago [facility]    Past Surgical History:  Procedure Laterality Date   CESAREAN SECTION     CESAREAN SECTION N/A 05/08/2021   Procedure: CESAREAN SECTION;  Surgeon: Maxie Better, MD;  Location: [facility] ORS;  Service: Obstetrics;  Laterality: N/A;   NASAL SEPTOPLASTY W/ TURBINOPLASTY  09/22/2011   Procedure: NASAL SEPTOPLASTY WITH TURBINATE REDUCTION;  Surgeon: Drema Halon, MD;  Location: [facility] OR;  Service: ENT;  Laterality: Bilateral;   TONSILLECTOMY  09/22/2011   Procedure: TONSILLECTOMY;  Surgeon: Drema Halon, MD;  Location: [facility] OR;  Service: ENT;  Laterality: Bilateral;    Allergies  Allergies  Allergen Reactions   Chlorhexidine Rash    History of Present Illness    Nicole Stuart is a 31 y.o. female with a hx of PDA, hypertension, diabetes, sickle cell trait, PCOS last seen 06/26/22  Initial diagnosis of hypertension in 2020 in the setting of pregnancy.  The pregnancy was complicated by preeclampsia and gestational diabetes.  She delivered via 36 weeks.  Echocardiogram 2020 LVEF 60 to 65%, normal RV function, PDA noted.  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  PDA felt to be small and unnecessary for closure and with recommendation for annual follow-up.  SBE prophylaxis was not recommended.  She was referred to the advanced hypertension clinic 11/02/2020 by her OB/GYN Dr. Cherly Hensen due to hypertension during second pregnancy.  She delivered via C-section 04/2021.  Nifedipine previously transition to labetalol.  Spironolactone added to obtain goal of less than 130/80. Seen 05/2021 with headache and Amlodipine added due to hypertension.  Did not tolerate amlodipine 10 mg but was tolerating 5 mg without difficulty.   Last seen 06/26/22 by Dr. Duke Salvia reporting her blood pressure medication was making her feel worse with episodes of hypotension 90/40 and tachycardia with not much activity.   She presents today for follow-up.  Does not feel Metoprolol is controlling her BP with readings consistently 150s-160s. Tachycardia with small amounts of activity overall unchanged. Feels worse than when she was on labetolol. No chest pain, edema, orthopnea, PND.   Previous antihypertensives HCTZ Spironolactone Losartan Labetolol  EKGs/Labs/Other Studies Reviewed:   The following studies were reviewed today:  Nuclear Exercise Stress Test 06/04/2020: Exercise Sestamibi stress test: Exercise nuclear stress test was performed using Bruce protocol. Patient reached 7 METS, and 85% of age predicted maximum heart rate. Exercise capacity was low Chest pain not reported. Normal heart rate response, hypertensive blood pressure response with peak BP 204/84 mmHg. Stress EKG revealed no ischemic changes.  Normal myocardial perfusion. Stress LVEF 58%. Low risk study.    Echo 02/23/19 [facility]): Summary  There is no prior echocardiogram for comparison. Subcostal images not obtained  due to pregnancy. A patent ductus arteriousus is suspected.  Tricuspid valve is structurally normal.  No evidence of tricuspid stenosis.  Trace tricuspid regurgitation.  Pulmonic  valve is structurally normal.  No evidence of pulmonic stenosis.  Trace pulmonic regurgitation.  Normal left ventricular size and systolic function with no appreciable  segmental abnormality.  Ejection fraction is visually estimated at 60-65%.  Diastolic function is normal.  Normal left ventricular wall thickness.  Normal size right atrium.  Normal right ventricular size and function.  The aortic root diameter is within normal limits.  The IVC was not visualized ([redacted] weeks gestation).  A patent ductus arteriosus is suspected.  The interatrial septum appears intact by color flow Doppler.    Echocardiogram 07/01/2021 IMPRESSIONS     1. Left ventricular ejection fraction, by estimation, is 55 to 60%. The  left ventricle has normal function. The left ventricle has no regional  wall motion abnormalities. Left ventricular diastolic parameters were  normal.   2. Right ventricular systolic function is normal. The right ventricular  size is normal.   3. The mitral valve is normal in structure. Trivial mitral valve  regurgitation.   4. The aortic valve is normal in structure. Aortic valve regurgitation is  not visualized.   5. Tubulent flow thorugh PA but no definitive PDA.   6. The inferior vena cava is normal in size with greater than 50%  respiratory variability, suggesting right atrial pressure of 3 mmHg.  EKG: EKG ordered today. EKG performed today demonstrates NSR 75 bpm with no acute ST/T wave changes.   Recent Labs: No results found for requested labs within last 365 days.  Recent Lipid Panel No results found for: "CHOL", "TRIG", "HDL", "CHOLHDL", "VLDL", "LDLCALC", "LDLDIRECT"   Home Medications   Current Meds  Medication Sig   Continuous Blood Gluc Sensor (DEXCOM G7 SENSOR) MISC Use 1 sensor every 10 days   Continuous Blood Gluc Sensor (DEXCOM G7 SENSOR) MISC Change sensor every 10 days   docusate sodium (COLACE) 100 MG capsule Take 100 mg by mouth 2 (two) times daily as  needed for mild constipation.   ibuprofen (ADVIL) 600 MG tablet Take 1 tablet by mouth every 6 hours as needed.   labetalol (NORMODYNE) 200 MG tablet Take 1 tablet (200 mg total) by mouth 2 (two) times daily.   Lactic Ac-Citric Ac-Pot Bitart (PHEXXI) 1.8-1-0.4 % GEL Place 5 g vaginally as needed.   lidocaine (XYLOCAINE) 2 % jelly Apply 1 Application topically as needed. Apply to cervix 30 min before procedure   LORazepam (ATIVAN) 0.5 MG tablet Take 1-2 tablets by mouth 30 minutes before procedure. May take the third tablet if needed.   metFORMIN (GLUCOPHAGE-XR) 500 MG 24 hr tablet Take 2 tablets by mouth twice a day   metFORMIN (GLUCOPHAGE-XR) 500 MG 24 hr tablet Take 1 tablet (500 mg total) by mouth 2 (two) times daily.   Prenatal Vit-Fe Fumarate-FA (PRENATAL VITAMIN PO) Take 1 tablet by mouth in the morning.   tirzepatide (MOUNJARO) 5 MG/0.5ML Pen Inject 1 pen (5 MG) into skin once weekly 30 days   tirzepatide [facility]) 5 MG/0.5ML Pen Inject 5 mg into the skin once a week.   [DISCONTINUED] amLODipine (NORVASC) 10 MG tablet Take 1 tablet (10 mg total) by mouth daily.   [DISCONTINUED] metoprolol succinate (TOPROL-XL) 50 MG  24 hr tablet Take 1 tablet (50 mg total) by mouth daily. Take with or immediately following a meal.     Review of Systems      All other systems reviewed and are otherwise negative except as noted above.  Physical Exam    VS:  BP (!) 170/84   Pulse 75   Ht 5\' 1"  (1.549 m)   Wt 202 lb (91.6 kg)   BMI 38.17 kg/m  , BMI Body mass index is 38.17 kg/m.  Wt Readings from Last 3 Encounters:  08/07/22 202 lb (91.6 kg)  06/26/22 195 lb (88.5 kg)  06/18/22 196 lb (88.9 kg)     GEN: Well nourished, overweight, well developed, in no acute distress. HEENT: normal. Neck: Supple, no JVD, carotid bruits, or masses. Cardiac: RRR, no murmurs, rubs, or gallops. No clubbing, cyanosis, edema.  Radials/PT 2+ and equal bilaterally.  Respiratory:  Respirations regular and  unlabored, clear to auscultation bilaterally. GI: Soft, nontender, nondistended. MS: No deformity or atrophy. Skin: Warm and dry, no rash. Neuro:  Strength and sensation are intact. Psych: Normal affect.  Assessment & Plan     HTN - BP not at goal <130/80. Is not tolerating metoprolol, will discontinue. Continue Amlodipine, Spironolactone. Will resume Labetolol at lower dose of 200mg  BID. Discussed to monitor BP at home at least 2 hours after medications and sitting for 5-10 minutes.   PDA -echo 06/2021 LVEF 55 to 60%, normal diastolic parameters, trivial MR, turbulent flow through PDA but no definitive PDA.  She is pending cardiac MRI for further evaluation of exertional dyspnea/tachycardia.  Not recommended for SBE prophylaxis.  Continue to monitor with periodic echocardiogram.  Obesity - Weight loss via diet and exercise encouraged. Discussed the impact being overweight would have on cardiovascular risk.   Disposition: Follow up  in March 2024 as scheduled  with 07/2021, MD   Signed, April 2024, NP 08/07/2022, 4:29 PM [facility]

## 2022-08-08 ENCOUNTER — Other Ambulatory Visit (HOSPITAL_BASED_OUTPATIENT_CLINIC_OR_DEPARTMENT_OTHER): Payer: Self-pay

## 2022-08-10 ENCOUNTER — Encounter (HOSPITAL_BASED_OUTPATIENT_CLINIC_OR_DEPARTMENT_OTHER): Payer: Self-pay | Admitting: Family

## 2022-08-11 IMAGING — US US MFM OB FOLLOW-UP
1 series · 14 of 28 positions shown · non-contrast
Comparison: none

[Series 1: us mfm ob follow-up · 36 acquisitions, 14 frames shown]
[im 2/36]
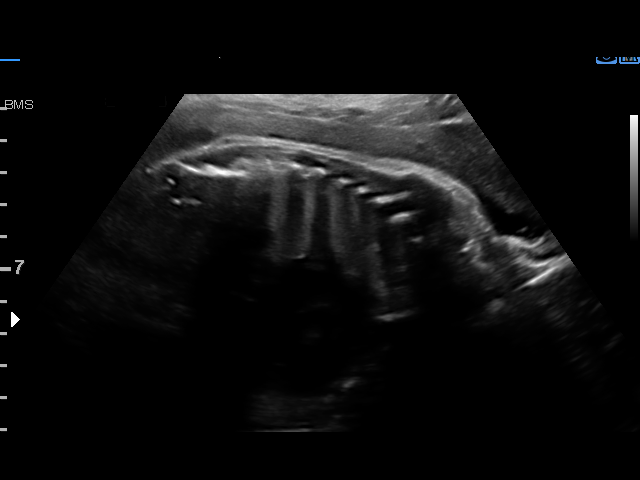
[im 4/36]
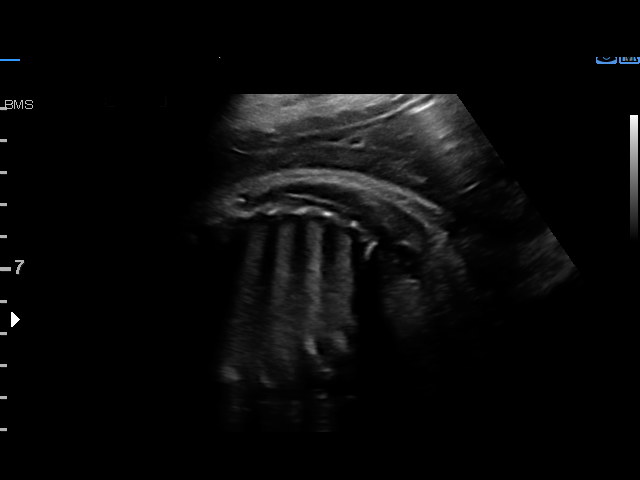
[im 7/36]
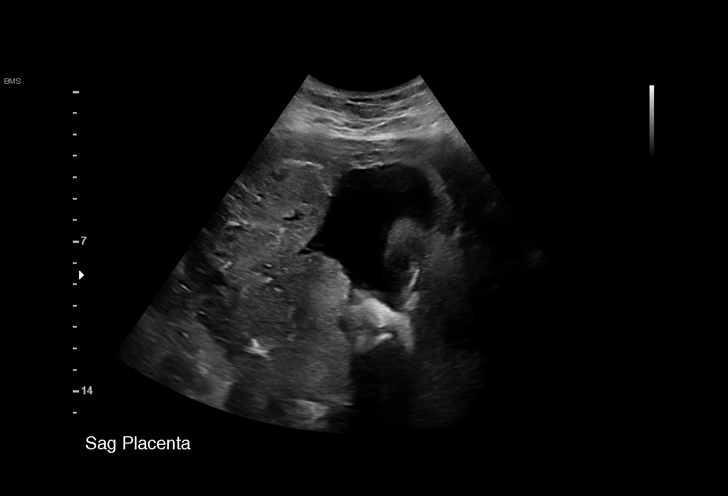
[im 10/36]
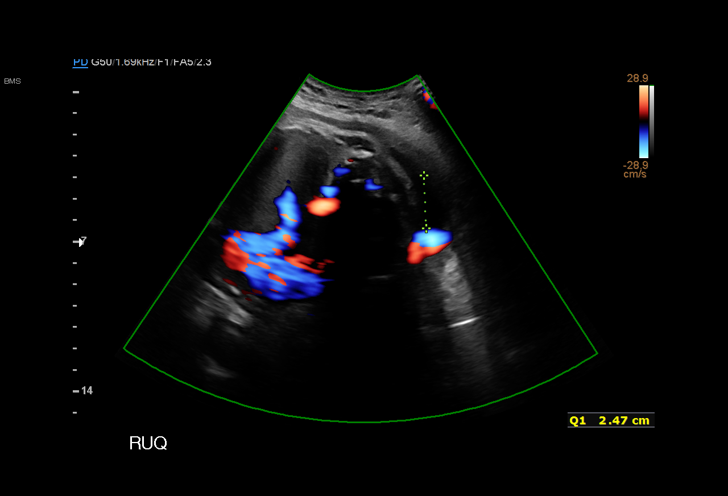
[im 12/36]
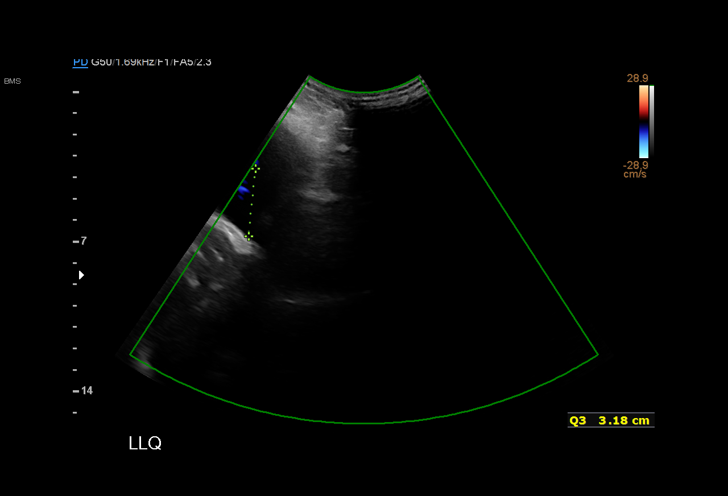
[im 15/36]
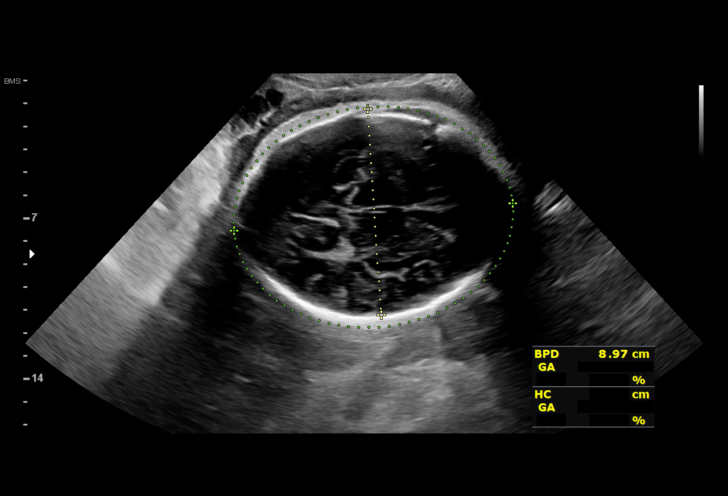
[im 17/36]
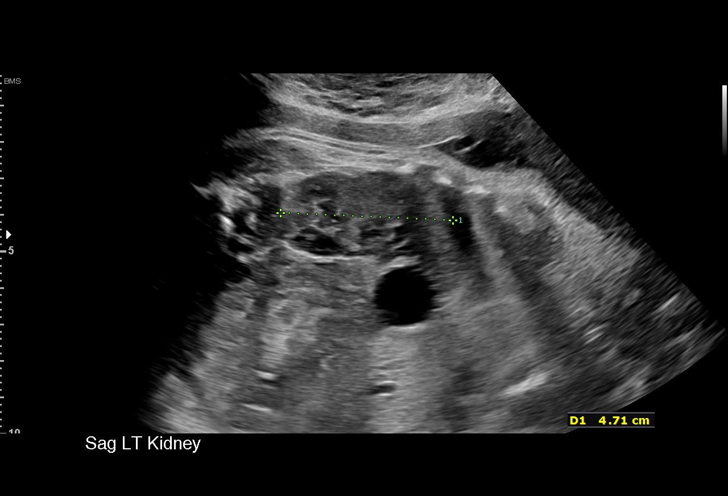
[im 20/36]
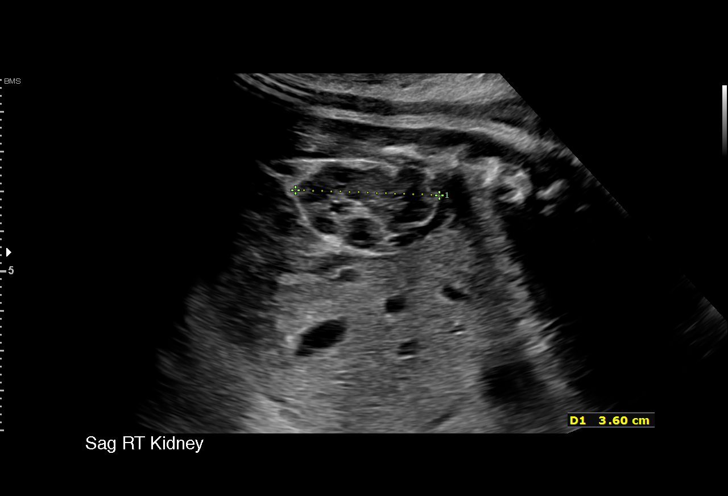
[im 23/36]
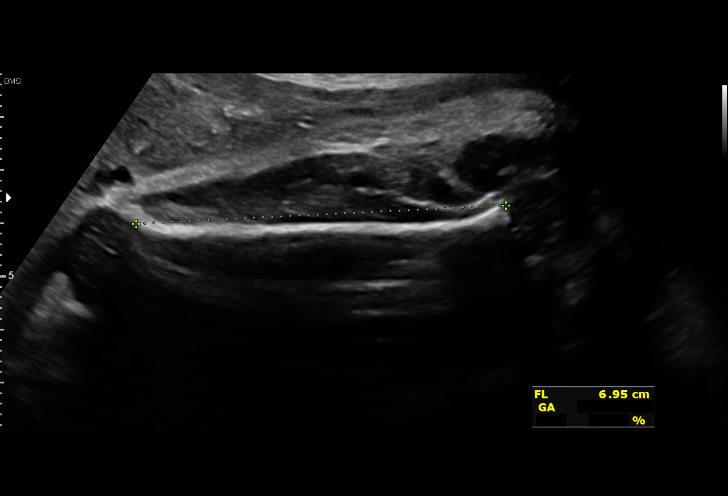
[im 25/36]
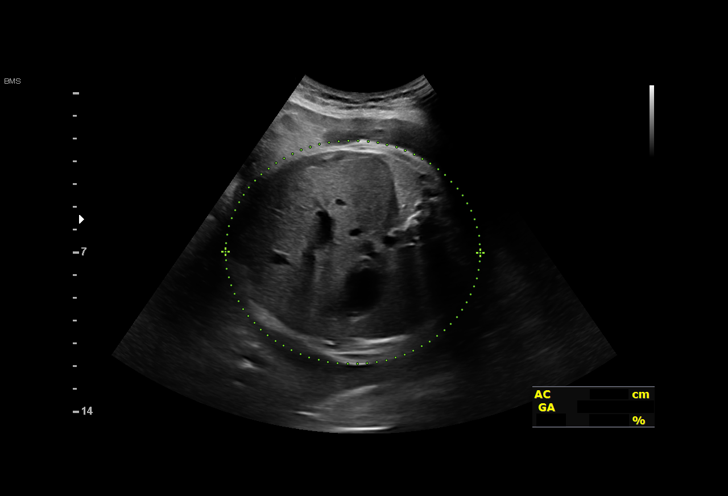
[im 28/36]
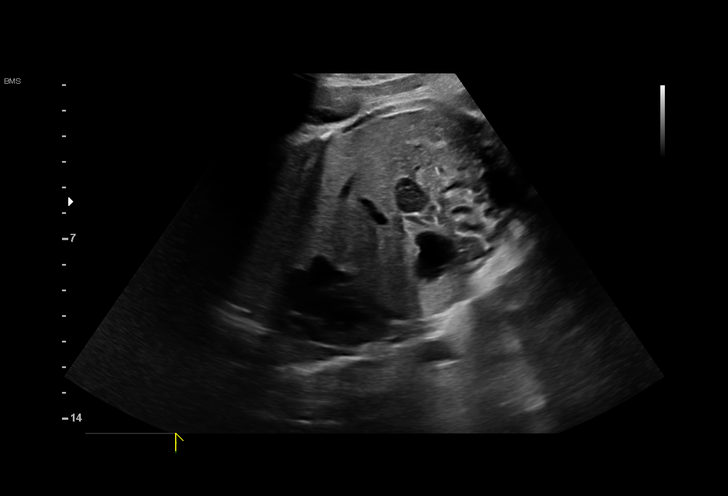
[im 30/36]
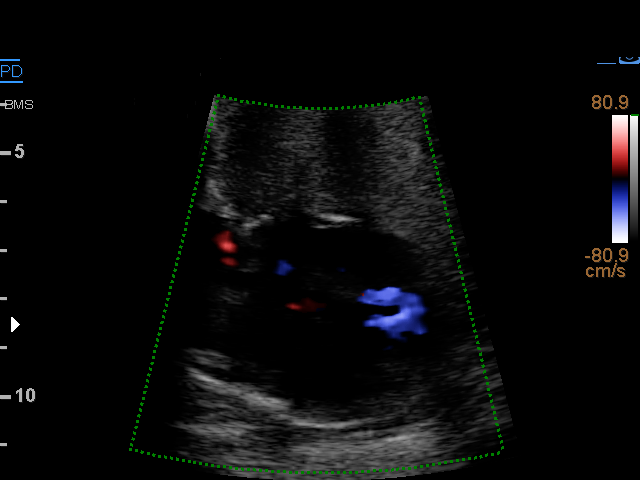
[im 33/36]
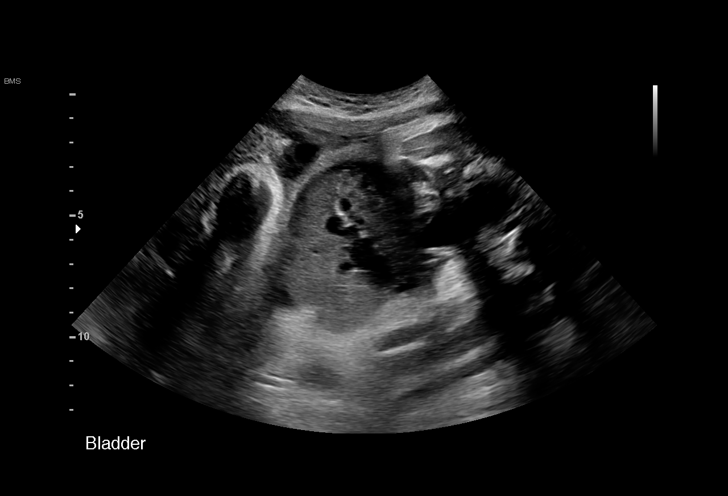
[im 36/36]
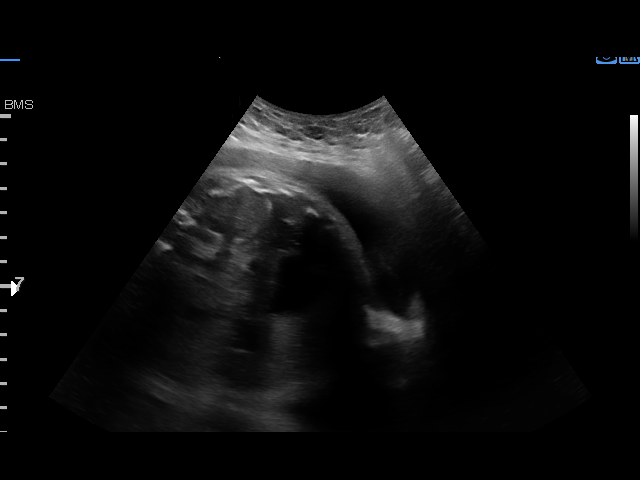

[14 of 28 positions shown; findings below may reference images not displayed]

OB/GYN

Indications

 Pre-existing diabetes, type 2, in pregnancy,
 third trimester
 Obesity complicating pregnancy, third
 trimester (pregravid BMI 37)
 Previous cesarean delivery, antepartum
 (LTCS)
 Hypertension - Chronic/Pre-existing
 (labetalol)
 History of sickle cell trait
 Heart disease in mother affecting pregnancy
 in third trimester (PDA)
 Encounter for other antenatal screening
 follow-up
 36 weeks gestation of pregnancy
Fetal Evaluation

 Num Of Fetuses:         1
 Fetal Heart Rate(bpm):  158
 Cardiac Activity:       Observed
 Presentation:           Cephalic
 Placenta:               Posterior
 P. Cord Insertion:      Previously Visualized
 Amniotic Fluid
 AFI FV:      Within normal limits

 AFI Sum(cm)     %Tile       Largest Pocket(cm)
 13.74           51

 RUQ(cm)       RLQ(cm)       LUQ(cm)        LLQ(cm)

Biophysical Evaluation

 Amniotic F.V:   Pocket => 2 cm             F. Tone:        Observed
 F. Movement:    Observed                   Score:          [DATE]
 F. Breathing:   Observed
Biometry

 BPD:      89.4  mm     G. Age:  36w 1d         48  %    CI:        70.69   %    70 - 86
                                                         FL/HC:      20.5   %    20.8 -
 HC:      338.9  mm     G. Age:  39w 0d         76  %    HC/AC:      1.05        0.92 -
 AC:      323.3  mm     G. Age:  36w 2d         49  %    FL/BPD:     77.7   %    71 - 87
 FL:       69.5  mm     G. Age:  35w 5d         22  %    FL/AC:      21.5   %    20 - 24

 Est. FW:    7170  gm      6 lb 7 oz     45  %
OB History

 Gravidity:    2         Term:   1        Prem:   0        SAB:   0
 TOP:          0       Ectopic:  0        Living: 1
Gestational Age

 LMP:           37w 5d        Date:  08/15/20                 EDD:   05/22/21
 U/S Today:     36w 6d                                        EDD:   05/28/21
 Best:          36w 5d     Det. By:  Previous Ultrasound      EDD:   05/29/21
Anatomy

 Cranium:               Appears normal         LVOT:                   Previously seen
 Cavum:                 Appears normal         Aortic Arch:            Previously seen
 Ventricles:            Appears normal         Ductal Arch:            Not well visualized
 Choroid Plexus:        Previously seen        Diaphragm:              Previously seen
 Cerebellum:            Previously seen        Stomach:                Appears normal, left
                                                                       sided
 Posterior Fossa:       Previously seen        Abdomen:                Appears normal
 Nuchal Fold:           Previously seen        Abdominal Wall:         Previously seen
 Face:                  Orbits and profile     Cord Vessels:           Previously seen
                        previously seen
 Lips:                  Previously seen        Kidneys:                Appear normal
 Palate:                Previously seen        Bladder:                Appears normal
 Thoracic:              Appears normal         Spine:                  Previously seen
 Heart:                 Previously seen        Upper Extremities:      Previously seen
 RVOT:                  Previously seen        Lower Extremities:      Previously seen

 Other:  Heels/feet and open hands/5th digits previously visualized. Nasal
         bone and lenses previously visualized. 3VV previously visualized.
         Technically difficult due to maternal habitus and fetal position.
Cervix Uterus Adnexa
 Cervix
 Not visualized (advanced GA >25wks)
Comments

 This patient was seen for a biophysical profile and growth
 scan due to uncontrolled gestational diabetes.  She denies
 any problems since her last exam.
 The fetal growth and amniotic fluid level appeared
 appropriate for her gestational age today.
 A biophysical profile performed today was [DATE].
 Due to poorly controlled gestational diabetes, she is already
 scheduled for delivery in two days at 37 weeks.

## 2022-08-19 ENCOUNTER — Encounter (HOSPITAL_COMMUNITY): Payer: Self-pay

## 2022-08-19 ENCOUNTER — Other Ambulatory Visit: Payer: Self-pay

## 2022-08-19 ENCOUNTER — Other Ambulatory Visit (HOSPITAL_COMMUNITY): Payer: Self-pay

## 2022-08-21 ENCOUNTER — Other Ambulatory Visit (HOSPITAL_COMMUNITY): Payer: Self-pay

## 2022-08-21 DIAGNOSIS — E1169 Type 2 diabetes mellitus with other specified complication: Secondary | ICD-10-CM | POA: Diagnosis not present

## 2022-08-21 DIAGNOSIS — I10 Essential (primary) hypertension: Secondary | ICD-10-CM | POA: Diagnosis not present

## 2022-08-21 DIAGNOSIS — E1165 Type 2 diabetes mellitus with hyperglycemia: Secondary | ICD-10-CM | POA: Diagnosis not present

## 2022-08-21 MED ORDER — MOUNJARO 7.5 MG/0.5ML ~~LOC~~ SOAJ
7.5000 mg | SUBCUTANEOUS | 1 refills | Status: DC
  Filled 2022-08-21 – 2022-09-22 (×3): qty 2, 28d supply, fill #0
  Filled 2022-10-31: qty 2, 28d supply, fill #1

## 2022-08-22 DIAGNOSIS — E1165 Type 2 diabetes mellitus with hyperglycemia: Secondary | ICD-10-CM | POA: Diagnosis not present

## 2022-08-22 LAB — HEPATIC FUNCTION PANEL
ALT: 14 U/L (ref 7–35)
AST: 14 (ref 13–35)
Alkaline Phosphatase: 140 — AB (ref 25–125)

## 2022-08-22 LAB — BASIC METABOLIC PANEL
BUN: 9 (ref 4–21)
CO2: 21 (ref 13–22)
Chloride: 105 (ref 99–108)
Creatinine: 0.6 (ref 0.5–1.1)
Glucose: 81
Potassium: 4.5 mEq/L (ref 3.5–5.1)
Sodium: 142 (ref 137–147)

## 2022-08-22 LAB — MICROALBUMIN / CREATININE URINE RATIO: Microalb Creat Ratio: 15

## 2022-08-22 LAB — COMPREHENSIVE METABOLIC PANEL
Albumin: 4.4 (ref 3.5–5.0)
Calcium: 9.8 (ref 8.7–10.7)
eGFR: 124

## 2022-08-23 ENCOUNTER — Other Ambulatory Visit (HOSPITAL_COMMUNITY): Payer: Self-pay

## 2022-08-25 ENCOUNTER — Other Ambulatory Visit (HOSPITAL_COMMUNITY): Payer: Self-pay

## 2022-08-26 ENCOUNTER — Other Ambulatory Visit (HOSPITAL_BASED_OUTPATIENT_CLINIC_OR_DEPARTMENT_OTHER): Payer: Self-pay

## 2022-09-04 ENCOUNTER — Other Ambulatory Visit (HOSPITAL_COMMUNITY): Payer: Self-pay

## 2022-09-04 MED ORDER — CHLORHEXIDINE GLUCONATE 0.12 % MT SOLN
OROMUCOSAL | 0 refills | Status: DC
  Filled 2022-09-04: qty 473, 15d supply, fill #0

## 2022-09-04 MED ORDER — IBUPROFEN 800 MG PO TABS
800.0000 mg | ORAL_TABLET | Freq: Three times a day (TID) | ORAL | 1 refills | Status: AC | PRN
  Filled 2022-09-04: qty 20, 10d supply, fill #0
  Filled 2022-09-04: qty 20, 7d supply, fill #0
  Filled 2023-03-01: qty 20, 7d supply, fill #1

## 2022-09-04 MED ORDER — AMOXICILLIN 500 MG PO CAPS
500.0000 mg | ORAL_CAPSULE | Freq: Three times a day (TID) | ORAL | 0 refills | Status: DC
  Filled 2022-09-04 (×2): qty 21, 7d supply, fill #0

## 2022-09-22 ENCOUNTER — Other Ambulatory Visit: Payer: Self-pay

## 2022-09-22 ENCOUNTER — Other Ambulatory Visit (HOSPITAL_COMMUNITY): Payer: Self-pay

## 2022-09-25 DIAGNOSIS — M5386 Other specified dorsopathies, lumbar region: Secondary | ICD-10-CM | POA: Diagnosis not present

## 2022-09-25 DIAGNOSIS — M9903 Segmental and somatic dysfunction of lumbar region: Secondary | ICD-10-CM | POA: Diagnosis not present

## 2022-09-25 DIAGNOSIS — M9902 Segmental and somatic dysfunction of thoracic region: Secondary | ICD-10-CM | POA: Diagnosis not present

## 2022-09-25 DIAGNOSIS — M9901 Segmental and somatic dysfunction of cervical region: Secondary | ICD-10-CM | POA: Diagnosis not present

## 2022-09-25 DIAGNOSIS — M9904 Segmental and somatic dysfunction of sacral region: Secondary | ICD-10-CM | POA: Diagnosis not present

## 2022-09-30 ENCOUNTER — Ambulatory Visit (HOSPITAL_BASED_OUTPATIENT_CLINIC_OR_DEPARTMENT_OTHER): Payer: Commercial Managed Care - PPO | Admitting: Family Medicine

## 2022-10-03 ENCOUNTER — Other Ambulatory Visit (HOSPITAL_COMMUNITY): Payer: Self-pay

## 2022-10-03 ENCOUNTER — Telehealth (HOSPITAL_COMMUNITY): Payer: Self-pay | Admitting: Emergency Medicine

## 2022-10-03 NOTE — Telephone Encounter (Signed)
Attempted to call patient regarding upcoming cardiac CT appointment. °Left message on voicemail with name and callback number °Mckaela Howley RN Navigator Cardiac Imaging °[facility] °[phone] Office °[phone] Cell ° °

## 2022-10-07 ENCOUNTER — Ambulatory Visit (HOSPITAL_COMMUNITY): Payer: 59

## 2022-10-15 ENCOUNTER — Telehealth: Payer: 59 | Admitting: Nurse Practitioner

## 2022-10-15 ENCOUNTER — Other Ambulatory Visit (HOSPITAL_COMMUNITY): Payer: Self-pay

## 2022-10-15 ENCOUNTER — Other Ambulatory Visit: Payer: Self-pay

## 2022-10-15 DIAGNOSIS — M545 Low back pain, unspecified: Secondary | ICD-10-CM

## 2022-10-15 MED ORDER — NAPROXEN 500 MG PO TABS
500.0000 mg | ORAL_TABLET | Freq: Two times a day (BID) | ORAL | 1 refills | Status: AC
  Filled 2022-10-15: qty 60, 30d supply, fill #0
  Filled 2023-03-01: qty 60, 30d supply, fill #1

## 2022-10-15 MED ORDER — CYCLOBENZAPRINE HCL 10 MG PO TABS
10.0000 mg | ORAL_TABLET | Freq: Three times a day (TID) | ORAL | 1 refills | Status: AC | PRN
  Filled 2022-10-15: qty 30, 10d supply, fill #0
  Filled 2023-03-01: qty 30, 10d supply, fill #1

## 2022-10-15 NOTE — Progress Notes (Signed)

## 2022-10-31 ENCOUNTER — Other Ambulatory Visit (HOSPITAL_COMMUNITY): Payer: Self-pay

## 2022-11-05 ENCOUNTER — Ambulatory Visit (HOSPITAL_BASED_OUTPATIENT_CLINIC_OR_DEPARTMENT_OTHER): Payer: Commercial Managed Care - PPO | Admitting: Cardiovascular Disease

## 2022-11-05 NOTE — Progress Notes (Incomplete)
Advanced Hypertension Clinic Follow-up:    Date:  11/05/2022   ID:  Nicole Stuart, DOB 03/16/1991, MRN [id number]  PCP:  Enzo Bi, NP  Cardiologist:  Skeet Latch, MD  Congenital cardiologist: Dr. Norman Clay, [facility]  Referring MD: Enzo Bi, NP   CC: Hypertension  History of Present Illness:    Nicole Stuart is a 32 y.o. female with a hx of PDA, hypertension, diabetes, sickle cell trait, and PCOS here for follow-up. She was initially seen in the advanced hypertension clinic 11/02/2020. She was seen by Dr. Garwin Brothers on 3/4 for confirmation of pregnancy.  She was started on nifedipine 30mg  and her spironolactone and HCTZ were disconitnued.   She was first diagnosed with hypertension in 2020.  It occurred during pregnancy.  The pregnancy was complicated by preeclampsia and gestational diabetes.  After delivery both hypertension and diabetes persisted.  She notes that initially in the pregnancy her blood pressure was low but it became increasingly elevated.  She ultimately delivered via cesarean section at 36 weeks.  Her OB/GYN noted that she had a murmur.  She reported that her murmur was longstanding since childhood.   She had an echo in 2020 that revealed LVEF 60 to 65% with normal right ventricular function.  A PDA was noted.  She saw pediatric cardiology at [facility] and given that she was asymptomatic with normal left ventricular function and no evidence of chamber dilation, she was thought to be low risk for vaginal delivery.  Her PDA was thought to be too small and it was unnecessary to close.  Given her low SVR they thought it was unlikely that she would go into heart failure during her pregnancy and recommended annual follow-up.  They did not feel that she needed SBE prophylaxis.  She is also previously been seen by [facility] cardiology for chest pain and had stress test that was unremarkable.  They have also been assisting with her blood pressure management.  She had tachycardia  to the 130's with her last pregnancy. Nifedipine was stopped and she was switched to Labetalol. She delivered her baby via cesarean section 04/2021. After delivery her blood pressure increased and her LE edema recurred. Spirolactone was restarted and labetalol was increased. She had an Echo 06/2021 which revealed LVEF 55-60% with normal diastolic function, right atrial pressure was three, there was turbulent flow in the PA, but no definite PDA. She followed up with Darliss Ridgel NP, and blood pressures were better controlled, but she showed with exertional dyspnea.   At her last appointment blood pressures were much better controlled. She also had low blood pressures since reducing her labetalol to once daily. She struggled with tachycardia when trying to exercise. We switched her labetalol to metoprolol succinate 50 mg daily. A cardiac MRI was ordered to assess for PDA. She followed up with Laurann Montana, NP 08/07/2022 and was feeling worse on metoprolol. Blood pressure readings were consistently in the 0000000 systolic and her tachycardic episodes were overall unchanged. Metoprolol was discontinued. Labetalol was resumed at a lower dose of 200 mg BID. Today, the patient states that she has been feeling good recently. She has been getting exercise two days a week. She struggles to tolerate a lot of exercise without experiencing tachycardia of 180 bpm and SOB. She has noticed these exertional symptoms both during and after her pregnancy and feels unable to keep up with other people in her life. She states that she has lost weight recently. Her blood pressure  medicine has been making her feel worse; notably she had measured her pressure at 90/40 during one of these episodes of lightheadedness. She denies any palpitations, chest pain, or peripheral edema. No headaches, syncope, orthopnea, or PND.   Today,  She denies any palpitations, chest pain, shortness of breath, or peripheral edema. No lightheadedness,  headaches, syncope, orthopnea, or PND.  (+)  Previous antihypertensives: HCTZ Spironolactone Losartan   Past Medical History:  Diagnosis Date   Diabetes mellitus type 2 in obese ([place]) 11/02/2020   Dysrhythmia    first degree heart block.    Essential hypertension 11/02/2020   Gestational diabetes    Heart murmur    Hypertension    Patent ductus arteriosus    Sleep apnea    study done two weeks ago [place]     Past Surgical History:  Procedure Laterality Date   CESAREAN SECTION     CESAREAN SECTION N/A 05/08/2021   Procedure: CESAREAN SECTION;  Surgeon: Servando Salina, MD;  Location: [facility];  Service: Obstetrics;  Laterality: N/A;   NASAL SEPTOPLASTY W/ TURBINOPLASTY  09/22/2011   Procedure: NASAL SEPTOPLASTY WITH TURBINATE REDUCTION;  Surgeon: Rozetta Nunnery, MD;  Location: [place];  Service: ENT;  Laterality: Bilateral;   TONSILLECTOMY  09/22/2011   Procedure: TONSILLECTOMY;  Surgeon: Rozetta Nunnery, MD;  Location: [place];  Service: ENT;  Laterality: Bilateral;    Current Medications: No outpatient medications have been marked as taking for the 11/05/22 encounter (Appointment) with Skeet Latch, MD.     Allergies:   Chlorhexidine   Social History   Socioeconomic History   Marital status: Married    Spouse name: Not on file   Number of children: Not on file   Years of education: Not on file   Highest education level: Not on file  Occupational History   Not on file  Tobacco Use   Smoking status: Never   Smokeless tobacco: Never  Vaping Use   Vaping Use: Never used  Substance and Sexual Activity   Alcohol use: No   Drug use: No   Sexual activity: Yes  Other Topics Concern   Not on file  Social History Narrative   Pt is L&D RN at [employer]   Social Determinants of Health   Financial Resource Strain: Not on file  Food Insecurity: No Food Insecurity (11/02/2020)   Hunger Vital Sign    Worried About Running Out of Food in the Last Year:  Never true    Ran Out of Food in the Last Year: Never true  Transportation Needs: No Transportation Needs (11/02/2020)   PRAPARE - Hydrologist (Medical): No    Lack of Transportation (Non-Medical): No  Physical Activity: Insufficiently Active (11/02/2020)   Exercise Vital Sign    Days of Exercise per Week: 2 days    Minutes of Exercise per Session: 60 min  Stress: No Stress Concern Present (11/02/2020)   [place]    Feeling of Stress : Not at all  Social Connections: Not on file     Family History: The patient's family history includes Diabetes in her mother; Hypertension in her brother and sister; Obesity in her mother. She was adopted.  ROS:   Please see the history of present illness.  All other systems reviewed and are negative.  EKGs/Labs/Other Studies Reviewed:    EKG:  The EKG is personally reviewed 11/05/2022:  EKG was not ordered. 06/26/2022:  EKG was not ordered. 05/15/2021: Sinus rhythm. Rate 74 bpm. 11/02/2020: sinus rhythm/sinus arrhythmia.  Rate 86 bpm.  Echocardiogram  07/01/2021: Sonographer Comments: Suboptimal parasternal window and patient is  morbidly obese. Image acquisition challenging due to patient body habitus  and patient currently breastfeeding.   IMPRESSIONS   1. Left ventricular ejection fraction, by estimation, is 55 to 60%. The  left ventricle has normal function. The left ventricle has no regional  wall motion abnormalities. Left ventricular diastolic parameters were  normal.   2. Right ventricular systolic function is normal. The right ventricular  size is normal.   3. The mitral valve is normal in structure. Trivial mitral valve  regurgitation.   4. The aortic valve is normal in structure. Aortic valve regurgitation is  not visualized.   5. Tubulent flow thorugh PA but no definitive PDA.   6. The inferior vena cava is normal in size with greater  than 50%  respiratory variability, suggesting right atrial pressure of 3 mmHg.   Nuclear Exercise Stress Test 06/04/2020: Exercise Sestamibi stress test: Exercise nuclear stress test was performed using Bruce protocol. Patient reached 7 METS, and 85% of age predicted maximum heart rate. Exercise capacity was low Chest pain not reported. Normal heart rate response, hypertensive blood pressure response with peak BP 204/84 mmHg. Stress EKG revealed no ischemic changes. Normal myocardial perfusion. Stress LVEF 58%. Low risk study.   Echo 02/23/19 [facility]): Summary  There is no prior echocardiogram for comparison. Subcostal images not obtained  due to pregnancy. A patent ductus arteriousus is suspected.  Tricuspid valve is structurally normal.  No evidence of tricuspid stenosis.  Trace tricuspid regurgitation.  Pulmonic valve is structurally normal.  No evidence of pulmonic stenosis.  Trace pulmonic regurgitation.  Normal left ventricular size and systolic function with no appreciable  segmental abnormality.  Ejection fraction is visually estimated at 60-65%.  Diastolic function is normal.  Normal left ventricular wall thickness.  Normal size right atrium.  Normal right ventricular size and function.  The aortic root diameter is within normal limits.  The IVC was not visualized ([redacted] weeks gestation).  A patent ductus arteriosus is suspected.  The interatrial septum appears intact by color flow Doppler.    Recent Labs: No results found for requested labs within last 365 days.   Recent Lipid Panel No results found for: "CHOL", "TRIG", "HDL", "CHOLHDL", "VLDL", "LDLCALC", "LDLDIRECT"  Physical Exam:    VS:  There were no vitals taken for this visit. , BMI There is no height or weight on file to calculate BMI. GENERAL:  Well appearing HEENT: Pupils equal round and reactive, fundi not visualized, oral mucosa unremarkable NECK:  No jugular venous distention, waveform within normal limits,  carotid upstroke brisk and symmetric, no bruits LUNGS:  Clear to auscultation bilaterally HEART:  RRR.  PMI not displaced or sustained,S1 and S2 within normal limits, no S3, no S4, no clicks, no rubs, no murmur ABD:  Positive bowel sounds normal in frequency in pitch, no bruits, no rebound, no guarding, no midline pulsatile mass, no hepatomegaly, no splenomegaly EXT:  2 plus pulses throughout, ***trace bilateral LE edema, no cyanosis no clubbing SKIN:  No rashes no nodules NEURO:  Cranial nerves II through XII grossly intact, motor grossly intact throughout PSYCH:  Cognitively intact, oriented to person place and time   ASSESSMENT:    No diagnosis found.  PLAN:    No problem-specific Assessment & Plan notes found for this encounter.  ***Plan: -  Disposition: FU  with PharmD in 1 month. FU with Tiffany C. Oval Linsey, MD, [facility] in ***4 months.  Medication Adjustments/Labs and Tests Ordered: Current medicines are reviewed at length with the patient today.  Concerns regarding medicines are outlined above.   No orders of the defined types were placed in this encounter.  No orders of the defined types were placed in this encounter.  I,Mathew Stumpf,acting as a Education administrator for Skeet Latch, MD.,have documented all relevant documentation on the behalf of Skeet Latch, MD,as directed by  Skeet Latch, MD while in the presence of Skeet Latch, MD.  I, [place] Oval Linsey, MD have reviewed all documentation for this visit.  The documentation of the exam, diagnosis, procedures, and orders on 11/05/2022 are all accurate and complete.  Waynetta Pean  11/05/2022 7:33 AM    [facility]

## 2022-12-23 ENCOUNTER — Other Ambulatory Visit (HOSPITAL_COMMUNITY): Payer: Self-pay

## 2022-12-23 DIAGNOSIS — E785 Hyperlipidemia, unspecified: Secondary | ICD-10-CM | POA: Diagnosis not present

## 2022-12-23 DIAGNOSIS — I10 Essential (primary) hypertension: Secondary | ICD-10-CM | POA: Diagnosis not present

## 2022-12-23 DIAGNOSIS — E1165 Type 2 diabetes mellitus with hyperglycemia: Secondary | ICD-10-CM | POA: Diagnosis not present

## 2022-12-23 MED ORDER — OZEMPIC (0.25 OR 0.5 MG/DOSE) 2 MG/3ML ~~LOC~~ SOPN
0.2500 mg | PEN_INJECTOR | SUBCUTANEOUS | 3 refills | Status: DC
  Filled 2022-12-23: qty 3, 30d supply, fill #0
  Filled 2023-03-01: qty 3, 30d supply, fill #1
  Filled 2023-03-27: qty 3, 28d supply, fill #2

## 2022-12-24 ENCOUNTER — Other Ambulatory Visit (HOSPITAL_COMMUNITY): Payer: Self-pay

## 2022-12-24 LAB — LIPID PANEL
Cholesterol: 183 (ref 0–200)
HDL: 42 (ref 35–70)
LDL Cholesterol: 128
Triglycerides: 67 (ref 40–160)

## 2022-12-24 LAB — HEMOGLOBIN A1C: Hemoglobin A1C: 6.4

## 2022-12-25 ENCOUNTER — Other Ambulatory Visit: Payer: Self-pay

## 2022-12-25 ENCOUNTER — Other Ambulatory Visit (HOSPITAL_COMMUNITY): Payer: Self-pay

## 2022-12-25 MED ORDER — FREESTYLE LITE W/DEVICE KIT
PACK | 0 refills | Status: AC
  Filled 2022-12-25: qty 1, 30d supply, fill #0

## 2022-12-25 MED ORDER — BLOOD GLUCOSE TEST VI STRP
ORAL_STRIP | 5 refills | Status: AC
  Filled 2022-12-25: qty 100, 50d supply, fill #0
  Filled 2023-03-01: qty 100, 50d supply, fill #1

## 2022-12-25 MED ORDER — FREESTYLE LANCETS MISC
5 refills | Status: AC
  Filled 2022-12-25: qty 100, 50d supply, fill #0

## 2023-01-08 ENCOUNTER — Encounter: Payer: Self-pay | Admitting: Nurse Practitioner

## 2023-01-08 ENCOUNTER — Ambulatory Visit (INDEPENDENT_AMBULATORY_CARE_PROVIDER_SITE_OTHER): Payer: 59 | Admitting: Nurse Practitioner

## 2023-01-08 VITALS — BP 122/80 | HR 81 | Temp 97.1°F | Ht 61.0 in | Wt 208.6 lb

## 2023-01-08 DIAGNOSIS — E669 Obesity, unspecified: Secondary | ICD-10-CM | POA: Diagnosis not present

## 2023-01-08 DIAGNOSIS — G8929 Other chronic pain: Secondary | ICD-10-CM | POA: Diagnosis not present

## 2023-01-08 DIAGNOSIS — M545 Low back pain, unspecified: Secondary | ICD-10-CM | POA: Diagnosis not present

## 2023-01-08 DIAGNOSIS — I10 Essential (primary) hypertension: Secondary | ICD-10-CM

## 2023-01-08 DIAGNOSIS — K429 Umbilical hernia without obstruction or gangrene: Secondary | ICD-10-CM | POA: Insufficient documentation

## 2023-01-08 DIAGNOSIS — Z7985 Long-term (current) use of injectable non-insulin antidiabetic drugs: Secondary | ICD-10-CM | POA: Insufficient documentation

## 2023-01-08 DIAGNOSIS — Z6830 Body mass index (BMI) 30.0-30.9, adult: Secondary | ICD-10-CM

## 2023-01-08 DIAGNOSIS — Q250 Patent ductus arteriosus: Secondary | ICD-10-CM

## 2023-01-08 DIAGNOSIS — E1169 Type 2 diabetes mellitus with other specified complication: Secondary | ICD-10-CM

## 2023-01-08 DIAGNOSIS — Z7984 Long term (current) use of oral hypoglycemic drugs: Secondary | ICD-10-CM

## 2023-01-08 MED ORDER — LABETALOL HCL 300 MG PO TABS: 300.0000 mg | ORAL_TABLET | Freq: Two times a day (BID) | ORAL | 1 refills | Status: DC

## 2023-01-08 NOTE — Patient Instructions (Signed)
It was great to see you!  I have referred to you general surgery for your hernia. If you don't hear from them in the next week call [phone]  Let me know if you need any refills on your medications.   Let's follow-up in 6 months, sooner if you have concerns.  If a referral was placed today, you will be contacted for an appointment. Please note that routine referrals can sometimes take up to 3-4 weeks to process. Please call our office if you haven't heard anything after this time frame.  Take care,  Rodman Pickle, NP

## 2023-01-08 NOTE — Assessment & Plan Note (Signed)
Continue metformin XR 500 mg twice a day

## 2023-01-08 NOTE — Progress Notes (Addendum)
New Patient Visit  BP 122/80 (BP Location: Left Arm)   Pulse 81   Temp (!) 97.1 F (36.2 C)   Ht 5\' 1"  (1.549 m)   Wt 208 lb 9.6 oz (94.6 kg)   LMP 12/11/2022 (Exact Date)   SpO2 98%   BMI 39.41 kg/m    Subjective:    Patient ID: Nicole Stuart, female    DOB: 30-Jan-1991, 32 y.o.   MRN: [id number]  CC: Chief Complaint  Patient presents with   Establish Care    NP. Est Care, concerns with hernia pain for 2 months, discuss her diabetes    HPI: Nicole Stuart is a 32 y.o. female presents for new patient visit to establish care.  Introduced to Publishing rights manager role and practice setting.  All questions answered.  Discussed provider/patient relationship and expectations.  She has a history of type 2 diabetes after her last pregnancy.  She is currently following with endocrinology and will be going once a year.  She is taking metformin XR 500 mg twice a day and Ozempic 0.25 mg injection weekly.  She states that she was having GI symptoms including nausea, vomiting, diarrhea with [place].  She has a continuous glucose monitor to check her blood sugars.  She denies chest pain, shortness of breath, numbness and tingling in her feet.   She has a history of hypertension and is currently taking amlodipine 10 mg daily, labetalol 300 mg twice a day, and spironolactone 25 mg daily.  She has a history of a hernia to her umbilical area.  She noticed this after her cesarean section last year.  She states that she is starting to have some tenderness to the area.  She tends to sleep on her stomach which makes the pain worse.  She denies nausea, vomiting, constipation currently.   She has a history of intermittent chronic back pain.  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  She takes Flexeril 10 mg 3 times daily as needed.  Depression and anxiety screen done:     01/08/2023    2:21 PM  Depression screen PHQ 2/9  Decreased  Interest 0  Down, Depressed, Hopeless 0  PHQ - 2 Score 0  Altered sleeping 0  Tired, decreased energy 1  Change in appetite 0  Feeling bad or failure about yourself  0  Trouble concentrating 0  Moving slowly or fidgety/restless 0  Suicidal thoughts 0  PHQ-9 Score 1  Difficult doing work/chores Not difficult at all      01/08/2023    2:21 PM  GAD 7 : Generalized Anxiety Score  Nervous, Anxious, on Edge 1  Control/stop worrying 0  Worry too much - different things 1  Trouble relaxing 1  Restless 1  Easily annoyed or irritable 1  Afraid - awful might happen 1  Total GAD 7 Score 6  Anxiety Difficulty Somewhat difficult   Past Medical History:  Diagnosis Date   Diabetes mellitus type 2 in obese 11/02/2020   Dysrhythmia    first degree heart block.    Essential hypertension 11/02/2020   Gestational diabetes    Heart murmur    Hypertension    Patent ductus arteriosus    Sleep apnea    study done two weeks ago [place]     Past Surgical History:  Procedure Laterality Date   CESAREAN SECTION     CESAREAN SECTION  N/A 05/08/2021   Procedure: CESAREAN SECTION;  Surgeon: Maxie Better, MD;  Location: [facility];  Service: Obstetrics;  Laterality: N/A;   NASAL SEPTOPLASTY W/ TURBINOPLASTY  09/22/2011   Procedure: NASAL SEPTOPLASTY WITH TURBINATE REDUCTION;  Surgeon: Drema Halon, MD;  Location: [facility] OR;  Service: ENT;  Laterality: Bilateral;   TONSILLECTOMY  09/22/2011   Procedure: TONSILLECTOMY;  Surgeon: Drema Halon, MD;  Location: [facility] OR;  Service: ENT;  Laterality: Bilateral;    Family History  Adopted: Yes  Problem Relation Age of Onset   Diabetes Mother    Obesity Mother    Hypertension Sister    Hypertension Brother      Social History   Tobacco Use   Smoking status: Never   Smokeless tobacco: Never  Vaping Use   Vaping Use: Never used  Substance Use Topics   Alcohol use: No   Drug use: No    Current Outpatient Medications on File Prior  to Visit  Medication Sig Dispense Refill   amLODipine (NORVASC) 10 MG tablet Take 1 tablet (10 mg total) by mouth daily. 90 tablet 1   Blood Glucose Monitoring Suppl (FREESTYLE LITE) w/Device KIT Use 2 times daily as directed 1 kit 0   Continuous Glucose Sensor (FREESTYLE LIBRE 3 SENSOR) MISC by Does not apply route.     cyclobenzaprine (FLEXERIL) 10 MG tablet Take 1 tablet (10 mg total) by mouth 3 (three) times daily as needed for muscle spasms. 30 tablet 1   docusate sodium (COLACE) 100 MG capsule Take 100 mg by mouth 2 (two) times daily as needed for mild constipation.     Glucose Blood (BLOOD GLUCOSE TEST STRIPS) STRP Check blood sugar twice daily 100 strip 5   ibuprofen (ADVIL) 800 MG tablet Take 1 tablet every 8 hours as needed for pain 20 tablet 1   Lancets (FREESTYLE) lancets Use 2 times daily as directed 100 each 5   metFORMIN (GLUCOPHAGE-XR) 500 MG 24 hr tablet Take 1 tablet (500 mg total) by mouth 2 (two) times daily. 60 tablet 0   naproxen (NAPROSYN) 500 MG tablet Take 1 tablet (500 mg total) by mouth 2 (two) times daily with a meal. 60 tablet 1   Prenatal Vit-Fe Fumarate-FA (PRENATAL VITAMIN PO) Take 1 tablet by mouth in the morning.     Semaglutide,0.25 or 0.5MG /DOS, (OZEMPIC, 0.25 OR 0.5 MG/DOSE,) 2 MG/3ML SOPN Inject 0.25 mg into the skin once a week for 4 weeks. if no side effects increase to 0.5 mg into the skin weekly 3 mL 3   spironolactone (ALDACTONE) 25 MG tablet Take 1 tablet (25 mg total) by mouth daily. 90 tablet 3   No current facility-administered medications on file prior to visit.     Review of Systems  Constitutional:  Positive for fatigue. Negative for fever.  HENT: Negative.    Eyes: Negative.   Respiratory: Negative.    Cardiovascular: Negative.   Gastrointestinal:  Positive for abdominal pain. Negative for constipation and diarrhea.       Umbilical hernia  Genitourinary: Negative.   Musculoskeletal: Negative.   Skin: Negative.   Neurological: Negative.    Psychiatric/Behavioral: Negative.        Objective:    BP 122/80 (BP Location: Left Arm)   Pulse 81   Temp (!) 97.1 F (36.2 C)   Ht 5\' 1"  (1.549 m)   Wt 208 lb 9.6 oz (94.6 kg)   LMP 12/11/2022 (Exact Date)   SpO2 98%   BMI  39.41 kg/m   Wt Readings from Last 3 Encounters:  01/08/23 208 lb 9.6 oz (94.6 kg)  08/07/22 202 lb (91.6 kg)  06/26/22 195 lb (88.5 kg)    BP Readings from Last 3 Encounters:  01/08/23 122/80  08/07/22 (!) 170/84  06/26/22 116/75    Physical Exam Vitals and nursing note reviewed.  Constitutional:      General: She is not in acute distress.    Appearance: Normal appearance. She is obese.  HENT:     Head: Normocephalic and atraumatic.     Right Ear: Tympanic membrane, ear canal and external ear normal.     Left Ear: Tympanic membrane, ear canal and external ear normal.  Eyes:     Conjunctiva/sclera: Conjunctivae normal.  Cardiovascular:     Rate and Rhythm: Normal rate and regular rhythm.     Pulses: Normal pulses.     Heart sounds: Murmur heard.  Pulmonary:     Effort: Pulmonary effort is normal.     Breath sounds: Normal breath sounds.  Abdominal:     Palpations: Abdomen is soft.     Tenderness: There is no abdominal tenderness.     Hernia: A hernia (umbilical) is present.  Musculoskeletal:        General: Normal range of motion.     Cervical back: Normal range of motion and neck supple.     Right lower leg: No edema.     Left lower leg: No edema.  Lymphadenopathy:     Cervical: No cervical adenopathy.  Skin:    General: Skin is warm and dry.  Neurological:     General: No focal deficit present.     Mental Status: She is alert and oriented to person, place, and time.     Cranial Nerves: No cranial nerve deficit.     Coordination: Coordination normal.     Gait: Gait normal.  Psychiatric:        Mood and Affect: Mood normal.        Behavior: Behavior normal.        Thought Content: Thought content normal.        Judgment:  Judgment normal.        Assessment & Plan:   Problem List Items Addressed This Visit       Cardiovascular and Mediastinum   Patent ductus arteriosus    She is currently following with cardiology.  Continue collaboration recommendations from specialist.      Relevant Medications   labetalol (NORMODYNE) 300 MG tablet   Essential hypertension    Chronic, stable.  Continue amlodipine 10 mg daily, labetalol 300 mg twice daily, and spironolactone 25 mg daily.  Recent labs reviewed.  Follow-up in 6 months.      Relevant Medications   labetalol (NORMODYNE) 300 MG tablet     Endocrine   Type 2 diabetes mellitus with obesity (HCC)    Chronic, stable.  Last A1c was 6.4%.  Continue metformin XR 500 mg twice a day and Ozempic 0.25 mg injection weekly.  Continue wearing continuous glucose monitor and checking sugars at home.  Foot exam normal today.  She states that she has had an eye exam, will request records.  Follow-up in 6 months.        Other   Obesity (BMI 30-39.9)    BMI 39.4.  Discussed nutrition and exercise.      Umbilical hernia without obstruction and without gangrene - Primary    She has been having the umbilical  hernia since having her cesarean section I had a year ago.  She also has diastasis recti which could be playing a role in this.  Will place referral to general surgery.      Relevant Orders   Ambulatory referral to General Surgery   Chronic midline low back pain without sciatica    Chronic, stable.  Continue Flexeril 10 mg 3 times daily as needed and naproxen 500 mg twice a day as needed.  Follow-up as symptoms worsen or any concerns.      Long term current use of oral hypoglycemic drug    Continue metformin XR 500 mg twice a day      Long-term current use of injectable noninsulin antidiabetic medication    Continue Ozempic 0.25 mg injection weekly        Follow up plan: Return in about 6 months (around 07/11/2023) for Diabetes, HTN.

## 2023-01-08 NOTE — Assessment & Plan Note (Signed)
She has been having the umbilical hernia since having her cesarean section I had a year ago.  She also has diastasis recti which could be playing a role in this.  Will place referral to general surgery.

## 2023-01-08 NOTE — Assessment & Plan Note (Signed)
She is currently following with cardiology.  Continue collaboration recommendations from specialist.

## 2023-01-08 NOTE — Assessment & Plan Note (Signed)
Chronic, stable.  Last A1c was 6.4%.  Continue metformin XR 500 mg twice a day and Ozempic 0.25 mg injection weekly.  Continue wearing continuous glucose monitor and checking sugars at home.  Foot exam normal today.  She states that she has had an eye exam, will request records.  Follow-up in 6 months.

## 2023-01-08 NOTE — Assessment & Plan Note (Signed)
Continue Ozempic 0.25 mg injection weekly

## 2023-01-08 NOTE — Assessment & Plan Note (Signed)
BMI 39.4. Discussed nutrition and exercise.  

## 2023-01-08 NOTE — Assessment & Plan Note (Signed)
Chronic, stable.  Continue amlodipine 10 mg daily, labetalol 300 mg twice daily, and spironolactone 25 mg daily.  Recent labs reviewed.  Follow-up in 6 months.

## 2023-01-08 NOTE — Assessment & Plan Note (Signed)
Chronic, stable.  Continue Flexeril 10 mg 3 times daily as needed and naproxen 500 mg twice a day as needed.  Follow-up as symptoms worsen or any concerns.

## 2023-02-06 ENCOUNTER — Ambulatory Visit: Payer: Self-pay | Admitting: Surgery

## 2023-02-06 DIAGNOSIS — K429 Umbilical hernia without obstruction or gangrene: Secondary | ICD-10-CM | POA: Diagnosis not present

## 2023-02-06 DIAGNOSIS — M6208 Separation of muscle (nontraumatic), other site: Secondary | ICD-10-CM | POA: Diagnosis not present

## 2023-02-06 NOTE — H&P (Signed)
Subjective    Chief Complaint: Umbilical Hernia       History of Present Illness: Nicole Stuart is a 32 y.o. female who is seen today as an office consultation at the request of NP McElwee for evaluation of Umbilical Hernia .     This is a 32 year old female with type 2 diabetes on metformin and Ozempic who presents with an umbilical hernia.  This has been present for several years.  The patient developed a fairly large rectus diastases due to pregnancy is complicated by gestational diabetes.  She has very poor muscle tone in her abdominal wall.  Due to the increasing discomfort and the enlarging size, she would like to have a discussion about hernia repair.  The patient has been experiencing a lot of abdominal bloating and constipation.  She has been on Mounjaro and Ozempic.  She is on regular MiraLAX and stool softeners.     Review of Systems: A complete review of systems was obtained from the patient.  I have reviewed this information and discussed as appropriate with the patient.  See HPI as well for other ROS.   Review of Systems  Constitutional: Negative.   HENT: Negative.    Eyes: Negative.   Respiratory: Negative.    Cardiovascular: Negative.   Gastrointestinal:  Positive for abdominal pain and constipation.  Genitourinary: Negative.   Musculoskeletal: Negative.   Skin: Negative.   Neurological: Negative.   Endo/Heme/Allergies: Negative.   Psychiatric/Behavioral: Negative.          Medical History: Past Medical History      Past Medical History:  Diagnosis Date   Diabetes mellitus without complication (CMS/HHS-HCC)     Hypertension     Sickle cell trait (CMS-HCC)          Problem List     Patient Active Problem List  Diagnosis   Umbilical hernia without obstruction or gangrene        Past Surgical History       Past Surgical History:  Procedure Laterality Date   nasal surgery   08/19/2011   CESAREAN SECTION   08/04/2019   TONSILLECTOMY             Allergies  No Known Allergies     Medications Ordered Prior to Encounter        Current Outpatient Medications on File Prior to Visit  Medication Sig Dispense Refill   amLODIPine (NORVASC) 10 MG tablet Take 1 tablet by mouth once daily       labetaloL (TRANDATE) 300 MG tablet Take 300 mg by mouth 2 (two) times daily       metFORMIN (GLUCOPHAGE) 1000 MG tablet Take by mouth       semaglutide (OZEMPIC) 0.25 mg or 0.5 mg(2 mg/1.5 mL) pen injector Inject 0.25 mg subcutaneously every 7 (seven) days       spironolactone (ALDACTONE) 25 MG tablet Take 25 mg by mouth once daily       aspirin 81 MG EC tablet Take by mouth (Patient not taking: Reported on 02/06/2023)       flash glucose sensor (FREESTYLE LIBRE 14 DAY SENSOR) kit Inject 1 sensor to the skin every 14 days for continuous glucose monitoring. (Patient not taking: Reported on 02/06/2023)       hydrOXYzine (VISTARIL) 50 MG capsule TAKE 1 CAPSULE BY MOUTH DAILY AS NEEDED FOR ANXIETY (Patient not taking: Reported on 02/06/2023)       labetaloL (TRANDATE) 100 MG tablet TAKE 1 TABLET BY MOUTH 2  TIMES DAILY (STOP NIFEDIPINE) (Patient not taking: Reported on 02/06/2023)       metFORMIN (GLUCOPHAGE) 500 MG tablet TAKE 2 TABLETS BY MOUTH TWO TIMES DAILY (Patient not taking: Reported on 02/06/2023)       prenatal vit-iron fum-folic ac (PRENAVITE) tablet Take by mouth (Patient not taking: Reported on 02/06/2023)        No current facility-administered medications on file prior to visit.        Family History  History reviewed. No pertinent family history.      Tobacco Use History  Social History       Tobacco Use  Smoking Status Never  Smokeless Tobacco Never        Social History  Social History        Socioeconomic History   Marital status: Single  Tobacco Use   Smoking status: Never   Smokeless tobacco: Never  Substance and Sexual Activity   Alcohol use: Never   Drug use: Never    Social Determinants of Health        Food  Insecurity: No Food Insecurity (11/02/2020)    Received from [facility]    Hunger Vital Sign     Worried About Running Out of Food in the Last Year: Never true     Ran Out of Food in the Last Year: Never true  Transportation Needs: No Transportation Needs (11/02/2020)    Received from [facility] - Transportation     Lack of Transportation (Medical): No     Lack of Transportation (Non-Medical): No  Physical Activity: Insufficiently Active (11/02/2020)    Received from [facility]    Exercise Vital Sign     Days of Exercise per Week: 2 days     Minutes of Exercise per Session: 60 min  Stress: No Stress Concern Present (11/02/2020)    Received from [facility] - Occupational Stress Questionnaire     Feeling of Stress : Not at all        Objective:    Physical Exam    Constitutional:  WDWN in NAD, conversant, no obvious deformities; lying in bed comfortably Eyes:  Pupils equal, round; sclera anicteric; moist conjunctiva; no lid lag HENT:  Oral mucosa moist; good dentition  Neck:  No masses palpated, trachea midline; no thyromegaly Lungs:  CTA bilaterally; normal respiratory effort CV:  Regular rate and rhythm; no murmurs; extremities well-perfused with no edema Abd:  +bowel sounds, soft, obese, very poor abdominal muscle tone.  The patient has a large upper midline rectus diastases.  She has a visible palpable umbilical hernia.  The hernia sac is approximately 5 cm in diameter when she is standing with Valsalva maneuver.  The hernia is mostly reducible.  The fascial defect is about 3 cm. Musc: Normal gait; no apparent clubbing or cyanosis in extremities Lymphatic:  No palpable cervical or axillary lymphadenopathy Skin:  Warm, dry; no sign of jaundice Psychiatric - alert and oriented x 4; calm mood and affect     Assessment and Plan:  Diagnoses and all orders for this visit:   Umbilical hernia without obstruction or gangrene    Rectus diastasis       Recommend umbilical hernia repair with mesh.The surgical procedure has been discussed with the patient.  Potential risks, benefits, alternative treatments, and expected outcomes have been explained.  All of the patient's questions at this time have been answered.  The likelihood of  reaching the patient's treatment goal is good.  The patient understand the proposed surgical procedure and wishes to proceed.   Hold aspirin and Ozempic prior to surgery   Kinzy Weyers Delbert Harness, MD  02/06/2023 12:15 PM

## 2023-03-01 ENCOUNTER — Other Ambulatory Visit: Payer: Self-pay | Admitting: Nurse Practitioner

## 2023-03-01 ENCOUNTER — Other Ambulatory Visit (HOSPITAL_COMMUNITY): Payer: Self-pay

## 2023-03-02 ENCOUNTER — Other Ambulatory Visit: Payer: Self-pay

## 2023-03-02 ENCOUNTER — Other Ambulatory Visit (HOSPITAL_COMMUNITY): Payer: Self-pay

## 2023-03-03 ENCOUNTER — Other Ambulatory Visit (HOSPITAL_COMMUNITY): Payer: Self-pay

## 2023-03-03 ENCOUNTER — Other Ambulatory Visit: Payer: Self-pay

## 2023-03-03 DIAGNOSIS — K429 Umbilical hernia without obstruction or gangrene: Secondary | ICD-10-CM | POA: Diagnosis not present

## 2023-03-03 MED ORDER — METFORMIN HCL ER 500 MG PO TB24
500.0000 mg | ORAL_TABLET | Freq: Two times a day (BID) | ORAL | 3 refills | Status: DC
  Filled 2023-03-03: qty 60, 30d supply, fill #0
  Filled 2023-12-29: qty 60, 30d supply, fill #1

## 2023-03-04 ENCOUNTER — Other Ambulatory Visit (HOSPITAL_COMMUNITY): Payer: Self-pay

## 2023-03-04 MED ORDER — DOCUSATE SODIUM 100 MG PO CAPS
100.0000 mg | ORAL_CAPSULE | Freq: Two times a day (BID) | ORAL | 0 refills | Status: AC | PRN
  Filled 2023-03-04: qty 30, 15d supply, fill #0

## 2023-03-04 MED ORDER — FREESTYLE LIBRE 3 SENSOR MISC
1.0000 | 1 refills | Status: DC
  Filled 2023-03-04: qty 6, 84d supply, fill #0

## 2023-03-04 NOTE — Telephone Encounter (Signed)
LVM that Rx refills sent to pharmacy

## 2023-03-04 NOTE — Telephone Encounter (Signed)
Requesting: docusate sodium (COLACE) 100 MG capsule and docusate sodium (COLACE) 100 MG capsule  Last Visit: 01/08/2023 Next Visit: Visit date not found Last Refill: 10/15/2022 and 12/24/2022  Please Advise

## 2023-03-12 ENCOUNTER — Other Ambulatory Visit (HOSPITAL_COMMUNITY): Payer: Self-pay

## 2023-03-27 ENCOUNTER — Other Ambulatory Visit: Payer: Self-pay

## 2023-03-27 DIAGNOSIS — K429 Umbilical hernia without obstruction or gangrene: Secondary | ICD-10-CM | POA: Diagnosis not present

## 2023-04-14 ENCOUNTER — Other Ambulatory Visit (HOSPITAL_COMMUNITY): Payer: Self-pay

## 2023-06-04 ENCOUNTER — Other Ambulatory Visit: Payer: Self-pay

## 2023-06-04 ENCOUNTER — Other Ambulatory Visit (HOSPITAL_COMMUNITY): Payer: Self-pay

## 2023-06-04 ENCOUNTER — Encounter: Payer: Self-pay | Admitting: Nurse Practitioner

## 2023-06-04 ENCOUNTER — Telehealth: Payer: BC Managed Care – PPO | Admitting: Nurse Practitioner

## 2023-06-04 DIAGNOSIS — J40 Bronchitis, not specified as acute or chronic: Secondary | ICD-10-CM | POA: Diagnosis not present

## 2023-06-04 MED ORDER — ALBUTEROL SULFATE HFA 108 (90 BASE) MCG/ACT IN AERS
2.0000 | INHALATION_SPRAY | Freq: Four times a day (QID) | RESPIRATORY_TRACT | 0 refills | Status: AC | PRN
  Filled 2023-06-04 (×2): qty 6.7, 25d supply, fill #0

## 2023-06-04 MED ORDER — PREDNISONE 20 MG PO TABS
40.0000 mg | ORAL_TABLET | Freq: Every day | ORAL | 0 refills | Status: DC
  Filled 2023-06-04 (×2): qty 10, 5d supply, fill #0

## 2023-06-04 MED ORDER — BENZONATATE 100 MG PO CAPS
100.0000 mg | ORAL_CAPSULE | Freq: Three times a day (TID) | ORAL | 0 refills | Status: DC | PRN
  Filled 2023-06-04 (×2): qty 30, 10d supply, fill #0

## 2023-06-04 NOTE — Patient Instructions (Signed)
It was great to see you!  Start prednisone 2 tablets daily in the morning with food  I have sent in tessalon to take as needed for cough 3 times a day and an albuterol inhaler as needed for shortness of breath or wheezing 4 times a day.   Keep drinking plenty of fluids.   Let's follow-up if symptoms worsen or any concerns.   Take care,  Rodman Pickle, NP

## 2023-06-04 NOTE — Progress Notes (Signed)
[facility] [address] [place] [address] Dept: [phone] Dept Fax: [phone]  Virtual Video Visit  I connected with Raliegh Ip Blahnik on 06/04/23 at 10:20 AM EDT by a video enabled telemedicine application and verified that I am speaking with the correct person using two identifiers.  Location patient: Home Location provider: Clinic Persons participating in the virtual visit: Patient; Rodman Pickle, NP; Malena Peer, CMA  I discussed the limitations of evaluation and management by telemedicine and the availability of in person appointments. The patient expressed understanding and agreed to proceed.  Chief Complaint  Patient presents with   Acute Visit    PT is having a mild cough that comes and go for 1 month. No fever, body aches or loss of appetite is present. PT have been taking OTC medication but voiced it isn't helping. PT was tested for Covid and flu and results were negative. PT stated she may be bronchitis.     SUBJECTIVE:  HPI: Jackelin Correia Kozlowski is a 32 y.o. female who presents with ongoing cough for 3 weeks.  She states that she was not sick with a URI originally.  She denies nasal congestion, ear pain, fever, post nasal drip, and sore throat.  She does have some coughing spells and will sometimes get short of breath.  She also has noticed some wheezing.  She has been getting tested weekly for COVID and flu from her job.  They have been negative every time.  She has been taking TheraFlu and Robitussin which helps for about an hour and then symptoms worsen again.  She states that she has had bronchitis before and feels like this is what she had last time.  She is requesting some Tessalon Perles.  Patient Active Problem List   Diagnosis Date Noted   Umbilical hernia without obstruction and without gangrene 01/08/2023   Chronic midline low back pain without sciatica 01/08/2023   Long term current use of oral  hypoglycemic drug 01/08/2023   Long-term current use of injectable noninsulin antidiabetic medication 01/08/2023   Type 2 diabetes mellitus with obesity (HCC) 11/02/2020   Essential hypertension 11/02/2020   Patent ductus arteriosus 03/07/2019   Heart murmur 10/11/2014   Obesity (BMI 30-39.9) 10/11/2014    Past Surgical History:  Procedure Laterality Date   CESAREAN SECTION     CESAREAN SECTION N/A 05/08/2021   Procedure: CESAREAN SECTION;  Surgeon: Maxie Better, MD;  Location: [facility] ORS;  Service: Obstetrics;  Laterality: N/A;   NASAL SEPTOPLASTY W/ TURBINOPLASTY  09/22/2011   Procedure: NASAL SEPTOPLASTY WITH TURBINATE REDUCTION;  Surgeon: Drema Halon, MD;  Location[facility];  Service: ENT;  Laterality: Bilateral;   TONSILLECTOMY  09/22/2011   Procedure: TONSILLECTOMY;  Surgeon: Drema Halon, MD;  Location: [facility] OR;  Service: ENT;  Laterality: Bilateral;    Family History  Adopted: Yes  Problem Relation Age of Onset   Diabetes Mother    Obesity Mother    Hypertension Sister    Hypertension Brother     Social History   Tobacco Use   Smoking status: Never   Smokeless tobacco: Never  Vaping Use   Vaping status: Never Used  Substance Use Topics   Alcohol use: No   Drug use: No     Current Outpatient Medications:    albuterol (VENTOLIN HFA) 108 (90 Base) MCG/ACT inhaler, Inhale 2 puffs into the lungs every 6 (six) hours as needed for wheezing or shortness of breath.,  Disp: 8 g, Rfl: 0   benzonatate (TESSALON) 100 MG capsule, Take 1 capsule (100 mg total) by mouth 3 (three) times daily as needed for cough., Disp: 30 capsule, Rfl: 0   Blood Glucose Monitoring Suppl (FREESTYLE LITE) w/Device KIT, Use 2 times daily as directed, Disp: 1 kit, Rfl: 0   Continuous Glucose Sensor (FREESTYLE LIBRE 3 SENSOR) MISC, Use 1 each every 14 (fourteen) days., Disp: 6 each, Rfl: 1   cyclobenzaprine (FLEXERIL) 10 MG tablet, Take 1 tablet (10 mg total) by mouth 3 (three) times  daily as needed for muscle spasms., Disp: 30 tablet, Rfl: 1   docusate sodium (COLACE) 100 MG capsule, Take 1 capsule (100 mg total) by mouth 2 (two) times daily as needed for mild constipation., Disp: 30 capsule, Rfl: 0   Glucose Blood (BLOOD GLUCOSE TEST STRIPS) STRP, Check blood sugar twice daily, Disp: 100 strip, Rfl: 5   ibuprofen (ADVIL) 800 MG tablet, Take 1 tablet every 8 hours as needed for pain, Disp: 20 tablet, Rfl: 1   labetalol (NORMODYNE) 300 MG tablet, Take 1 tablet (300 mg total) by mouth 2 (two) times daily., Disp: 90 tablet, Rfl: 1   Lancets (FREESTYLE) lancets, Use 2 times daily as directed, Disp: 100 each, Rfl: 5   metFORMIN (GLUCOPHAGE-XR) 500 MG 24 hr tablet, Take 1 tablet (500 mg total) by mouth 2 (two) times daily., Disp: 60 tablet, Rfl: 3   predniSONE (DELTASONE) 20 MG tablet, Take 2 tablets (40 mg total) by mouth daily with breakfast., Disp: 10 tablet, Rfl: 0   Semaglutide,0.25 or 0.5MG /DOS, (OZEMPIC, 0.25 OR 0.5 MG/DOSE,) 2 MG/3ML SOPN, Inject 0.25 mg into the skin once a week for 4 weeks. if no side effects increase to 0.5 mg into the skin weekly, Disp: 3 mL, Rfl: 3   spironolactone (ALDACTONE) 25 MG tablet, Take 1 tablet (25 mg total) by mouth daily., Disp: 90 tablet, Rfl: 3   amLODipine (NORVASC) 10 MG tablet, Take 1 tablet (10 mg total) by mouth daily., Disp: 90 tablet, Rfl: 1   naproxen (NAPROSYN) 500 MG tablet, Take 1 tablet (500 mg total) by mouth 2 (two) times daily with a meal. (Patient not taking: Reported on 06/04/2023), Disp: 60 tablet, Rfl: 1   Prenatal Vit-Fe Fumarate-FA (PRENATAL VITAMIN PO), Take 1 tablet by mouth in the morning. (Patient not taking: Reported on 06/04/2023), Disp: , Rfl:   Allergies  Allergen Reactions   Chlorhexidine Rash    ROS: See pertinent positives and negatives per HPI.  OBSERVATIONS/OBJECTIVE:  VITALS per patient if applicable: There were no vitals filed for this visit. There is no height or weight on file to calculate  BMI.    GENERAL: Alert and oriented. Appears well and in no acute distress.  HEENT: Atraumatic. Conjunctiva clear. No obvious abnormalities on inspection of external nose and ears.  NECK: Normal movements of the head and neck.  LUNGS: On inspection, no signs of respiratory distress. Breathing rate appears normal. No obvious gross SOB, gasping or wheezing, and no conversational dyspnea.  CV: No obvious cyanosis.  MS: Moves all visible extremities without noticeable abnormality.  PSYCH/NEURO: Pleasant and cooperative. No obvious depression or anxiety. Speech and thought processing grossly intact.  ASSESSMENT AND PLAN:  Problem List Items Addressed This Visit   None Visit Diagnoses     Bronchitis with wheezing    -  Primary   Will treat with prednisone 40mg  x5 days, tessalon perles prn cough, and albuterol prn shortness of breath or wheezing. Follow-up  if not improving.        I discussed the assessment and treatment plan with the patient. The patient was provided an opportunity to ask questions and all were answered. The patient agreed with the plan and demonstrated an understanding of the instructions.   The patient was advised to call back or seek an in-person evaluation if the symptoms worsen or if the condition fails to improve as anticipated.   Gerre Scull, NP

## 2023-07-06 ENCOUNTER — Encounter: Payer: Self-pay | Admitting: Nurse Practitioner

## 2023-07-06 ENCOUNTER — Ambulatory Visit: Payer: BC Managed Care – PPO | Admitting: Nurse Practitioner

## 2023-07-06 VITALS — BP 130/70 | HR 89 | Temp 97.3°F | Ht 61.0 in | Wt 211.6 lb

## 2023-07-06 DIAGNOSIS — Z1159 Encounter for screening for other viral diseases: Secondary | ICD-10-CM | POA: Diagnosis not present

## 2023-07-06 DIAGNOSIS — Z7985 Long-term (current) use of injectable non-insulin antidiabetic drugs: Secondary | ICD-10-CM

## 2023-07-06 DIAGNOSIS — E1169 Type 2 diabetes mellitus with other specified complication: Secondary | ICD-10-CM | POA: Diagnosis not present

## 2023-07-06 DIAGNOSIS — R053 Chronic cough: Secondary | ICD-10-CM

## 2023-07-06 DIAGNOSIS — Z7984 Long term (current) use of oral hypoglycemic drugs: Secondary | ICD-10-CM | POA: Diagnosis not present

## 2023-07-06 DIAGNOSIS — I10 Essential (primary) hypertension: Secondary | ICD-10-CM | POA: Diagnosis not present

## 2023-07-06 DIAGNOSIS — E785 Hyperlipidemia, unspecified: Secondary | ICD-10-CM

## 2023-07-06 DIAGNOSIS — E669 Obesity, unspecified: Secondary | ICD-10-CM

## 2023-07-06 LAB — LIPID PANEL
Cholesterol: 169 mg/dL (ref 0–200)
HDL: 32.2 mg/dL — ABNORMAL LOW (ref >39.00)
LDL Cholesterol: 115 mg/dL — ABNORMAL HIGH (ref 0–99)
NonHDL: 137.29
Total CHOL/HDL Ratio: 5
Triglycerides: 111 mg/dL (ref 0.0–149.0)
VLDL: 22.2 mg/dL (ref 0.0–40.0)

## 2023-07-06 LAB — CBC WITH DIFFERENTIAL/PLATELET
Basophils Absolute: 0 10*3/uL (ref 0.0–0.1)
Basophils Relative: 0.4 % (ref 0.0–3.0)
Eosinophils Absolute: 0.3 10*3/uL (ref 0.0–0.7)
Eosinophils Relative: 2.7 % (ref 0.0–5.0)
HCT: 34.3 % — ABNORMAL LOW (ref 36.0–46.0)
Hemoglobin: 11.2 g/dL — ABNORMAL LOW (ref 12.0–15.0)
Lymphocytes Relative: 38 % (ref 12.0–46.0)
Lymphs Abs: 4.6 10*3/uL — ABNORMAL HIGH (ref 0.7–4.0)
MCHC: 32.6 g/dL (ref 30.0–36.0)
MCV: 74 fL — ABNORMAL LOW (ref 78.0–100.0)
Monocytes Absolute: 0.6 10*3/uL (ref 0.1–1.0)
Monocytes Relative: 5.1 % (ref 3.0–12.0)
Neutro Abs: 6.5 10*3/uL (ref 1.4–7.7)
Neutrophils Relative %: 53.8 % (ref 43.0–77.0)
Platelets: 427 10*3/uL — ABNORMAL HIGH (ref 150.0–400.0)
RBC: 4.64 Mil/uL (ref 3.87–5.11)
RDW: 17.9 % — ABNORMAL HIGH (ref 11.5–15.5)
WBC: 12 10*3/uL — ABNORMAL HIGH (ref 4.0–10.5)

## 2023-07-06 LAB — COMPREHENSIVE METABOLIC PANEL
ALT: 14 U/L (ref 0–35)
AST: 13 U/L (ref 0–37)
Albumin: 4 g/dL (ref 3.5–5.2)
Alkaline Phosphatase: 119 U/L — ABNORMAL HIGH (ref 39–117)
BUN: 9 mg/dL (ref 6–23)
CO2: 26 meq/L (ref 19–32)
Calcium: 9.1 mg/dL (ref 8.4–10.5)
Chloride: 103 meq/L (ref 96–112)
Creatinine, Ser: 0.57 mg/dL (ref 0.40–1.20)
GFR: 118.6 mL/min (ref >60.00)
Glucose, Bld: 157 mg/dL — ABNORMAL HIGH (ref 70–99)
Potassium: 3.7 meq/L (ref 3.5–5.1)
Sodium: 138 meq/L (ref 135–145)
Total Bilirubin: 0.4 mg/dL (ref 0.2–1.2)
Total Protein: 7 g/dL (ref 6.0–8.3)

## 2023-07-06 LAB — MICROALBUMIN / CREATININE URINE RATIO
Creatinine,U: 96.4 mg/dL
Microalb Creat Ratio: 1.8 mg/g (ref 0.0–30.0)
Microalb, Ur: 1.8 mg/dL (ref 0.0–1.9)

## 2023-07-06 LAB — HEMOGLOBIN A1C: Hgb A1c MFr Bld: 7 % — ABNORMAL HIGH (ref 4.6–6.5)

## 2023-07-06 MED ORDER — EMPAGLIFLOZIN 10 MG PO TABS: 10.0000 mg | ORAL_TABLET | Freq: Every day | ORAL | 1 refills | Status: DC

## 2023-07-06 MED ORDER — QVAR REDIHALER 80 MCG/ACT IN AERB: 2.0000 | INHALATION_SPRAY | Freq: Two times a day (BID) | RESPIRATORY_TRACT | 1 refills | Status: DC

## 2023-07-06 MED ORDER — LOSARTAN POTASSIUM-HCTZ 50-12.5 MG PO TABS: 1.0000 | ORAL_TABLET | Freq: Every day | ORAL | 1 refills | Status: DC

## 2023-07-06 MED ORDER — OZEMPIC (0.25 OR 0.5 MG/DOSE) 2 MG/3ML ~~LOC~~ SOPN: 0.2500 mg | PEN_INJECTOR | SUBCUTANEOUS | 3 refills | Status: DC

## 2023-07-06 NOTE — Progress Notes (Signed)
Established Patient Office Visit  Subjective   Patient ID: Nicole Stuart, female    DOB: [birth year]  Age: 32 y.o. MRN: [id number]  Chief Complaint  Patient presents with   Diabetes    Fasting blood sugar elevated, elevated blood pressure, coughing for 3 months    HPI  Discussed the use of AI scribe software for clinical note transcription with the patient, who gave verbal consent to proceed.  History of Present Illness   The patient, with a history of hernia surgery, presents with a chronic cough that has persisted for three months. The cough started after the surgery and has been associated with wheezing, particularly when the patient is physically active. The patient reports that the cough has been somewhat responsive to prednisone and albuterol, but it has not completely resolved. She denies shortness of breath, nasal congestion, fever, and ear pain.   In addition to the cough, the patient has been experiencing high blood sugar levels, with fasting levels in the 180s. She is currently on metformin XR 500mg  BID and Ozempic 0.25mg  injection weekly for diabetes management, but she expresses concern that the Ozempic may need to be increased or replaced with a different medication due to persistent high sugar levels. Her surgeon for her hernia, wanted to stop the ozempic, as she was having abdominal pain.   The patient also reports high blood pressure, typically around 140/90, but sometimes reaching 160/100. She is currently on spironolactone, amlodipine, and Lopressor for hypertension management, but she expresses a desire to review and possibly change this medication regimen. She denies chest pain and shortness of breath.        ROS See pertinent positives and negatives per HPI.    Objective:     BP 130/70 (BP Location: Left Arm, Cuff Size: Large)   Pulse 89   Temp (!) 97.3 F (36.3 C)   Ht 5\' 1"  (1.549 m)   Wt 211 lb 9.6 oz (96 kg)   LMP 06/22/2023 (Exact Date)   SpO2 99%    BMI 39.98 kg/m  BP Readings from Last 3 Encounters:  07/06/23 130/70  01/08/23 122/80  08/07/22 (!) 170/84   Wt Readings from Last 3 Encounters:  07/06/23 211 lb 9.6 oz (96 kg)  01/08/23 208 lb 9.6 oz (94.6 kg)  08/07/22 202 lb (91.6 kg)      Physical Exam Vitals and nursing note reviewed.  Constitutional:      General: She is not in acute distress.    Appearance: Normal appearance.  HENT:     Head: Normocephalic.     Right Ear: Tympanic membrane, ear canal and external ear normal.     Left Ear: Tympanic membrane, ear canal and external ear normal.  Eyes:     Conjunctiva/sclera: Conjunctivae normal.  Cardiovascular:     Rate and Rhythm: Normal rate and regular rhythm.     Pulses: Normal pulses.     Heart sounds: Murmur heard.  Pulmonary:     Effort: Pulmonary effort is normal.     Breath sounds: Wheezing present.  Musculoskeletal:     Cervical back: Normal range of motion and neck supple. No tenderness.  Lymphadenopathy:     Cervical: No cervical adenopathy.  Skin:    General: Skin is warm.  Neurological:     General: No focal deficit present.     Mental Status: She is alert and oriented to person, place, and time.  Psychiatric:        Mood and Affect:  Mood normal.        Behavior: Behavior normal.        Thought Content: Thought content normal.        Judgment: Judgment normal.      Assessment & Plan:   Problem List Items Addressed This Visit       Cardiovascular and Mediastinum   Essential hypertension - Primary    Chronic, ongoing. Her blood pressure readings at home range from 140/90 to 160/100 while on spironolactone, amlodipine, and Lopressor. We will discontinue Lopressor, start losartan-hctz 50-12.5 mg daily in the morning, continue amlodipine 10mg  daily to be taken at night, and continue spironolactone 25 mg daily. Educated her to stop the losartan and ozempic were she to become pregnant. She is not currently trying and is going to discuss tubal  ligation with GYN.  Follow-up in 4 weeks.       Relevant Medications   losartan-hydrochlorothiazide (HYZAAR) 50-12.5 MG tablet   Other Relevant Orders   CBC with Differential/Platelet   Comprehensive metabolic panel     Endocrine   Type 2 diabetes mellitus with obesity (HCC)    Chronic, not controlled. Her fasting blood sugars remain in the 180s despite being on metformin and Ozempic, the latter of which was recently restarted at a lower dose post-surgery. We will start Jardiance, 10mg  daily, continue metformin XR 500mg  BID and Ozempic 0.25mg  injection weekly. Check CMP, CBC, A1c today.       Relevant Medications   losartan-hydrochlorothiazide (HYZAAR) 50-12.5 MG tablet   Semaglutide,0.25 or 0.5MG /DOS, (OZEMPIC, 0.25 OR 0.5 MG/DOSE,) 2 MG/3ML SOPN   empagliflozin (JARDIANCE) 10 MG TABS tablet   Other Relevant Orders   CBC with Differential/Platelet   Comprehensive metabolic panel   Hemoglobin A1c   Microalbumin / creatinine urine ratio   Hyperlipidemia associated with type 2 diabetes mellitus (HCC)    Chronic, stable. Check CMP, CBC, lipid panel today and treat based on results.       Relevant Medications   losartan-hydrochlorothiazide (HYZAAR) 50-12.5 MG tablet   Semaglutide,0.25 or 0.5MG /DOS, (OZEMPIC, 0.25 OR 0.5 MG/DOSE,) 2 MG/3ML SOPN   empagliflozin (JARDIANCE) 10 MG TABS tablet   Other Relevant Orders   CBC with Differential/Platelet   Lipid panel   Comprehensive metabolic panel     Other   Long term current use of oral hypoglycemic drug    Continue metformin XR 500 mg twice a day and start jardiance 10mg  daily      Long-term current use of injectable noninsulin antidiabetic medication    Continue Ozempic 0.25 mg injection weekly      Chronic cough    She has experienced a persistent cough for 3 months following hernia surgery, with wheezing noted on exam. Prednisone and albuterol have provided some relief, and no recent illness has been reported. We will order a  chest X-ray to investigate the cause of the cough and check spirometry to assess for possible asthma. We will start a Qvar inhaler, 2 puffs twice daily, instructing her to rinse her mouth after use, and continue albuterol as needed. Spirometry was normal, will await chest xray results.       Relevant Orders   DG Chest 2 View   Spirometry with graph   Other Visit Diagnoses     Encounter for hepatitis C screening test for low risk patient       Screen hepatitis C today   Relevant Orders   Hepatitis C antibody       Return in about  4 weeks (around 08/03/2023) for 4-6 weeks .    Gerre Scull, NP

## 2023-07-06 NOTE — Assessment & Plan Note (Signed)
Continue metformin XR 500 mg twice a day and start jardiance 10mg  daily

## 2023-07-06 NOTE — Assessment & Plan Note (Signed)
Chronic, ongoing. Her blood pressure readings at home range from 140/90 to 160/100 while on spironolactone, amlodipine, and Lopressor. We will discontinue Lopressor, start losartan-hctz 50-12.5 mg daily in the morning, continue amlodipine 10mg  daily to be taken at night, and continue spironolactone 25 mg daily. Educated her to stop the losartan and ozempic were she to become pregnant. She is not currently trying and is going to discuss tubal ligation with GYN.  Follow-up in 4 weeks.

## 2023-07-06 NOTE — Assessment & Plan Note (Signed)
Continue Ozempic 0.25 mg injection weekly

## 2023-07-06 NOTE — Patient Instructions (Addendum)
It was great to see you!  We are checking your labs today and will let you know the results via mychart/phone.   Stop the labetolol and start losartan-hydrochlorothiazide 1 tablet daily  Start jardiance 1 tablet daily. This is for your sugars.   I have ordered a chest x-ray and will let you know the results.   Start qvar inhaler twice a day, rinse your mouth out after using.   Keep using the albuterol inhaler as needed.   Let's follow-up in 4-6 weeks, sooner if you have concerns.  If a referral was placed today, you will be contacted for an appointment. Please note that routine referrals can sometimes take up to 3-4 weeks to process. Please call our office if you haven't heard anything after this time frame.  Take care,  Rodman Pickle, NP

## 2023-07-06 NOTE — Assessment & Plan Note (Signed)
Chronic, not controlled. Her fasting blood sugars remain in the 180s despite being on metformin and Ozempic, the latter of which was recently restarted at a lower dose post-surgery. We will start Jardiance, 10mg  daily, continue metformin XR 500mg  BID and Ozempic 0.25mg  injection weekly. Check CMP, CBC, A1c today.

## 2023-07-06 NOTE — Assessment & Plan Note (Signed)
She has experienced a persistent cough for 3 months following hernia surgery, with wheezing noted on exam. Prednisone and albuterol have provided some relief, and no recent illness has been reported. We will order a chest X-ray to investigate the cause of the cough and check spirometry to assess for possible asthma. We will start a Qvar inhaler, 2 puffs twice daily, instructing her to rinse her mouth after use, and continue albuterol as needed. Spirometry was normal, will await chest xray results.

## 2023-07-06 NOTE — Assessment & Plan Note (Signed)
Chronic, stable.  Check CMP, CBC, lipid panel today and treat based on results.

## 2023-07-07 ENCOUNTER — Ambulatory Visit (INDEPENDENT_AMBULATORY_CARE_PROVIDER_SITE_OTHER): Payer: BC Managed Care – PPO

## 2023-07-07 ENCOUNTER — Telehealth: Payer: Self-pay

## 2023-07-07 DIAGNOSIS — R062 Wheezing: Secondary | ICD-10-CM | POA: Diagnosis not present

## 2023-07-07 DIAGNOSIS — R053 Chronic cough: Secondary | ICD-10-CM

## 2023-07-07 NOTE — Telephone Encounter (Signed)
PA submitted through cover my meds.   Awaitng response.   Unable to attach documentation on cover my meds. Dm/cma  Nicole Stuart

## 2023-07-08 LAB — HEPATITIS C ANTIBODY: Hepatitis C Ab: NONREACTIVE

## 2023-07-09 ENCOUNTER — Encounter: Payer: Self-pay | Admitting: Nurse Practitioner

## 2023-07-10 MED ORDER — FLUTICASONE-SALMETEROL 100-50 MCG/ACT IN AEPB: 1.0000 | INHALATION_SPRAY | Freq: Two times a day (BID) | RESPIRATORY_TRACT | 3 refills | Status: DC

## 2023-07-15 ENCOUNTER — Telehealth: Payer: Self-pay

## 2023-07-15 NOTE — Telephone Encounter (Signed)
I called and spoke with pharmacist at [facility] and patient has picked up Advair Inhaler but the Qvar is on the manufacture back order.

## 2023-07-15 NOTE — Telephone Encounter (Signed)
Additional information and chart notes were faxed to [phone] at 6:25pm 07/15/2023 at the request of [facility]

## 2023-07-15 NOTE — Telephone Encounter (Signed)
Received a fax regarding Qvar Inhaler.  I called and spoke with pharmacy tech for clarification of received fax and the pharmacy does not have Qvar in stock and request for a different inhaler to be sent in.

## 2023-07-20 NOTE — Telephone Encounter (Signed)
Patient notified of message and is doing just fine using the Advair.

## 2023-07-29 ENCOUNTER — Other Ambulatory Visit (HOSPITAL_COMMUNITY): Payer: Self-pay

## 2023-07-29 DIAGNOSIS — S63502A Unspecified sprain of left wrist, initial encounter: Secondary | ICD-10-CM | POA: Diagnosis not present

## 2023-07-29 MED ORDER — MELOXICAM 15 MG PO TABS
15.0000 mg | ORAL_TABLET | Freq: Every day | ORAL | 0 refills | Status: DC
  Filled 2023-07-29: qty 30, 30d supply, fill #0

## 2023-08-03 ENCOUNTER — Other Ambulatory Visit: Payer: Self-pay

## 2023-08-03 ENCOUNTER — Encounter: Payer: Self-pay | Admitting: Nurse Practitioner

## 2023-08-03 ENCOUNTER — Ambulatory Visit: Payer: BC Managed Care – PPO | Admitting: Nurse Practitioner

## 2023-08-03 VITALS — BP 136/84 | HR 72 | Temp 97.5°F | Ht 61.0 in | Wt 209.0 lb

## 2023-08-03 DIAGNOSIS — E1169 Type 2 diabetes mellitus with other specified complication: Secondary | ICD-10-CM | POA: Diagnosis not present

## 2023-08-03 DIAGNOSIS — Z7984 Long term (current) use of oral hypoglycemic drugs: Secondary | ICD-10-CM

## 2023-08-03 DIAGNOSIS — E669 Obesity, unspecified: Secondary | ICD-10-CM | POA: Diagnosis not present

## 2023-08-03 DIAGNOSIS — R053 Chronic cough: Secondary | ICD-10-CM

## 2023-08-03 DIAGNOSIS — Z7985 Long-term (current) use of injectable non-insulin antidiabetic drugs: Secondary | ICD-10-CM

## 2023-08-03 DIAGNOSIS — I10 Essential (primary) hypertension: Secondary | ICD-10-CM

## 2023-08-03 LAB — BASIC METABOLIC PANEL
BUN: 8 mg/dL (ref 6–23)
CO2: 26 meq/L (ref 19–32)
Calcium: 8.9 mg/dL (ref 8.4–10.5)
Chloride: 104 meq/L (ref 96–112)
Creatinine, Ser: 0.58 mg/dL (ref 0.40–1.20)
GFR: 118.04 mL/min (ref >60.00)
Glucose, Bld: 163 mg/dL — ABNORMAL HIGH (ref 70–99)
Potassium: 3.5 meq/L (ref 3.5–5.1)
Sodium: 137 meq/L (ref 135–145)

## 2023-08-03 MED ORDER — FREESTYLE LIBRE 3 SENSOR MISC
1.0000 | 2 refills | Status: DC
  Filled 2023-08-03: qty 6, 84d supply, fill #0

## 2023-08-03 NOTE — Assessment & Plan Note (Signed)
Chronic, ongoing. Continue ozempic 0.25mg  injection weekly, jardiance 10mg  daily, and metformin XR 500mg  BID. Check BMP today since adding on jardiance. Recommend scheduling yearly eye exam. Follow-up in 3 months.

## 2023-08-03 NOTE — Patient Instructions (Signed)
It was great to see you!  We are checking your labs today and will let you know the results via mychart/phone.   Keep up the great work!   Let's follow-up in 2 months, sooner if you have concerns.  If a referral was placed today, you will be contacted for an appointment. Please note that routine referrals can sometimes take up to 3-4 weeks to process. Please call our office if you haven't heard anything after this time frame.  Take care,  Rodman Pickle, NP

## 2023-08-03 NOTE — Assessment & Plan Note (Signed)
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 We discussed the potential environmental triggers and agreed to continue the inhaled steroid for three months. We will also consider adding Claritin or Zyrtec, especially at night. CXR was normal.

## 2023-08-03 NOTE — Progress Notes (Signed)
Established Patient Office Visit  Subjective   Patient ID: Nicole Stuart, female    DOB: 11/26/1990  Age: 32 y.o. MRN: [id number]  Chief Complaint  Patient presents with   Hypertension    Follow up, no concerns    HPI  Discussed the use of AI scribe software for clinical note transcription with the patient, who gave verbal consent to proceed.  History of Present Illness   The patient, with a history of diabetes and hypertension, presents for follow-up of recent respiratory symptoms. She reports feeling 'basically well now' and has had a normal chest x-ray. She has been experiencing coughing and wheezing, particularly at night, which she initially attributed to a possible environmental cause. She has arranged for duct cleaning in her home as a precaution. Despite feeling better, she continues to use an inhaled steroid, which has caused some concern due to a family member's warning about potential impacts on her diabetes. She has attempted to reduce use of the inhaler but found that her symptoms returned.  In addition to her respiratory symptoms, the patient reports that her blood pressure has been consistently around 120/80 and she is no longer experiencing headaches. She is unsure of her current blood sugar levels and needs a new glucose monitor. She has been taking Ozempic for her diabetes and reports no stomach problems with the medication. She has also planning on going to the gym and hoping to lose 40 pounds over the next several months.        ROS See pertinent positives and negatives per HPI.    Objective:     BP 136/84 (BP Location: Left Arm, Patient Position: Sitting)   Pulse 72   Temp (!) 97.5 F (36.4 C)   Ht 5\' 1"  (1.549 m)   Wt 209 lb (94.8 kg)   LMP 07/23/2023 (Exact Date)   SpO2 99%   BMI 39.49 kg/m  BP Readings from Last 3 Encounters:  08/03/23 136/84  07/06/23 130/70  01/08/23 122/80   Wt Readings from Last 3 Encounters:  08/03/23 209 lb (94.8 kg)   07/06/23 211 lb 9.6 oz (96 kg)  01/08/23 208 lb 9.6 oz (94.6 kg)      Physical Exam Vitals and nursing note reviewed.  Constitutional:      General: She is not in acute distress.    Appearance: Normal appearance.  HENT:     Head: Normocephalic.  Eyes:     Conjunctiva/sclera: Conjunctivae normal.  Cardiovascular:     Rate and Rhythm: Normal rate and regular rhythm.     Pulses: Normal pulses.     Heart sounds: Normal heart sounds.  Pulmonary:     Effort: Pulmonary effort is normal.     Breath sounds: Normal breath sounds.  Musculoskeletal:     Cervical back: Normal range of motion.  Skin:    General: Skin is warm.  Neurological:     General: No focal deficit present.     Mental Status: She is alert and oriented to person, place, and time.  Psychiatric:        Mood and Affect: Mood normal.        Behavior: Behavior normal.        Thought Content: Thought content normal.        Judgment: Judgment normal.      Assessment & Plan:   Problem List Items Addressed This Visit       Cardiovascular and Mediastinum   Essential hypertension - Primary  Her hypertension is well controlled on the current regimen of Losartan HCTZ-Spironolactone 50-12.5mg  daily, amlodipine 10mg  daily, and spironolcatone 25mg  daily. We discussed the role of each medication and their potential effects on potassium levels. We will continue the current regimen and check a BMP to assess kidney function and potassium levels.       Relevant Orders   Basic metabolic panel     Endocrine   Type 2 diabetes mellitus with obesity (HCC)   Chronic, ongoing. Continue ozempic 0.25mg  injection weekly, jardiance 10mg  daily, and metformin XR 500mg  BID. Check BMP today since adding on jardiance. Recommend scheduling yearly eye exam. Follow-up in 3 months.       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 We discussed the potential environmental triggers and agreed to continue the inhaled steroid for three months. We will also consider adding Claritin or Zyrtec, especially at night. CXR was normal.        Return in about 3 months (around 11/01/2023), or if symptoms worsen or fail to improve, for Diabetes, HTN.    Gerre Scull, NP

## 2023-08-03 NOTE — Assessment & Plan Note (Signed)
Her hypertension is well controlled on the current regimen of Losartan HCTZ-Spironolactone 50-12.5mg  daily, amlodipine 10mg  daily, and spironolcatone 25mg  daily. We discussed the role of each medication and their potential effects on potassium levels. We will continue the current regimen and check a BMP to assess kidney function and potassium levels.

## 2023-08-03 NOTE — Assessment & Plan Note (Signed)
Continue Ozempic 0.25 mg injection weekly

## 2023-08-03 NOTE — Assessment & Plan Note (Signed)
Continue metformin XR 500 mg twice a day and jardiance 10mg  daily

## 2023-08-04 ENCOUNTER — Other Ambulatory Visit (HOSPITAL_COMMUNITY): Payer: Self-pay

## 2023-08-04 NOTE — Telephone Encounter (Signed)
Pharmacy Patient Advocate Encounter  Received notification from [facility]  that Prior Authorization for [facility] 2MG /3ML has been APPROVED from 07/07/2023 to 07/06/2024. Unable to obtain price due to refill too soon rejection, last fill date 07/21/23 next available fill date12/24/24   PA #/Case ID/Reference #: [id number]

## 2023-08-05 ENCOUNTER — Other Ambulatory Visit: Payer: Self-pay

## 2023-08-25 ENCOUNTER — Other Ambulatory Visit (HOSPITAL_COMMUNITY): Payer: Self-pay

## 2023-09-12 ENCOUNTER — Other Ambulatory Visit: Payer: Self-pay | Admitting: Nurse Practitioner

## 2023-09-30 ENCOUNTER — Other Ambulatory Visit: Payer: Self-pay | Admitting: Nurse Practitioner

## 2023-09-30 NOTE — Telephone Encounter (Signed)
Requesting: JARDIANCE 10 MG TABLET  Last Visit: 08/03/2023 Next Visit: 11/03/2023 Last Refill: 07/06/2023  Please Advise

## 2023-11-03 ENCOUNTER — Ambulatory Visit: Payer: BC Managed Care – PPO | Admitting: Nurse Practitioner

## 2023-11-29 ENCOUNTER — Other Ambulatory Visit: Payer: Self-pay | Admitting: Nurse Practitioner

## 2023-11-30 NOTE — Telephone Encounter (Signed)
 Requesting: LOSARTAN-HCTZ 50-12.5 MG TAB  Last Visit: 08/03/2023 Next Visit: Visit date not found Last Refill: 09/15/2023  Please Advise

## 2023-12-29 ENCOUNTER — Other Ambulatory Visit: Payer: Self-pay | Admitting: Nurse Practitioner

## 2023-12-29 ENCOUNTER — Other Ambulatory Visit (HOSPITAL_COMMUNITY): Payer: Self-pay

## 2023-12-29 NOTE — Telephone Encounter (Signed)
 Requesting: LOSARTAN -HCTZ 50-12.5 MG TAB  Last Visit: 08/03/2023 Next Visit: Visit date not found Last Refill: 11/30/2023 Due for a follow up  Please Advise

## 2023-12-30 ENCOUNTER — Other Ambulatory Visit (HOSPITAL_COMMUNITY): Payer: Self-pay

## 2024-01-16 ENCOUNTER — Encounter: Payer: Self-pay | Admitting: Nurse Practitioner

## 2024-01-16 ENCOUNTER — Other Ambulatory Visit (HOSPITAL_BASED_OUTPATIENT_CLINIC_OR_DEPARTMENT_OTHER): Payer: Self-pay | Admitting: Family

## 2024-01-16 DIAGNOSIS — I10 Essential (primary) hypertension: Secondary | ICD-10-CM

## 2024-01-18 ENCOUNTER — Encounter (HOSPITAL_COMMUNITY): Payer: Self-pay

## 2024-01-18 ENCOUNTER — Other Ambulatory Visit (HOSPITAL_COMMUNITY): Payer: Self-pay

## 2024-01-18 MED ORDER — AMLODIPINE BESYLATE 10 MG PO TABS
10.0000 mg | ORAL_TABLET | Freq: Every day | ORAL | 0 refills | Status: DC
  Filled 2024-01-18: qty 90, 90d supply, fill #0

## 2024-01-18 NOTE — Telephone Encounter (Signed)
 Requesting: Amlodipine  Last Visit: 08/03/2023 Next Visit: Visit date not found Last Refill: 08/07/2022 by Neomi Banks, NP  Please Advise

## 2024-01-18 NOTE — Telephone Encounter (Signed)
 Left message for patient to return call.

## 2024-01-21 ENCOUNTER — Other Ambulatory Visit: Payer: Self-pay

## 2024-01-21 ENCOUNTER — Other Ambulatory Visit (HOSPITAL_COMMUNITY): Payer: Self-pay

## 2024-01-24 ENCOUNTER — Other Ambulatory Visit: Payer: Self-pay | Admitting: Nurse Practitioner

## 2024-01-26 NOTE — Telephone Encounter (Signed)
 Requesting: JARDIANCE  10 MG TABLET  Last Visit: 08/03/2023 Overdue for an appointment Next Visit: Visit date not found Last Refill: 09/30/2023  Please Advise

## 2024-01-28 ENCOUNTER — Other Ambulatory Visit (HOSPITAL_COMMUNITY): Payer: Self-pay

## 2024-01-28 ENCOUNTER — Ambulatory Visit: Admitting: Nurse Practitioner

## 2024-01-28 ENCOUNTER — Encounter: Payer: Self-pay | Admitting: Nurse Practitioner

## 2024-01-28 VITALS — BP 128/84 | HR 87 | Temp 97.4°F | Ht 61.0 in | Wt 206.6 lb

## 2024-01-28 DIAGNOSIS — Z7984 Long term (current) use of oral hypoglycemic drugs: Secondary | ICD-10-CM

## 2024-01-28 DIAGNOSIS — E1169 Type 2 diabetes mellitus with other specified complication: Secondary | ICD-10-CM

## 2024-01-28 DIAGNOSIS — Z7985 Long-term (current) use of injectable non-insulin antidiabetic drugs: Secondary | ICD-10-CM | POA: Diagnosis not present

## 2024-01-28 DIAGNOSIS — I10 Essential (primary) hypertension: Secondary | ICD-10-CM | POA: Diagnosis not present

## 2024-01-28 DIAGNOSIS — E669 Obesity, unspecified: Secondary | ICD-10-CM

## 2024-01-28 DIAGNOSIS — E785 Hyperlipidemia, unspecified: Secondary | ICD-10-CM | POA: Diagnosis not present

## 2024-01-28 MED ORDER — OZEMPIC (0.25 OR 0.5 MG/DOSE) 2 MG/3ML ~~LOC~~ SOPN
0.2500 mg | PEN_INJECTOR | SUBCUTANEOUS | 3 refills | Status: DC
Start: 2024-01-28 — End: 2024-04-20
  Filled 2024-01-28 – 2024-03-24 (×2): qty 3, 28d supply, fill #0

## 2024-01-28 MED ORDER — AMLODIPINE BESYLATE 10 MG PO TABS
10.0000 mg | ORAL_TABLET | Freq: Every day | ORAL | 1 refills | Status: DC
  Filled 2024-01-28: qty 90, 90d supply, fill #0

## 2024-01-28 MED ORDER — LOSARTAN POTASSIUM-HCTZ 50-12.5 MG PO TABS
1.0000 | ORAL_TABLET | Freq: Every day | ORAL | 1 refills | Status: DC
  Filled 2024-01-28: qty 90, 90d supply, fill #0

## 2024-01-28 NOTE — Progress Notes (Signed)
 Established Patient Office Visit  Subjective   Patient ID: Nicole Stuart, female    DOB: 07-28-1991  Age: 33 y.o. MRN: [id number]  Chief Complaint  Patient presents with   Medication Management    Rx refills, A1C check    HPI Discussed the use of AI scribe software for clinical note transcription with the patient, who gave verbal consent to proceed.  History of Present Illness   The patient presents for medication refill and management of diabetes and hypertension.  She has run out of Amlodipine  and has noticed increased blood pressure. There is no chest pain, shortness of breath, or peripheral neuropathy. She feels well overall and occasionally uses a rescue inhaler.  For diabetes, she is on Ozempic  0.25 mg and Jardiance  10mg  daily but is not checking blood sugars at home. She is interested in her A1c levels and is mostly compliant with her medication. She previously lost 40 pounds on Trulicity and is considering switching back. No nausea or side effects from Ozempic , but a higher dose caused issues due to a hernia.  She is currently out of work, focusing on her son who recently had surgery. She is considering physical activities like walking, yoga, and Zumba. She is working with a nutritionist on a healthy eating plan and is attempting to restrict calories with an Atkins shake in the morning.        ROS See pertinent positives and negatives per HPI.    Objective:     BP 128/84 (BP Location: Left Arm, Patient Position: Sitting, Cuff Size: Large)   Pulse 87   Temp (!) 97.4 F (36.3 C)   Ht 5' 1 (1.549 m)   Wt 206 lb 9.6 oz (93.7 kg)   LMP 01/19/2024 (Exact Date)   SpO2 98%   BMI 39.04 kg/m  BP Readings from Last 3 Encounters:  01/28/24 128/84  08/03/23 136/84  07/06/23 130/70   Wt Readings from Last 3 Encounters:  01/28/24 206 lb 9.6 oz (93.7 kg)  08/03/23 209 lb (94.8 kg)  07/06/23 211 lb 9.6 oz (96 kg)      Physical Exam Vitals and nursing note reviewed.   Constitutional:      General: She is not in acute distress.    Appearance: Normal appearance.  HENT:     Head: Normocephalic.   Eyes:     Conjunctiva/sclera: Conjunctivae normal.    Cardiovascular:     Rate and Rhythm: Normal rate and regular rhythm.     Pulses: Normal pulses.     Heart sounds: Normal heart sounds.  Pulmonary:     Effort: Pulmonary effort is normal.     Breath sounds: Normal breath sounds.   Musculoskeletal:     Cervical back: Normal range of motion.   Skin:    General: Skin is warm.   Neurological:     General: No focal deficit present.     Mental Status: She is alert and oriented to person, place, and time.   Psychiatric:        Mood and Affect: Mood normal.        Behavior: Behavior normal.        Thought Content: Thought content normal.        Judgment: Judgment normal.      Assessment & Plan:   Problem List Items Addressed This Visit       Cardiovascular and Mediastinum   Essential hypertension - Primary   Chronic, stable. Blood pressure has increased after stopping  Amlodipine . Resume Amlodipine  10mg  daily and continue losartan -hctz 50-12.5mg  daily to control blood pressure. Check CMP, CBC today.       Relevant Medications   amLODipine  (NORVASC ) 10 MG tablet   losartan -hydrochlorothiazide  (HYZAAR ) 50-12.5 MG tablet   Other Relevant Orders   CBC with Differential/Platelet (Completed)   Comprehensive metabolic panel with GFR (Completed)     Endocrine   Type 2 diabetes mellitus with obesity (HCC)   Chronic, stable. Diabetes is managed with Ozempic  0.25mg  weekly, metformin  500mg  BID and Jardiance  10mg  daily. Consider switching to Trulicity for weight loss and glycemic control. Current Ozempic  dose is 0.25 mg with no side effects. Check A1c levels.       Relevant Medications   losartan -hydrochlorothiazide  (HYZAAR ) 50-12.5 MG tablet   Semaglutide ,0.25 or 0.5MG /DOS, (OZEMPIC , 0.25 OR 0.5 MG/DOSE,) 2 MG/3ML SOPN   Other Relevant Orders    CBC with Differential/Platelet (Completed)   Comprehensive metabolic panel with GFR (Completed)   Hemoglobin A1c (Completed)   Hyperlipidemia associated with type 2 diabetes mellitus (HCC)   Chronic, stable. Check CMP, CBC, lipid panel today and treat based on results.       Relevant Medications   amLODipine  (NORVASC ) 10 MG tablet   losartan -hydrochlorothiazide  (HYZAAR ) 50-12.5 MG tablet   Semaglutide ,0.25 or 0.5MG /DOS, (OZEMPIC , 0.25 OR 0.5 MG/DOSE,) 2 MG/3ML SOPN   Other Relevant Orders   CBC with Differential/Platelet (Completed)   Comprehensive metabolic panel with GFR (Completed)   Lipid panel (Completed)     Other   Long term current use of oral hypoglycemic drug   Continue metformin  XR 500 mg twice a day and jardiance  10mg  daily      Long-term current use of injectable noninsulin antidiabetic medication   Continue Ozempic  0.25 mg injection weekly       Return in about 6 months (around 07/29/2024) for CPE.    Odette Benjamin, NP

## 2024-01-28 NOTE — Patient Instructions (Signed)
 It was great to see you!  We are checking your labs today and will let you know the results via mychart/phone.   I have sent in refills for you   Let's follow-up in 6 months, sooner if you have concerns.  If a referral was placed today, you will be contacted for an appointment. Please note that routine referrals can sometimes take up to 3-4 weeks to process. Please call our office if you haven't heard anything after this time frame.  Take care,  Rheba Cedar, NP

## 2024-01-29 ENCOUNTER — Other Ambulatory Visit: Payer: Self-pay

## 2024-01-29 ENCOUNTER — Other Ambulatory Visit (HOSPITAL_COMMUNITY): Payer: Self-pay

## 2024-01-29 ENCOUNTER — Ambulatory Visit: Payer: Self-pay | Admitting: Nurse Practitioner

## 2024-01-29 LAB — CBC WITH DIFFERENTIAL/PLATELET
Absolute Lymphocytes: 4465 {cells}/uL — ABNORMAL HIGH (ref 850–3900)
Absolute Monocytes: 554 {cells}/uL (ref 200–950)
Basophils Absolute: 62 {cells}/uL (ref 0–200)
Basophils Relative: 0.5 %
Eosinophils Absolute: 197 {cells}/uL (ref 15–500)
Eosinophils Relative: 1.6 %
HCT: 36.8 % (ref 35.0–45.0)
Hemoglobin: 11.9 g/dL (ref 11.7–15.5)
MCH: 23.9 pg — ABNORMAL LOW (ref 27.0–33.0)
MCHC: 32.3 g/dL (ref 32.0–36.0)
MCV: 74 fL — ABNORMAL LOW (ref 80.0–100.0)
MPV: 9.6 fL (ref 7.5–12.5)
Monocytes Relative: 4.5 %
Neutro Abs: 7023 {cells}/uL (ref 1500–7800)
Neutrophils Relative %: 57.1 %
Platelets: 406 10*3/uL — ABNORMAL HIGH (ref 140–400)
RBC: 4.97 10*6/uL (ref 3.80–5.10)
RDW: 17.4 % — ABNORMAL HIGH (ref 11.0–15.0)
Total Lymphocyte: 36.3 %
WBC: 12.3 10*3/uL — ABNORMAL HIGH (ref 3.8–10.8)

## 2024-01-29 LAB — LIPID PANEL
Cholesterol: 165 mg/dL (ref <200)
HDL: 36 mg/dL — ABNORMAL LOW (ref >=50)
LDL Cholesterol (Calc): 106 mg/dL — ABNORMAL HIGH
Non-HDL Cholesterol (Calc): 129 mg/dL (ref <130)
Total CHOL/HDL Ratio: 4.6 (calc) (ref <5.0)
Triglycerides: 126 mg/dL (ref <150)

## 2024-01-29 LAB — COMPREHENSIVE METABOLIC PANEL WITH GFR
AG Ratio: 1.2 (calc) (ref 1.0–2.5)
ALT: 14 U/L (ref 6–29)
AST: 13 U/L (ref 10–30)
Albumin: 4 g/dL (ref 3.6–5.1)
Alkaline phosphatase (APISO): 130 U/L — ABNORMAL HIGH (ref 31–125)
BUN: 10 mg/dL (ref 7–25)
CO2: 27 mmol/L (ref 20–32)
Calcium: 9.5 mg/dL (ref 8.6–10.2)
Chloride: 100 mmol/L (ref 98–110)
Creat: 0.62 mg/dL (ref 0.50–0.97)
Globulin: 3.3 g/dL (ref 1.9–3.7)
Glucose, Bld: 119 mg/dL — ABNORMAL HIGH (ref 65–99)
Potassium: 4 mmol/L (ref 3.5–5.3)
Sodium: 137 mmol/L (ref 135–146)
Total Bilirubin: 0.3 mg/dL (ref 0.2–1.2)
Total Protein: 7.3 g/dL (ref 6.1–8.1)
eGFR: 120 mL/min/{1.73_m2} (ref >=60)

## 2024-01-29 LAB — HEMOGLOBIN A1C
Hgb A1c MFr Bld: 7 % — ABNORMAL HIGH (ref <5.7)
Mean Plasma Glucose: 154 mg/dL
eAG (mmol/L): 8.5 mmol/L

## 2024-01-29 NOTE — Assessment & Plan Note (Signed)
 Chronic, stable. Blood pressure has increased after stopping Amlodipine . Resume Amlodipine  10mg  daily and continue losartan -hctz 50-12.5mg  daily to control blood pressure. Check CMP, CBC today.

## 2024-01-29 NOTE — Assessment & Plan Note (Signed)
Chronic, stable.  Check CMP, CBC, lipid panel today and treat based on results.

## 2024-01-29 NOTE — Assessment & Plan Note (Addendum)
 Chronic, stable. Diabetes is managed with Ozempic  0.25mg  weekly, metformin  500mg  BID and Jardiance  10mg  daily. Consider switching to Trulicity for weight loss and glycemic control. Current Ozempic  dose is 0.25 mg with no side effects. Check A1c levels.

## 2024-01-29 NOTE — Assessment & Plan Note (Signed)
Continue Ozempic 0.25 mg injection weekly

## 2024-01-29 NOTE — Assessment & Plan Note (Signed)
 Continue metformin XR 500 mg twice a day and jardiance 10mg  daily

## 2024-03-24 ENCOUNTER — Other Ambulatory Visit (HOSPITAL_COMMUNITY): Payer: Self-pay

## 2024-03-25 ENCOUNTER — Telehealth (HOSPITAL_COMMUNITY): Payer: Self-pay

## 2024-03-25 ENCOUNTER — Other Ambulatory Visit (HOSPITAL_COMMUNITY): Payer: Self-pay

## 2024-03-25 NOTE — Telephone Encounter (Signed)
 Pharmacy Patient Advocate Encounter   Received notification from Patient Pharmacy that prior authorization for Ozempic  (0.25 or 0.5 MG/DOSE) 2MG /3ML pen-injectors  is required/requested.   Insurance verification completed.   The patient is insured through [facility] MEDICAID .   Per test claim: PA required; PA submitted to above mentioned insurance via CoverMyMeds Key/confirmation #/EOC [id number] Status is pending

## 2024-03-25 NOTE — Telephone Encounter (Signed)
 Pharmacy Patient Advocate Encounter  Received notification from [facility] MEDICAID that Prior Authorization for Ozempic  (0.25 or 0.5 MG/DOSE) 2MG /3ML pen-injectors  has been APPROVED from 03/25/24 to 03/25/25. Ran test claim, Copay is $4. This test claim was processed through [facility]- copay amounts may vary at other pharmacies due to pharmacy/plan contracts, or as the patient moves through the different stages of their insurance plan.   PA #/Case ID/Reference #:  [id number]

## 2024-04-14 ENCOUNTER — Other Ambulatory Visit (HOSPITAL_COMMUNITY): Payer: Self-pay

## 2024-04-17 ENCOUNTER — Other Ambulatory Visit (HOSPITAL_COMMUNITY): Payer: Self-pay

## 2024-04-19 ENCOUNTER — Other Ambulatory Visit (HOSPITAL_COMMUNITY): Payer: Self-pay

## 2024-04-20 ENCOUNTER — Encounter: Payer: Self-pay | Admitting: Nurse Practitioner

## 2024-04-20 ENCOUNTER — Other Ambulatory Visit: Payer: Self-pay

## 2024-04-20 ENCOUNTER — Telehealth (HOSPITAL_COMMUNITY): Payer: Self-pay

## 2024-04-20 ENCOUNTER — Other Ambulatory Visit (HOSPITAL_COMMUNITY): Payer: Self-pay

## 2024-04-20 ENCOUNTER — Other Ambulatory Visit: Payer: Self-pay | Admitting: Nurse Practitioner

## 2024-04-20 DIAGNOSIS — I10 Essential (primary) hypertension: Secondary | ICD-10-CM

## 2024-04-20 MED ORDER — AMLODIPINE BESYLATE 10 MG PO TABS
10.0000 mg | ORAL_TABLET | Freq: Every day | ORAL | 1 refills | Status: DC
  Filled 2024-04-20: qty 90, 90d supply, fill #0
  Filled 2024-08-28: qty 90, 90d supply, fill #1

## 2024-04-20 MED ORDER — FLUTICASONE-SALMETEROL 100-50 MCG/ACT IN AEPB
1.0000 | INHALATION_SPRAY | Freq: Two times a day (BID) | RESPIRATORY_TRACT | 3 refills | Status: AC
  Filled 2024-04-20: qty 60, 30d supply, fill #0
  Filled 2024-06-21: qty 60, 30d supply, fill #1
  Filled 2024-09-22: qty 60, 30d supply, fill #2

## 2024-04-20 MED ORDER — OZEMPIC (0.25 OR 0.5 MG/DOSE) 2 MG/3ML ~~LOC~~ SOPN
0.2500 mg | PEN_INJECTOR | SUBCUTANEOUS | 3 refills | Status: DC
  Filled 2024-04-20: qty 3, 28d supply, fill #0
  Filled 2024-05-24: qty 3, 28d supply, fill #1
  Filled 2024-06-21: qty 3, 28d supply, fill #2
  Filled 2024-08-28: qty 3, 28d supply, fill #3

## 2024-04-20 MED ORDER — SPIRONOLACTONE 25 MG PO TABS
25.0000 mg | ORAL_TABLET | Freq: Every day | ORAL | 3 refills | Status: DC
  Filled 2024-04-20: qty 90, 90d supply, fill #0
  Filled 2024-08-28: qty 90, 90d supply, fill #1

## 2024-04-20 MED ORDER — EMPAGLIFLOZIN 10 MG PO TABS
10.0000 mg | ORAL_TABLET | Freq: Every day | ORAL | 2 refills | Status: DC
  Filled 2024-04-20 (×2): qty 30, 30d supply, fill #0
  Filled 2024-06-21: qty 30, 30d supply, fill #1
  Filled 2024-08-28: qty 30, 30d supply, fill #2

## 2024-04-20 MED ORDER — METFORMIN HCL ER 500 MG PO TB24
500.0000 mg | ORAL_TABLET | Freq: Two times a day (BID) | ORAL | 3 refills | Status: DC
  Filled 2024-04-20: qty 60, 30d supply, fill #0
  Filled 2024-08-28: qty 60, 30d supply, fill #1

## 2024-04-20 MED ORDER — LOSARTAN POTASSIUM-HCTZ 50-12.5 MG PO TABS
1.0000 | ORAL_TABLET | Freq: Every day | ORAL | 1 refills | Status: DC
  Filled 2024-04-20: qty 90, 90d supply, fill #0
  Filled 2024-08-28: qty 90, 90d supply, fill #1

## 2024-04-20 NOTE — Telephone Encounter (Signed)
 PA request has been Received. New Encounter has been or will be created for follow up. For additional info see Pharmacy Prior Auth telephone encounter from 04/20/24.

## 2024-04-20 NOTE — Telephone Encounter (Signed)
 Pharmacy Patient Advocate Encounter  Received notification from [facility] MEDICAID that Prior Authorization for Jardiance  10MG  tablets  has been APPROVED from 04/20/24 to 04/20/25. Ran test claim, Copay is $4. This test claim was processed through [facility]- copay amounts may vary at other pharmacies due to pharmacy/plan contracts, or as the patient moves through the different stages of their insurance plan.   PA #/Case ID/Reference #: [id number]

## 2024-04-20 NOTE — Telephone Encounter (Signed)
 Copied from CRM #[id number]. Topic: Clinical - Medication Refill >> Apr 20, 2024  9:41 AM Chiquita SQUIBB wrote: Medication:  amLODipine  amLODipine  (NORVASC ) 10 MG tablet  Ozempic  (0.25 or 0.5 MG/DOSE) Semaglutide ,0.25 or 0.5MG /DOS, (OZEMPIC , 0.25 OR 0.5 MG/DOSE,) 2 MG/3ML SOPN   losartan -hydrochlorothiazide  losartan -hydrochlorothiazide  (HYZAAR ) 50-12.5 MG tablet   fluticasone -salmeterol fluticasone -salmeterol (ADVAIR ) 100-50 MCG/ACT AEPB   spironolactone  Expired - spironolactone  (ALDACTONE ) 25 MG tablet   Has the patient contacted their pharmacy? Yes (Agent: If no, request that the patient contact the pharmacy for the refill. If patient does not wish to contact the pharmacy document the reason why and proceed with request.) (Agent: If yes, when and what did the pharmacy advise?)  This is the patient's preferred pharmacy:  [place] [facility] [address] [place] [address] Phone: [phone] Fax: [phone]   Is this the correct pharmacy for this prescription? Yes If no, delete pharmacy and type the correct one.   Has the prescription been filled recently? No  Is the patient out of the medication? Yes  Has the patient been seen for an appointment in the last year OR does the patient have an upcoming appointment? Yes  Can we respond through MyChart? Yes  Agent: Please be advised that Rx refills may take up to 3 business days. We ask that you follow-up with your pharmacy.

## 2024-04-20 NOTE — Addendum Note (Signed)
 Addended by: GLADIS CLAUDENE GRATE Y on: 04/20/2024 10:49 AM   Modules accepted: Orders

## 2024-04-20 NOTE — Telephone Encounter (Signed)
 Pharmacy Patient Advocate Encounter   Received notification from [facility] that prior authorization for Jardiance  10MG  tablets  is required/requested.   Insurance verification completed.   The patient is insured through [facility] MEDICAID .   Per test claim: PA required; PA submitted to above mentioned insurance via Latent Key/confirmation #/EOC [id number] Status is pending

## 2024-04-20 NOTE — Telephone Encounter (Signed)
 Requesting: amlodipine , Ozempic , losartan -hydrochlorothiazide , fluticasone -salmeterol,  spironolactone , Metformin , Jardiance  Last Visit: 01/28/2024 Next Visit: Visit date not found Last Refill: 6/125/25,01/28/24, 01/28/2024,07/10/23, 08/07/22 by Reche Finder, NP,  03/02/23 by Harlene Bill, 6/10/225  Please Advise

## 2024-04-22 ENCOUNTER — Other Ambulatory Visit (HOSPITAL_COMMUNITY): Payer: Self-pay

## 2024-05-18 ENCOUNTER — Telehealth: Admitting: Nurse Practitioner

## 2024-05-18 ENCOUNTER — Other Ambulatory Visit: Payer: Self-pay

## 2024-05-18 ENCOUNTER — Other Ambulatory Visit (HOSPITAL_COMMUNITY): Payer: Self-pay

## 2024-05-18 ENCOUNTER — Encounter: Payer: Self-pay | Admitting: Nurse Practitioner

## 2024-05-18 VITALS — Ht 60.0 in | Wt 201.0 lb

## 2024-05-18 DIAGNOSIS — H1031 Unspecified acute conjunctivitis, right eye: Secondary | ICD-10-CM

## 2024-05-18 DIAGNOSIS — Z23 Encounter for immunization: Secondary | ICD-10-CM | POA: Diagnosis not present

## 2024-05-18 MED ORDER — OFLOXACIN 0.3 % OP SOLN
1.0000 [drp] | Freq: Four times a day (QID) | OPHTHALMIC | 0 refills | Status: AC
  Filled 2024-05-18 – 2024-05-19 (×2): qty 5, 25d supply, fill #0

## 2024-05-18 NOTE — Patient Instructions (Signed)
 It was great to see you!  Start ofloxacin eye drops 4 times a day in the right eye for 5-7 days  You can use cool compresses to help with itching  Try not to rub your eyes  You should throw out any make-up brushes that you use near your eyes.   Let's follow-up if symptoms worsen or don't improve   Take care,  Tinnie Harada, NP

## 2024-05-18 NOTE — Assessment & Plan Note (Signed)
 Prescribe ofloxacin eye drops for the right eye, 4 times daily for 5-7 days. Use drops in the left eye if symptoms develop. Advise against rubbing the eye and recommend discarding and replacing eye makeup brushes. Keep ophthalmology appointment in two weeks.

## 2024-05-18 NOTE — Progress Notes (Signed)
 [facility] [address] [place] [address] Dept: [phone] Dept Fax: [phone]  Virtual Video Visit  I connected with Nicole Stuart on 05/18/24 at  2:20 PM EDT by a video enabled telemedicine application and verified that I am speaking with the correct person using two identifiers.  Location patient: Home Location provider: Clinic Persons participating in the virtual visit: Patient; Tinnie Harada, NP; Laymon Gladis Sharps, CMA  I discussed the limitations of evaluation and management by telemedicine and the availability of in person appointments. The patient expressed understanding and agreed to proceed.  Chief Complaint  Patient presents with   Eye Problem    Red,itchy eyes for 4 days    SUBJECTIVE:  HPI:  Discussed the use of AI scribe software for clinical note transcription with the patient, who gave verbal consent to proceed.  History of Present Illness   Nicole Stuart is a 33 year old female who presents with right eye irritation and excessive tearing.  She has experienced right eye irritation and excessive tearing for four days following exposure to chlorine during a water aerobics class. The tearing is clear and watery with significant itching. There is no pain or visual disturbances, and the left eye is unaffected. She initially suspected dry eye and used over-the-counter eye drops. No nasal congestion, runny nose, or sore throat. She does not wear contact lenses and has no vision problems.      Patient Active Problem List   Diagnosis Date Noted   Acute conjunctivitis of right eye 05/18/2024   Hyperlipidemia associated with type 2 diabetes mellitus (HCC) 07/06/2023   Chronic cough 07/06/2023   Umbilical hernia without obstruction and without gangrene 01/08/2023   Chronic midline low back pain without sciatica 01/08/2023   Long term current use of oral hypoglycemic drug 01/08/2023   Long-term current  use of injectable noninsulin antidiabetic medication 01/08/2023   Type 2 diabetes mellitus with obesity 11/02/2020   Essential hypertension 11/02/2020   Patent ductus arteriosus 03/07/2019   Heart murmur 10/11/2014   Obesity (BMI 30-39.9) 10/11/2014    Past Surgical History:  Procedure Laterality Date   CESAREAN SECTION     CESAREAN SECTION N/A 05/08/2021   Procedure: CESAREAN SECTION;  Surgeon: Rutherford Gain, MD;  Location: [facility] ORS;  Service: Obstetrics;  Laterality: N/A;   NASAL SEPTOPLASTY W/ TURBINOPLASTY  09/22/2011   Procedure: NASAL SEPTOPLASTY WITH TURBINATE REDUCTION;  Surgeon: Lonni FORBES Angle, MD;  Location: [facility] OR;  Service: ENT;  Laterality: Bilateral;   TONSILLECTOMY  09/22/2011   Procedure: TONSILLECTOMY;  Surgeon: Lonni FORBES Angle, MD;  Location: [facility] OR;  Service: ENT;  Laterality: Bilateral;    Family History  Adopted: Yes  Problem Relation Age of Onset   Diabetes Mother    Obesity Mother    Hypertension Sister    Hypertension Brother     Social History   Tobacco Use   Smoking status: Never   Smokeless tobacco: Never  Vaping Use   Vaping status: Never Used  Substance Use Topics   Alcohol use: No   Drug use: No     Current Outpatient Medications:    albuterol  (VENTOLIN  HFA) 108 (90 Base) MCG/ACT inhaler, Inhale 2 puffs into the lungs every 6 (six) hours as needed for wheezing or shortness of breath., Disp: 6.7 g, Rfl: 0   amLODipine  (NORVASC ) 10 MG tablet, Take 1 tablet (10 mg total) by mouth daily., Disp: 90 tablet, Rfl: 1   Blood  Glucose Monitoring Suppl (FREESTYLE LITE) w/Device KIT, Use 2 times daily as directed (Patient taking differently: as needed.), Disp: 1 kit, Rfl: 0   cyclobenzaprine  (FLEXERIL ) 10 MG tablet, Take 1 tablet (10 mg total) by mouth 3 (three) times daily as needed for muscle spasms., Disp: 30 tablet, Rfl: 1   docusate sodium  (COLACE) 100 MG capsule, Take 1 capsule (100 mg total) by mouth 2 (two) times daily as needed for  mild constipation., Disp: 30 capsule, Rfl: 0   empagliflozin  (JARDIANCE ) 10 MG TABS tablet, Take 1 tablet (10 mg total) by mouth daily before breakfast., Disp: 30 tablet, Rfl: 2   fluticasone -salmeterol (ADVAIR ) 100-50 MCG/ACT AEPB, Inhale 1 puff into the lungs 2 (two) times daily., Disp: 60 each, Rfl: 3   Glucose Blood (BLOOD GLUCOSE TEST STRIPS) STRP, Check blood sugar twice daily (Patient taking differently: as needed.), Disp: 100 strip, Rfl: 5   Lancets (FREESTYLE) lancets, Use 2 times daily as directed (Patient taking differently: as needed.), Disp: 100 each, Rfl: 5   losartan -hydrochlorothiazide  (HYZAAR ) 50-12.5 MG tablet, Take 1 tablet by mouth daily., Disp: 90 tablet, Rfl: 1   metFORMIN  (GLUCOPHAGE -XR) 500 MG 24 hr tablet, Take 1 tablet (500 mg total) by mouth 2 (two) times daily., Disp: 60 tablet, Rfl: 3   ofloxacin (OCUFLOX) 0.3 % ophthalmic solution, Place 1 drop into the right eye 4 (four) times daily., Disp: 5 mL, Rfl: 0   Semaglutide ,0.25 or 0.5MG /DOS, (OZEMPIC , 0.25 OR 0.5 MG/DOSE,) 2 MG/3ML SOPN, Inject 0.25 mg into the skin once a week., Disp: 3 mL, Rfl: 3   spironolactone  (ALDACTONE ) 25 MG tablet, Take 1 tablet (25 mg total) by mouth daily., Disp: 90 tablet, Rfl: 3   benzonatate  (TESSALON ) 100 MG capsule, Take 1 capsule (100 mg total) by mouth 3 (three) times daily as needed for cough. (Patient not taking: Reported on 05/18/2024), Disp: 30 capsule, Rfl: 0   Continuous Glucose Sensor (FREESTYLE LIBRE 3 SENSOR) MISC, Use 1 each every 14 (fourteen) days. (Patient not taking: Reported on 05/18/2024), Disp: 6 each, Rfl: 2   hydrOXYzine  (VISTARIL ) 50 MG capsule, Take 50 mg by mouth as needed. (Patient not taking: Reported on 05/18/2024), Disp: , Rfl:    ibuprofen  (ADVIL ) 800 MG tablet, Take 1 tablet every 8 hours as needed for pain (Patient not taking: Reported on 05/18/2024), Disp: 20 tablet, Rfl: 1   meloxicam  (MOBIC ) 15 MG tablet, Take 1 tablet (15 mg total) by mouth daily. (Patient not  taking: Reported on 05/18/2024), Disp: 90 tablet, Rfl: 0   naproxen  (NAPROSYN ) 500 MG tablet, Take 1 tablet (500 mg total) by mouth 2 (two) times daily with a meal. (Patient not taking: Reported on 05/18/2024), Disp: 60 tablet, Rfl: 1  Allergies  Allergen Reactions   Chlorhexidine  Rash    ROS: See pertinent positives and negatives per HPI.  OBSERVATIONS/OBJECTIVE:  VITALS per patient if applicable: Today's Vitals   05/18/24 1411  Weight: 201 lb (91.2 kg)  Height: 5' (1.524 m)   Body mass index is 39.26 kg/m.    GENERAL: Alert and oriented. Appears well and in no acute distress.  HEENT: Atraumatic. Right conjunctiva red. No obvious abnormalities on inspection of external nose and ears.  NECK: Normal movements of the head and neck.  LUNGS: On inspection, no signs of respiratory distress. Breathing rate appears normal. No obvious gross SOB, gasping or wheezing, and no conversational dyspnea.  CV: No obvious cyanosis.  MS: Moves all visible extremities without noticeable abnormality.  PSYCH/NEURO: Pleasant and cooperative.  No obvious depression or anxiety. Speech and thought processing grossly intact.  ASSESSMENT AND PLAN:  Problem List Items Addressed This Visit       Other   Acute conjunctivitis of right eye - Primary   Prescribe ofloxacin eye drops for the right eye, 4 times daily for 5-7 days. Use drops in the left eye if symptoms develop. Advise against rubbing the eye and recommend discarding and replacing eye makeup brushes. Keep ophthalmology appointment in two weeks.      Other Visit Diagnoses       Immunization due       Schedule nurse visit for flu and prevnar 20 vaccines.        I discussed the assessment and treatment plan with the patient. The patient was provided an opportunity to ask questions and all were answered. The patient agreed with the plan and demonstrated an understanding of the instructions.   The patient was advised to call back or seek  an in-person evaluation if the symptoms worsen or if the condition fails to improve as anticipated.   Tinnie DELENA Harada, NP

## 2024-05-19 ENCOUNTER — Other Ambulatory Visit (HOSPITAL_COMMUNITY): Payer: Self-pay

## 2024-05-24 ENCOUNTER — Encounter: Payer: Self-pay | Admitting: Pharmacist

## 2024-05-24 ENCOUNTER — Other Ambulatory Visit (HOSPITAL_COMMUNITY): Payer: Self-pay

## 2024-05-24 ENCOUNTER — Other Ambulatory Visit: Payer: Self-pay

## 2024-06-01 ENCOUNTER — Ambulatory Visit (INDEPENDENT_AMBULATORY_CARE_PROVIDER_SITE_OTHER)

## 2024-06-01 DIAGNOSIS — Z23 Encounter for immunization: Secondary | ICD-10-CM | POA: Diagnosis not present

## 2024-06-01 NOTE — Progress Notes (Signed)
 After obtaining informed consent, Flu and Prevnar 20 was given by Flonnie Norse, CMA II.   Pt. Tolerated well.

## 2024-06-02 ENCOUNTER — Ambulatory Visit

## 2024-06-13 ENCOUNTER — Other Ambulatory Visit (HOSPITAL_COMMUNITY): Payer: Self-pay

## 2024-06-21 ENCOUNTER — Other Ambulatory Visit: Payer: Self-pay

## 2024-08-29 ENCOUNTER — Other Ambulatory Visit (HOSPITAL_COMMUNITY): Payer: Self-pay

## 2024-08-29 ENCOUNTER — Other Ambulatory Visit: Payer: Self-pay

## 2024-08-30 ENCOUNTER — Other Ambulatory Visit: Payer: Self-pay

## 2024-08-30 ENCOUNTER — Other Ambulatory Visit (HOSPITAL_COMMUNITY): Payer: Self-pay

## 2024-09-21 ENCOUNTER — Other Ambulatory Visit: Payer: Self-pay

## 2024-09-21 ENCOUNTER — Ambulatory Visit: Admitting: Nurse Practitioner

## 2024-09-21 ENCOUNTER — Other Ambulatory Visit (HOSPITAL_COMMUNITY): Payer: Self-pay

## 2024-09-21 ENCOUNTER — Encounter: Payer: Self-pay | Admitting: Nurse Practitioner

## 2024-09-21 VITALS — BP 122/72 | HR 94 | Temp 97.3°F | Ht 60.0 in | Wt 192.4 lb

## 2024-09-21 DIAGNOSIS — I10 Essential (primary) hypertension: Secondary | ICD-10-CM

## 2024-09-21 DIAGNOSIS — E119 Type 2 diabetes mellitus without complications: Secondary | ICD-10-CM

## 2024-09-21 DIAGNOSIS — Z7985 Long-term (current) use of injectable non-insulin antidiabetic drugs: Secondary | ICD-10-CM

## 2024-09-21 DIAGNOSIS — F411 Generalized anxiety disorder: Secondary | ICD-10-CM | POA: Insufficient documentation

## 2024-09-21 DIAGNOSIS — E1169 Type 2 diabetes mellitus with other specified complication: Secondary | ICD-10-CM

## 2024-09-21 DIAGNOSIS — Z7984 Long term (current) use of oral hypoglycemic drugs: Secondary | ICD-10-CM

## 2024-09-21 LAB — CBC WITH DIFFERENTIAL/PLATELET
Basophils Absolute: 0 10*3/uL (ref 0.0–0.1)
Basophils Relative: 0.4 % (ref 0.0–3.0)
Eosinophils Absolute: 0.2 10*3/uL (ref 0.0–0.7)
Eosinophils Relative: 2.5 % (ref 0.0–5.0)
HCT: 36.7 % (ref 36.0–46.0)
Hemoglobin: 12 g/dL (ref 12.0–15.0)
Lymphocytes Relative: 32.3 % (ref 12.0–46.0)
Lymphs Abs: 3.1 10*3/uL (ref 0.7–4.0)
MCHC: 32.8 g/dL (ref 30.0–36.0)
MCV: 72.5 fl — ABNORMAL LOW (ref 78.0–100.0)
Monocytes Absolute: 0.3 10*3/uL (ref 0.1–1.0)
Monocytes Relative: 3.6 % (ref 3.0–12.0)
Neutro Abs: 5.9 10*3/uL (ref 1.4–7.7)
Neutrophils Relative %: 61.2 % (ref 43.0–77.0)
Platelets: 454 10*3/uL — ABNORMAL HIGH (ref 150.0–400.0)
RBC: 5.06 Mil/uL (ref 3.87–5.11)
RDW: 18.1 % — ABNORMAL HIGH (ref 11.5–15.5)
WBC: 9.6 10*3/uL (ref 4.0–10.5)

## 2024-09-21 LAB — COMPREHENSIVE METABOLIC PANEL WITH GFR
ALT: 16 U/L (ref 3–35)
AST: 12 U/L (ref 5–37)
Albumin: 4 g/dL (ref 3.5–5.2)
Alkaline Phosphatase: 103 U/L (ref 39–117)
BUN: 9 mg/dL (ref 6–23)
CO2: 29 meq/L (ref 19–32)
Calcium: 9.6 mg/dL (ref 8.4–10.5)
Chloride: 102 meq/L (ref 96–112)
Creatinine, Ser: 0.64 mg/dL (ref 0.40–1.20)
GFR: 114.35 mL/min
Glucose, Bld: 127 mg/dL — ABNORMAL HIGH (ref 70–99)
Potassium: 3.5 meq/L (ref 3.5–5.1)
Sodium: 140 meq/L (ref 135–145)
Total Bilirubin: 0.4 mg/dL (ref 0.2–1.2)
Total Protein: 7.8 g/dL (ref 6.0–8.3)

## 2024-09-21 LAB — LIPID PANEL
Cholesterol: 115 mg/dL (ref 28–200)
HDL: 33.8 mg/dL — ABNORMAL LOW
LDL Cholesterol: 68 mg/dL (ref 10–99)
NonHDL: 81.35
Total CHOL/HDL Ratio: 3
Triglycerides: 65 mg/dL (ref 10.0–149.0)
VLDL: 13 mg/dL (ref 0.0–40.0)

## 2024-09-21 LAB — VITAMIN D 25 HYDROXY (VIT D DEFICIENCY, FRACTURES): VITD: 8.03 ng/mL — ABNORMAL LOW (ref 30.00–100.00)

## 2024-09-21 LAB — HEMOGLOBIN A1C: Hgb A1c MFr Bld: 5.8 % (ref 4.6–6.5)

## 2024-09-21 LAB — TSH: TSH: 0.82 u[IU]/mL (ref 0.35–5.50)

## 2024-09-21 MED ORDER — FREESTYLE LIBRE 3 SENSOR MISC
1.0000 | 2 refills | Status: AC
  Filled 2024-09-21: qty 2, 28d supply, fill #0

## 2024-09-21 MED ORDER — AMLODIPINE BESYLATE 10 MG PO TABS
10.0000 mg | ORAL_TABLET | Freq: Every day | ORAL | 1 refills | Status: AC
  Filled 2024-09-21: qty 90, 90d supply, fill #0

## 2024-09-21 MED ORDER — METFORMIN HCL ER 500 MG PO TB24
500.0000 mg | ORAL_TABLET | Freq: Two times a day (BID) | ORAL | 1 refills | Status: AC
  Filled 2024-09-21: qty 180, 90d supply, fill #0

## 2024-09-21 MED ORDER — SPIRONOLACTONE 25 MG PO TABS
25.0000 mg | ORAL_TABLET | Freq: Every day | ORAL | 3 refills | Status: AC
  Filled 2024-09-21: qty 90, 90d supply, fill #0

## 2024-09-21 MED ORDER — EMPAGLIFLOZIN 10 MG PO TABS
10.0000 mg | ORAL_TABLET | Freq: Every day | ORAL | 1 refills | Status: AC
  Filled 2024-09-21: qty 90, 90d supply, fill #0
  Filled ????-??-??: fill #0

## 2024-09-21 MED ORDER — LOSARTAN POTASSIUM-HCTZ 50-12.5 MG PO TABS
1.0000 | ORAL_TABLET | Freq: Every day | ORAL | 1 refills | Status: AC
  Filled 2024-09-21: qty 90, 90d supply, fill #0

## 2024-09-21 MED ORDER — SEMAGLUTIDE (1 MG/DOSE) 4 MG/3ML ~~LOC~~ SOPN
1.0000 mg | PEN_INJECTOR | SUBCUTANEOUS | 1 refills | Status: AC
  Filled 2024-09-21: qty 3, 28d supply, fill #0

## 2024-09-21 NOTE — Assessment & Plan Note (Addendum)
 Chronic, stable. She is talking with a therapist, recommend she continue this. Discussed meditation, deep breathing, and drinking camomile or herbal tea in the evening. Follow-up with any concerns. Check TSH and vitamin D  today.

## 2024-09-21 NOTE — Assessment & Plan Note (Signed)
Chronic, stable.  Check CMP, CBC, lipid panel today and treat based on results.

## 2024-09-21 NOTE — Assessment & Plan Note (Signed)
 Chronic, stable. Continue Amlodipine  10mg  daily and losartan -hctz 50-12.5mg  daily to control blood pressure. Check CMP, CBC today.

## 2024-09-21 NOTE — Assessment & Plan Note (Addendum)
 Increase ozempic to 1 mg weekly

## 2024-09-21 NOTE — Patient Instructions (Signed)
 It was great to see you!  We are checking your labs today and will let you know the results via mychart/phone.   Let's increase your ozempic  to 1mg  weekly  Make sure you are eating plenty of protein and drink water  Let's follow-up in 3 months, sooner if you have concerns.  If a referral was placed today, you will be contacted for an appointment. Please note that routine referrals can sometimes take up to 3-4 weeks to process. Please call our office if you haven't heard anything after this time frame.  Take care,  Tinnie Harada, NP

## 2024-09-21 NOTE — Assessment & Plan Note (Signed)
 Continue metformin XR 500 mg twice a day and jardiance 10mg  daily

## 2024-09-21 NOTE — Progress Notes (Signed)
 "   Established Patient Office Visit Subjective:    Patient ID: Nicole Stuart, female    DOB: 25-Jul-1991, 34 y.o.   MRN: [id number]  Chief Complaint Chief Complaint  Patient presents with   Hypertension    Follow up, request labs to check A1C and Cholesterol    HPI Nicole Stuart is a 33 y.o. female, with a history of hypertension and diabetes, who presents for management of chronic conditions.  Discussed the use of AI scribe software for clinical note transcription with the patient, who gave verbal consent to proceed.  She reports worsening anxiety since her husband was hospitalized for an occluded artery, with nocturnal panic attacks, shortness of breath, racing heart, and difficulty falling asleep. She is talking with a therapist.   She manages diabetes with Ozempic  0.5 mg weekly for the past month after increasing from 0.25 mg, with associated weight loss. She also takes Jardiance  and metformin . She has not been checking blood sugars or blood pressure at home. She denies chest pain, shortness of breath, and numbness or tingling in her feet.   Her diet is generally lean meats with fruits and vegetables and she does not smoke. She attends water aerobics at the [facility] for regular physical activity.     Review of Systems See pertinent positives and negatives per HPI.     Objective:    BP 122/72 (BP Location: Left Arm, Patient Position: Sitting, Cuff Size: Normal)   Pulse 94   Temp (!) 97.3 F (36.3 C) (Oral)   Ht 5' (1.524 m)   Wt 192 lb 6.4 oz (87.3 kg)   LMP 08/27/2024 (Exact Date)   SpO2 97%   BMI 37.58 kg/m   BP Readings from Last 3 Encounters:  09/21/24 122/72  01/28/24 128/84  08/03/23 136/84   Wt Readings from Last 3 Encounters:  09/21/24 192 lb 6.4 oz (87.3 kg)  05/18/24 201 lb (91.2 kg)  01/28/24 206 lb 9.6 oz (93.7 kg)   Physical Exam Vitals and nursing note reviewed.  Constitutional:      General: She is not in acute distress.    Appearance: Normal  appearance.  HENT:     Head: Normocephalic.  Eyes:     Conjunctiva/sclera: Conjunctivae normal.  Cardiovascular:     Rate and Rhythm: Normal rate and regular rhythm.     Pulses: Normal pulses.     Heart sounds: Murmur heard.  Pulmonary:     Effort: Pulmonary effort is normal.     Breath sounds: Normal breath sounds.  Musculoskeletal:     Cervical back: Normal range of motion.  Skin:    General: Skin is warm.  Neurological:     General: No focal deficit present.     Mental Status: She is alert and oriented to person, place, and time.  Psychiatric:        Mood and Affect: Mood normal.        Behavior: Behavior normal.        Thought Content: Thought content normal.        Judgment: Judgment normal.    Diabetic Foot Exam - Simple   Simple Foot Form Visual Inspection No deformities, no ulcerations, no other skin breakdown bilaterally: Yes Sensation Testing Intact to touch and monofilament testing bilaterally: Yes Pulse Check Posterior Tibialis and Dorsalis pulse intact bilaterally: Yes Comments       Assessment & Plan:   Problem List Items Addressed This Visit       Cardiovascular and  Mediastinum   Essential hypertension - Primary   Chronic, stable. Continue Amlodipine  10mg  daily and losartan -hctz 50-12.5mg  daily to control blood pressure. Check CMP, CBC today.       Relevant Medications   aspirin 81 MG chewable tablet   amLODipine  (NORVASC ) 10 MG tablet   losartan -hydrochlorothiazide  (HYZAAR ) 50-12.5 MG tablet   spironolactone  (ALDACTONE ) 25 MG tablet   Other Relevant Orders   CBC with Differential/Platelet   Comprehensive metabolic panel with GFR     Endocrine   Type 2 diabetes mellitus without complications (HCC)   Chronic, stable. Her diabetes is well-controlled with current medications. Increased Ozempic  to 1 mg weekly for weight loss. Refilled continuous glucose monitor sensor, Jardiance  10mg  daily, and metformin  XR 500mg  BID prescriptions. Check CMP, CBC,  A1c, urine microalbumin today. Encouraged to continue a healthy diet and exercise, including water aerobics. Statin therapy was discussed but not initiated at this time. Will re-evaluate regularly and most likely will start statin at age 61. Follow-up in 3 months.       Relevant Medications   aspirin 81 MG chewable tablet   Semaglutide , 1 MG/DOSE, 4 MG/3ML SOPN   empagliflozin  (JARDIANCE ) 10 MG TABS tablet   losartan -hydrochlorothiazide  (HYZAAR ) 50-12.5 MG tablet   metFORMIN  (GLUCOPHAGE -XR) 500 MG 24 hr tablet   Other Relevant Orders   CBC with Differential/Platelet   Comprehensive metabolic panel with GFR   Hemoglobin A1c   Microalbumin / creatinine urine ratio   Hyperlipidemia associated with type 2 diabetes mellitus (HCC)   Chronic, stable. Check CMP, CBC, lipid panel today and treat based on results.       Relevant Medications   aspirin 81 MG chewable tablet   Semaglutide , 1 MG/DOSE, 4 MG/3ML SOPN   amLODipine  (NORVASC ) 10 MG tablet   empagliflozin  (JARDIANCE ) 10 MG TABS tablet   losartan -hydrochlorothiazide  (HYZAAR ) 50-12.5 MG tablet   metFORMIN  (GLUCOPHAGE -XR) 500 MG 24 hr tablet   spironolactone  (ALDACTONE ) 25 MG tablet   Other Relevant Orders   CBC with Differential/Platelet   Comprehensive metabolic panel with GFR   Lipid panel     Other   Long term current use of oral hypoglycemic drug   Continue metformin  XR 500 mg twice a day and jardiance  10mg  daily      Long-term current use of injectable noninsulin antidiabetic medication   Increase ozempic  to 1mg  weekly      Generalized anxiety disorder   Chronic, stable. She is talking with a therapist, recommend she continue this. Discussed meditation, deep breathing, and drinking camomile or herbal tea in the evening. Follow-up with any concerns. Check TSH and vitamin D  today.       Relevant Orders   TSH   VITAMIN D  25 Hydroxy (Vit-D Deficiency, Fractures)    Tinnie DELENA Harada, NP  I,Emily Lagle,acting as a scribe  for Raea Magallon A Fayetta Sorenson, NP.,have documented all relevant documentation on the behalf of Pressley Tadesse DELENA Harada, NP.  I, Tinnie DELENA Harada, NP, have reviewed all documentation for this visit. The documentation on 09/21/2024 for the exam, diagnosis, procedures, and orders are all accurate and complete.  "

## 2024-09-21 NOTE — Assessment & Plan Note (Signed)
 Chronic, stable. Her diabetes is well-controlled with current medications. Increased Ozempic  to 1 mg weekly for weight loss. Refilled continuous glucose monitor sensor, Jardiance  10mg  daily, and metformin  XR 500mg  BID prescriptions. Check CMP, CBC, A1c, urine microalbumin today. Encouraged to continue a healthy diet and exercise, including water aerobics. Statin therapy was discussed but not initiated at this time. Will re-evaluate regularly and most likely will start statin at age 19. Follow-up in 3 months.

## 2024-09-22 ENCOUNTER — Other Ambulatory Visit (HOSPITAL_COMMUNITY): Payer: Self-pay

## 2024-09-22 ENCOUNTER — Ambulatory Visit: Payer: Self-pay | Admitting: Nurse Practitioner

## 2024-09-22 ENCOUNTER — Encounter: Payer: Self-pay | Admitting: Pharmacy Technician

## 2024-09-22 ENCOUNTER — Other Ambulatory Visit (HOSPITAL_BASED_OUTPATIENT_CLINIC_OR_DEPARTMENT_OTHER): Payer: Self-pay

## 2024-09-22 ENCOUNTER — Other Ambulatory Visit: Payer: Self-pay

## 2024-09-22 MED ORDER — VITAMIN D (ERGOCALCIFEROL) 1.25 MG (50000 UNIT) PO CAPS
50000.0000 [IU] | ORAL_CAPSULE | ORAL | 0 refills | Status: AC
  Filled 2024-09-22: qty 12, 84d supply, fill #0

## 2024-09-23 ENCOUNTER — Other Ambulatory Visit (HOSPITAL_COMMUNITY): Payer: Self-pay

## 2024-09-29 ENCOUNTER — Ambulatory Visit: Admitting: Nurse Practitioner
